# Patient Record
Sex: Male | Born: 1965 | Race: White | Hispanic: No | Marital: Single | Smoking: Former smoker
Health system: Southern US, Community
[De-identification: ages and names within clinical notes are randomized; demographics above are authoritative.]

## PROBLEM LIST (undated history)

## (undated) DIAGNOSIS — J449 Chronic obstructive pulmonary disease, unspecified: Secondary | ICD-10-CM

## (undated) DIAGNOSIS — R519 Headache, unspecified: Secondary | ICD-10-CM

## (undated) DIAGNOSIS — I10 Essential (primary) hypertension: Secondary | ICD-10-CM

## (undated) DIAGNOSIS — E785 Hyperlipidemia, unspecified: Secondary | ICD-10-CM

## (undated) DIAGNOSIS — K219 Gastro-esophageal reflux disease without esophagitis: Secondary | ICD-10-CM

## (undated) DIAGNOSIS — R0600 Dyspnea, unspecified: Secondary | ICD-10-CM

## (undated) DIAGNOSIS — G4730 Sleep apnea, unspecified: Secondary | ICD-10-CM

## (undated) HISTORY — PX: HERNIA REPAIR: SHX51

## (undated) HISTORY — PX: TONSILLECTOMY: SUR1361

---

## 2017-11-15 ENCOUNTER — Emergency Department (HOSPITAL_COMMUNITY)
Admission: EM | Admit: 2017-11-15 | Discharge: 2017-11-15 | Disposition: A | Discharge status: Home / Self Care | Payer: Medicaid Other | Attending: Emergency Medicine | Admitting: Emergency Medicine

## 2017-11-15 ENCOUNTER — Other Ambulatory Visit: Payer: Self-pay

## 2017-11-15 ENCOUNTER — Encounter (HOSPITAL_COMMUNITY): Payer: Self-pay

## 2017-11-15 ENCOUNTER — Emergency Department (HOSPITAL_COMMUNITY): Payer: Medicaid Other

## 2017-11-15 DIAGNOSIS — I10 Essential (primary) hypertension: Secondary | ICD-10-CM | POA: Diagnosis not present

## 2017-11-15 DIAGNOSIS — J449 Chronic obstructive pulmonary disease, unspecified: Secondary | ICD-10-CM | POA: Diagnosis not present

## 2017-11-15 DIAGNOSIS — R0602 Shortness of breath: Secondary | ICD-10-CM | POA: Diagnosis not present

## 2017-11-15 DIAGNOSIS — R51 Headache: Secondary | ICD-10-CM

## 2017-11-15 DIAGNOSIS — G8929 Other chronic pain: Secondary | ICD-10-CM

## 2017-11-15 DIAGNOSIS — Z79899 Other long term (current) drug therapy: Secondary | ICD-10-CM | POA: Insufficient documentation

## 2017-11-15 DIAGNOSIS — F17210 Nicotine dependence, cigarettes, uncomplicated: Secondary | ICD-10-CM | POA: Diagnosis not present

## 2017-11-15 HISTORY — DX: Chronic obstructive pulmonary disease, unspecified: J44.9

## 2017-11-15 HISTORY — DX: Sleep apnea, unspecified: G47.30

## 2017-11-15 HISTORY — DX: Essential (primary) hypertension: I10

## 2017-11-15 HISTORY — DX: Hyperlipidemia, unspecified: E78.5

## 2017-11-15 LAB — COMPREHENSIVE METABOLIC PANEL
ALBUMIN: 4.5 g/dL (ref 3.5–5.0)
ALT: 19 U/L (ref 0–44)
AST: 18 U/L (ref 15–41)
Alkaline Phosphatase: 69 U/L (ref 38–126)
Anion gap: 8 (ref 5–15)
BILIRUBIN TOTAL: 1.3 mg/dL — AB (ref 0.3–1.2)
BUN: 14 mg/dL (ref 6–20)
CO2: 24 mmol/L (ref 22–32)
Calcium: 9.1 mg/dL (ref 8.9–10.3)
Chloride: 108 mmol/L (ref 98–111)
Creatinine, Ser: 0.86 mg/dL (ref 0.61–1.24)
GFR calc Af Amer: >60 mL/min (ref >60)
GLUCOSE: 89 mg/dL (ref 70–99)
POTASSIUM: 3.5 mmol/L (ref 3.5–5.1)
Sodium: 140 mmol/L (ref 135–145)
TOTAL PROTEIN: 7.5 g/dL (ref 6.5–8.1)

## 2017-11-15 LAB — CBC WITH DIFFERENTIAL/PLATELET
ABS IMMATURE GRANULOCYTES: 0.01 10*3/uL (ref 0.00–0.07)
BASOS ABS: 0.1 10*3/uL (ref 0.0–0.1)
Basophils Relative: 1 %
EOS ABS: 0.1 10*3/uL (ref 0.0–0.5)
Eosinophils Relative: 1 %
HEMATOCRIT: 51.2 % (ref 39.0–52.0)
Hemoglobin: 17.4 g/dL — ABNORMAL HIGH (ref 13.0–17.0)
IMMATURE GRANULOCYTES: 0 %
LYMPHS ABS: 2 10*3/uL (ref 0.7–4.0)
LYMPHS PCT: 28 %
MCH: 30.4 pg (ref 26.0–34.0)
MCHC: 34 g/dL (ref 30.0–36.0)
MCV: 89.4 fL (ref 80.0–100.0)
MONOS PCT: 10 %
Monocytes Absolute: 0.7 10*3/uL (ref 0.1–1.0)
NEUTROS ABS: 4.3 10*3/uL (ref 1.7–7.7)
NEUTROS PCT: 60 %
NRBC: 0 % (ref 0.0–0.2)
Platelets: 250 10*3/uL (ref 150–400)
RBC: 5.73 MIL/uL (ref 4.22–5.81)
RDW: 12.4 % (ref 11.5–15.5)
WBC: 7.1 10*3/uL (ref 4.0–10.5)

## 2017-11-15 LAB — TROPONIN I: Troponin I: <0.03 ng/mL (ref <0.03)

## 2017-11-15 LAB — BRAIN NATRIURETIC PEPTIDE: B NATRIURETIC PEPTIDE 5: 29 pg/mL (ref 0.0–100.0)

## 2017-11-15 MED ORDER — ONDANSETRON HCL 4 MG/2ML IJ SOLN
INTRAMUSCULAR | Status: AC
  Administered 2017-11-15: 16:00:00
  Filled 2017-11-15: qty 2

## 2017-11-15 MED ORDER — LISINOPRIL 20 MG PO TABS: 20.0000 mg | ORAL_TABLET | Freq: Every evening | ORAL | 0 refills | Status: DC

## 2017-11-15 MED ORDER — METOPROLOL SUCCINATE ER 25 MG PO TB24: 25.0000 mg | ORAL_TABLET | Freq: Every day | ORAL | 0 refills | Status: DC

## 2017-11-15 MED ORDER — LABETALOL HCL 5 MG/ML IV SOLN
10.0000 mg | Freq: Once | INTRAVENOUS | Status: AC
  Administered 2017-11-15: 10 mg via INTRAVENOUS
  Filled 2017-11-15: qty 4

## 2017-11-15 MED ORDER — KETOROLAC TROMETHAMINE 30 MG/ML IJ SOLN: 60.0000 mg | Freq: Once | INTRAMUSCULAR | Status: DC

## 2017-11-15 MED ORDER — ONDANSETRON HCL 4 MG PO TABS: 4.0000 mg | ORAL_TABLET | Freq: Four times a day (QID) | ORAL | 0 refills | Status: DC | PRN

## 2017-11-15 MED ORDER — KETOROLAC TROMETHAMINE 60 MG/2ML IM SOLN
60.0000 mg | Freq: Once | INTRAMUSCULAR | Status: AC
  Administered 2017-11-15: 60 mg via INTRAMUSCULAR
  Filled 2017-11-15: qty 2

## 2017-11-15 NOTE — ED Provider Notes (Signed)
Asked to see the patient prior to being discharged as the patient felt like he still had a headache, still having some intermittent left leg pain and still worried that his blood pressure was high.  I had a long discussion with the patient and the family members regarding the etiology of his blood pressure being elevated including his noncompliance with medications for over a month, his 6 pack/day smoking history which is only recently improved to 1-1/2 packs/day, as well as his lifestyle.  He reports that he is chronically short of breath from his COPD which has not worsened, but he does not have any chest pain, his headache is intermittent but has been for weeks to months.  He is aware that his blood pressure is been high for quite some time.  The family members are asking why we are not admitting him to the hospital until his blood pressure is back to normal.  I had a long discussion with the family about the utilization of medications to slowly lower his blood pressure over time to reduce the risk of acute hypotension and hypoperfusion of the cerebrum.  They expressed her understanding as well as her understanding of the indications for return including signs or symptoms of stroke and heart attack.  He was given reassurance of his blood work and his work-up here today, he is given prescriptions for both lisinopril metoprolol and a nausea medication.  I have cautioned him against smoking alcohol use and anti-inflammatory use as they are pro hypertensive.  He again expressed his understanding is ambulatory and stable for discharge   Noemi Chapel, MD 11/15/17 1644

## 2017-11-15 NOTE — ED Notes (Signed)
Pt and family questioning discharge.  Reports bp still elevated and still c/o headache and left leg pain.  Notified pt's primary RN.
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
## 2017-11-15 NOTE — ED Triage Notes (Signed)
Pt reports that he went to clinic today for refill of BP meds. Pt has recently relocated and has not had BP meds since sept. Pt sent here due to BP 200/116 and was told he may have had a mild heart attack at some point

## 2018-02-14 ENCOUNTER — Telehealth: Payer: Self-pay

## 2018-02-14 ENCOUNTER — Institutional Professional Consult (permissible substitution): Payer: Medicaid Other | Admitting: Neurology

## 2018-02-14 NOTE — Telephone Encounter (Signed)
Pt did not show for their appt with Dr. Athar today.  

## 2018-02-19 ENCOUNTER — Encounter: Payer: Self-pay | Admitting: Neurology

## 2018-03-25 NOTE — Telephone Encounter (Signed)
Please follow dismissal protocol as per our No Show Policy for new pts.

## 2018-03-25 NOTE — Telephone Encounter (Signed)
Pt cancelled within 24 hours of his appt tomorrow. This counts as a no show. Pt has had 2 no shows this year. Per [facility] policy, pt meets dismissal criteria. Will send to Dr. Rexene Alberts for review.

## 2018-03-26 ENCOUNTER — Encounter: Payer: Self-pay | Admitting: Neurology

## 2018-03-26 ENCOUNTER — Institutional Professional Consult (permissible substitution): Payer: Medicaid Other | Admitting: Neurology

## 2018-12-16 ENCOUNTER — Other Ambulatory Visit (HOSPITAL_BASED_OUTPATIENT_CLINIC_OR_DEPARTMENT_OTHER): Payer: Self-pay

## 2018-12-16 DIAGNOSIS — I10 Essential (primary) hypertension: Secondary | ICD-10-CM

## 2018-12-16 DIAGNOSIS — G4733 Obstructive sleep apnea (adult) (pediatric): Secondary | ICD-10-CM

## 2018-12-16 DIAGNOSIS — G2581 Restless legs syndrome: Secondary | ICD-10-CM

## 2018-12-23 ENCOUNTER — Other Ambulatory Visit (HOSPITAL_COMMUNITY)
Admission: RE | Admit: 2018-12-23 | Discharge: 2018-12-23 | Disposition: A | Discharge status: Home / Self Care | Payer: Medicaid Other | Source: Ambulatory Visit | Attending: Neurology | Admitting: Neurology

## 2018-12-23 ENCOUNTER — Other Ambulatory Visit: Payer: Self-pay

## 2018-12-23 DIAGNOSIS — Z01812 Encounter for preprocedural laboratory examination: Secondary | ICD-10-CM | POA: Diagnosis present

## 2018-12-23 DIAGNOSIS — Z20828 Contact with and (suspected) exposure to other viral communicable diseases: Secondary | ICD-10-CM | POA: Insufficient documentation

## 2018-12-23 LAB — SARS CORONAVIRUS 2 (TAT 6-24 HRS): SARS Coronavirus 2: NEGATIVE

## 2018-12-25 ENCOUNTER — Other Ambulatory Visit: Payer: Self-pay

## 2018-12-25 ENCOUNTER — Ambulatory Visit: Payer: Medicaid Other | Attending: Neurology | Admitting: Neurology

## 2018-12-25 DIAGNOSIS — Z79899 Other long term (current) drug therapy: Secondary | ICD-10-CM | POA: Insufficient documentation

## 2018-12-25 DIAGNOSIS — R0683 Snoring: Secondary | ICD-10-CM | POA: Insufficient documentation

## 2018-12-25 DIAGNOSIS — G2581 Restless legs syndrome: Secondary | ICD-10-CM | POA: Diagnosis not present

## 2018-12-25 DIAGNOSIS — G4733 Obstructive sleep apnea (adult) (pediatric): Secondary | ICD-10-CM | POA: Diagnosis present

## 2018-12-25 DIAGNOSIS — I10 Essential (primary) hypertension: Secondary | ICD-10-CM | POA: Insufficient documentation

## 2018-12-31 ENCOUNTER — Telehealth: Payer: Self-pay | Admitting: General Practice

## 2018-12-31 NOTE — Telephone Encounter (Signed)
Negative COVID results given. Patient results "NOT Detected." Caller expressed understanding. ° °

## 2019-01-14 NOTE — Procedures (Signed)
[name], MD     [email or web address]             NOCTURNAL POLYSOMNOGRAPHY   LOCATION: [facility]   Patient Name: Jerry Aguirre, Jerry Aguirre Date: 12/25/2018 Gender: Male D.O.B: [birth year] Age (years): 51 Referring Provider: Phillips Odor MD, ABSM Height (inches): 64 Interpreting Physician: Phillips Odor MD, ABSM Weight (lbs): 163 RPSGT: Peak, Robert BMI: 28 MRN: [id number] Neck Size: 16.00 CLINICAL INFORMATION Sleep Study Type: NPSG     Indication for sleep study: Hypertension, Witnesses Apnea / Gasping During Sleep     Epworth Sleepiness Score: 1     SLEEP STUDY TECHNIQUE As per the AASM Manual for the Scoring of Sleep and Associated Events v2.3 (April 2016) with a hypopnea requiring 4% desaturations.  The channels recorded and monitored were frontal, central and occipital EEG, electrooculogram (EOG), submentalis EMG (chin), nasal and oral airflow, thoracic and abdominal wall motion, anterior tibialis EMG, snore microphone, electrocardiogram, and pulse oximetry.  MEDICATIONS Medications self-administered by patient taken the night of the study : N/A  Current Outpatient Medications:  .  albuterol (PROVENTIL HFA;VENTOLIN HFA) 108 (90 Base) MCG/ACT inhaler, Inhale 2 puffs into the lungs every 6 (six) hours as needed for wheezing or shortness of breath., Disp: , Rfl:  .  amLODipine (NORVASC) 10 MG tablet, Take 10 mg by mouth daily., Disp: , Rfl:  .  atorvastatin (LIPITOR) 20 MG tablet, Take 20 mg by mouth daily., Disp: , Rfl: 0 .  furosemide (LASIX) 40 MG tablet, Take 40 mg by mouth daily., Disp: , Rfl:  .  lisinopril (PRINIVIL,ZESTRIL) 20 MG tablet, Take 1 tablet (20 mg total) by mouth every evening., Disp: 30 tablet, Rfl: 0 .  meloxicam (MOBIC) 7.5 MG tablet, Take 7.5 mg by mouth 2 (two) times daily., Disp: , Rfl:  .  metoprolol succinate (TOPROL-XL) 25 MG 24 hr tablet, Take 1 tablet (25 mg total) by mouth at bedtime., Disp: 30  tablet, Rfl: 0 .  naproxen (NAPROSYN) 500 MG tablet, Take 500 mg by mouth 2 (two) times daily as needed. for pain, Disp: , Rfl: 0 .  omeprazole (PRILOSEC) 40 MG capsule, Take 40 mg by mouth daily., Disp: , Rfl: 0 .  ondansetron (ZOFRAN) 4 MG tablet, Take 1 tablet (4 mg total) by mouth every 6 (six) hours as needed., Disp: 12 tablet, Rfl: 0 .  pantoprazole (PROTONIX) 40 MG tablet, Take 40 mg by mouth daily., Disp: , Rfl:  .  triamterene-hydrochlorothiazide (DYAZIDE) 37.5-25 MG capsule, Take 1 capsule by mouth daily., Disp: , Rfl:      SLEEP ARCHITECTURE The study was initiated at 9:44:26 PM and ended at 4:52:36 AM.  Sleep onset time was 81.0 minutes and the sleep efficiency was 75.7%%. The total sleep time was 324 minutes.  Stage REM latency was 134.0 minutes.  The patient spent 7.3%% of the night in stage N1 sleep, 85.3%% in stage N2 sleep, 0.0%% in stage N3 and 7.4% in REM.  Alpha intrusion was absent.  Supine sleep was 49.23%.  RESPIRATORY PARAMETERS The overall apnea/hypopnea index (AHI) was 3.1 per hour. There were 0 total apneas, including 0 obstructive, 0 central and 0 mixed apneas. There were 17 hypopneas and 38 RERAs.  The AHI during Stage REM sleep was 2.5 per hour.  AHI while supine was 4.1 per hour.  The mean oxygen saturation was 89.1%. The minimum SpO2 during sleep was 76.0%.  loud snoring was noted during this study.  CARDIAC DATA The 2 lead  EKG demonstrated sinus rhythm. The mean heart rate was 69.6 beats per minute. Other EKG findings include: None. LEG MOVEMENT DATA The total PLMS were 0 with a resulting PLMS index of 0.0. Associated arousal with leg movement index was 0.0.  IMPRESSIONS 1. No significant obstructive sleep apnea occurred during this study. 2. No significant central sleep apnea occurred during this study. 3. Absent slow-wave sleep is documented.  Delano Metz, MD Diplomate, American Board of Sleep Medicine.  ELECTRONICALLY SIGNED ON:   01/14/2019, 6:57 AM [facility] PH: [phone]   FX: [phone] [place]

## 2019-03-06 DIAGNOSIS — R5383 Other fatigue: Secondary | ICD-10-CM | POA: Insufficient documentation

## 2019-05-01 DIAGNOSIS — G4700 Insomnia, unspecified: Secondary | ICD-10-CM | POA: Insufficient documentation

## 2019-08-20 ENCOUNTER — Other Ambulatory Visit (HOSPITAL_COMMUNITY): Payer: Self-pay | Admitting: Neurology

## 2019-08-20 DIAGNOSIS — M545 Low back pain, unspecified: Secondary | ICD-10-CM

## 2019-08-20 DIAGNOSIS — M25551 Pain in right hip: Secondary | ICD-10-CM | POA: Insufficient documentation

## 2019-08-20 DIAGNOSIS — M25561 Pain in right knee: Secondary | ICD-10-CM

## 2019-08-20 DIAGNOSIS — M25552 Pain in left hip: Secondary | ICD-10-CM

## 2019-08-20 DIAGNOSIS — M25562 Pain in left knee: Secondary | ICD-10-CM

## 2019-10-21 DIAGNOSIS — R2689 Other abnormalities of gait and mobility: Secondary | ICD-10-CM | POA: Insufficient documentation

## 2019-11-20 DIAGNOSIS — R600 Localized edema: Secondary | ICD-10-CM | POA: Insufficient documentation

## 2020-03-08 ENCOUNTER — Other Ambulatory Visit (HOSPITAL_COMMUNITY): Payer: Self-pay | Admitting: Neurology

## 2020-03-08 DIAGNOSIS — M25551 Pain in right hip: Secondary | ICD-10-CM

## 2020-03-15 ENCOUNTER — Ambulatory Visit (HOSPITAL_COMMUNITY)
Admission: RE | Admit: 2020-03-15 | Discharge: 2020-03-15 | Disposition: A | Discharge status: Home / Self Care | Payer: Medicaid Other | Source: Ambulatory Visit | Attending: Neurology | Admitting: Neurology

## 2020-03-15 ENCOUNTER — Other Ambulatory Visit: Payer: Self-pay

## 2020-03-15 ENCOUNTER — Other Ambulatory Visit (HOSPITAL_COMMUNITY): Payer: Self-pay | Admitting: Neurology

## 2020-03-15 DIAGNOSIS — M25551 Pain in right hip: Secondary | ICD-10-CM | POA: Insufficient documentation

## 2020-03-15 DIAGNOSIS — M25561 Pain in right knee: Secondary | ICD-10-CM | POA: Insufficient documentation

## 2020-03-15 DIAGNOSIS — M545 Low back pain, unspecified: Secondary | ICD-10-CM | POA: Diagnosis present

## 2020-03-15 DIAGNOSIS — M25552 Pain in left hip: Secondary | ICD-10-CM | POA: Insufficient documentation

## 2020-03-15 DIAGNOSIS — M25562 Pain in left knee: Secondary | ICD-10-CM | POA: Insufficient documentation

## 2020-04-08 ENCOUNTER — Other Ambulatory Visit (HOSPITAL_COMMUNITY): Payer: Self-pay | Admitting: Neurology

## 2020-04-08 DIAGNOSIS — R2689 Other abnormalities of gait and mobility: Secondary | ICD-10-CM

## 2020-04-09 ENCOUNTER — Other Ambulatory Visit (HOSPITAL_COMMUNITY): Payer: Self-pay | Admitting: Neurology

## 2020-04-09 DIAGNOSIS — M5000 Cervical disc disorder with myelopathy, unspecified cervical region: Secondary | ICD-10-CM

## 2020-04-25 DIAGNOSIS — M5136 Other intervertebral disc degeneration, lumbar region: Secondary | ICD-10-CM | POA: Insufficient documentation

## 2020-04-28 ENCOUNTER — Ambulatory Visit (HOSPITAL_COMMUNITY)
Admission: RE | Admit: 2020-04-28 | Discharge: 2020-04-28 | Disposition: A | Discharge status: Home / Self Care | Payer: Medicaid Other | Source: Ambulatory Visit | Attending: Neurology | Admitting: Neurology

## 2020-04-28 DIAGNOSIS — M5000 Cervical disc disorder with myelopathy, unspecified cervical region: Secondary | ICD-10-CM

## 2020-04-28 DIAGNOSIS — R2689 Other abnormalities of gait and mobility: Secondary | ICD-10-CM | POA: Insufficient documentation

## 2020-06-01 DIAGNOSIS — Z79891 Long term (current) use of opiate analgesic: Secondary | ICD-10-CM | POA: Insufficient documentation

## 2020-07-21 ENCOUNTER — Encounter (HOSPITAL_COMMUNITY): Payer: Self-pay

## 2020-07-21 ENCOUNTER — Observation Stay (HOSPITAL_COMMUNITY): Payer: Medicaid Other

## 2020-07-21 ENCOUNTER — Other Ambulatory Visit: Payer: Self-pay

## 2020-07-21 ENCOUNTER — Observation Stay (HOSPITAL_COMMUNITY)
Admission: EM | Admit: 2020-07-21 | Discharge: 2020-07-22 | Disposition: A | Discharge status: Home / Self Care | Payer: Medicaid Other | Attending: Internal Medicine | Admitting: Internal Medicine

## 2020-07-21 ENCOUNTER — Emergency Department (HOSPITAL_COMMUNITY): Payer: Medicaid Other

## 2020-07-21 DIAGNOSIS — R202 Paresthesia of skin: Secondary | ICD-10-CM

## 2020-07-21 DIAGNOSIS — R471 Dysarthria and anarthria: Secondary | ICD-10-CM

## 2020-07-21 DIAGNOSIS — Z20822 Contact with and (suspected) exposure to covid-19: Secondary | ICD-10-CM | POA: Diagnosis not present

## 2020-07-21 DIAGNOSIS — R404 Transient alteration of awareness: Secondary | ICD-10-CM

## 2020-07-21 DIAGNOSIS — R29818 Other symptoms and signs involving the nervous system: Secondary | ICD-10-CM | POA: Diagnosis not present

## 2020-07-21 DIAGNOSIS — F17210 Nicotine dependence, cigarettes, uncomplicated: Secondary | ICD-10-CM | POA: Insufficient documentation

## 2020-07-21 DIAGNOSIS — R7303 Prediabetes: Secondary | ICD-10-CM

## 2020-07-21 DIAGNOSIS — Z79899 Other long term (current) drug therapy: Secondary | ICD-10-CM | POA: Insufficient documentation

## 2020-07-21 DIAGNOSIS — I10 Essential (primary) hypertension: Secondary | ICD-10-CM

## 2020-07-21 DIAGNOSIS — J449 Chronic obstructive pulmonary disease, unspecified: Secondary | ICD-10-CM

## 2020-07-21 DIAGNOSIS — E7439 Other disorders of intestinal carbohydrate absorption: Secondary | ICD-10-CM

## 2020-07-21 DIAGNOSIS — E785 Hyperlipidemia, unspecified: Secondary | ICD-10-CM

## 2020-07-21 DIAGNOSIS — Y900 Blood alcohol level of less than 20 mg/100 ml: Secondary | ICD-10-CM | POA: Insufficient documentation

## 2020-07-21 DIAGNOSIS — K219 Gastro-esophageal reflux disease without esophagitis: Secondary | ICD-10-CM

## 2020-07-21 DIAGNOSIS — I639 Cerebral infarction, unspecified: Secondary | ICD-10-CM | POA: Diagnosis not present

## 2020-07-21 DIAGNOSIS — R4781 Slurred speech: Secondary | ICD-10-CM | POA: Diagnosis present

## 2020-07-21 LAB — I-STAT CHEM 8, ED
BUN: 17 mg/dL (ref 6–20)
Calcium, Ion: 1.24 mmol/L (ref 1.15–1.40)
Chloride: 101 mmol/L (ref 98–111)
Creatinine, Ser: 1.5 mg/dL — ABNORMAL HIGH (ref 0.61–1.24)
Glucose, Bld: 102 mg/dL — ABNORMAL HIGH (ref 70–99)
HCT: 50 % (ref 39.0–52.0)
Hemoglobin: 17 g/dL (ref 13.0–17.0)
Potassium: 3.8 mmol/L (ref 3.5–5.1)
Sodium: 140 mmol/L (ref 135–145)
TCO2: 31 mmol/L (ref 22–32)

## 2020-07-21 LAB — CBG MONITORING, ED: Glucose-Capillary: 115 mg/dL — ABNORMAL HIGH (ref 70–99)

## 2020-07-21 LAB — COMPREHENSIVE METABOLIC PANEL
ALT: 26 U/L (ref 0–44)
AST: 22 U/L (ref 15–41)
Albumin: 4.4 g/dL (ref 3.5–5.0)
Alkaline Phosphatase: 67 U/L (ref 38–126)
Anion gap: 12 (ref 5–15)
BUN: 18 mg/dL (ref 6–20)
CO2: 25 mmol/L (ref 22–32)
Calcium: 9.5 mg/dL (ref 8.9–10.3)
Chloride: 101 mmol/L (ref 98–111)
Creatinine, Ser: 1.28 mg/dL — ABNORMAL HIGH (ref 0.61–1.24)
GFR, Estimated: >60 mL/min (ref >60)
Glucose, Bld: 106 mg/dL — ABNORMAL HIGH (ref 70–99)
Potassium: 3.8 mmol/L (ref 3.5–5.1)
Sodium: 138 mmol/L (ref 135–145)
Total Bilirubin: 1.7 mg/dL — ABNORMAL HIGH (ref 0.3–1.2)
Total Protein: 7.8 g/dL (ref 6.5–8.1)

## 2020-07-21 LAB — CBC
HCT: 49.2 % (ref 39.0–52.0)
Hemoglobin: 16.7 g/dL (ref 13.0–17.0)
MCH: 31.3 pg (ref 26.0–34.0)
MCHC: 33.9 g/dL (ref 30.0–36.0)
MCV: 92.3 fL (ref 80.0–100.0)
Platelets: 228 10*3/uL (ref 150–400)
RBC: 5.33 MIL/uL (ref 4.22–5.81)
RDW: 12.7 % (ref 11.5–15.5)
WBC: 8.3 10*3/uL (ref 4.0–10.5)
nRBC: 0 % (ref 0.0–0.2)

## 2020-07-21 LAB — DIFFERENTIAL
Abs Immature Granulocytes: 0.02 10*3/uL (ref 0.00–0.07)
Basophils Absolute: 0.1 10*3/uL (ref 0.0–0.1)
Basophils Relative: 1 %
Eosinophils Absolute: 0.2 10*3/uL (ref 0.0–0.5)
Eosinophils Relative: 2 %
Immature Granulocytes: 0 %
Lymphocytes Relative: 24 %
Lymphs Abs: 2 10*3/uL (ref 0.7–4.0)
Monocytes Absolute: 0.8 10*3/uL (ref 0.1–1.0)
Monocytes Relative: 10 %
Neutro Abs: 5.2 10*3/uL (ref 1.7–7.7)
Neutrophils Relative %: 63 %

## 2020-07-21 LAB — PROTIME-INR
INR: 0.9 (ref 0.8–1.2)
Prothrombin Time: 11.9 seconds (ref 11.4–15.2)

## 2020-07-21 LAB — RESP PANEL BY RT-PCR (FLU A&B, COVID) ARPGX2
Influenza A by PCR: NEGATIVE
Influenza B by PCR: NEGATIVE
SARS Coronavirus 2 by RT PCR: NEGATIVE

## 2020-07-21 LAB — APTT: aPTT: 28 seconds (ref 24–36)

## 2020-07-21 LAB — ETHANOL: Alcohol, Ethyl (B): <10 mg/dL (ref <10)

## 2020-07-21 MED ORDER — IPRATROPIUM-ALBUTEROL 0.5-2.5 (3) MG/3ML IN SOLN: 3.0000 mL | RESPIRATORY_TRACT | Status: DC | PRN

## 2020-07-21 MED ORDER — ALBUTEROL SULFATE (2.5 MG/3ML) 0.083% IN NEBU: 3.0000 mL | INHALATION_SOLUTION | Freq: Four times a day (QID) | RESPIRATORY_TRACT | Status: DC | PRN

## 2020-07-21 MED ORDER — IOHEXOL 350 MG/ML SOLN
100.0000 mL | Freq: Once | INTRAVENOUS | Status: AC | PRN
  Administered 2020-07-21: 100 mL via INTRAVENOUS

## 2020-07-21 MED ORDER — SODIUM CHLORIDE 0.9 % IV SOLN: INTRAVENOUS | Status: DC

## 2020-07-21 MED ORDER — ACETAMINOPHEN 160 MG/5ML PO SOLN: 650.0000 mg | ORAL | Status: DC | PRN

## 2020-07-21 MED ORDER — ATORVASTATIN CALCIUM 20 MG PO TABS
20.0000 mg | ORAL_TABLET | Freq: Every day | ORAL | Status: DC
  Administered 2020-07-21: 20 mg via ORAL
  Filled 2020-07-21: qty 1

## 2020-07-21 MED ORDER — SENNOSIDES-DOCUSATE SODIUM 8.6-50 MG PO TABS: 1.0000 | ORAL_TABLET | Freq: Every evening | ORAL | Status: DC | PRN

## 2020-07-21 MED ORDER — HEPARIN SODIUM (PORCINE) 5000 UNIT/ML IJ SOLN
5000.0000 [IU] | Freq: Three times a day (TID) | INTRAMUSCULAR | Status: DC
  Administered 2020-07-21 – 2020-07-22 (×3): 5000 [IU] via SUBCUTANEOUS
  Filled 2020-07-21 (×2): qty 1

## 2020-07-21 MED ORDER — STROKE: EARLY STAGES OF RECOVERY BOOK: Freq: Once | Status: DC

## 2020-07-21 MED ORDER — ACETAMINOPHEN 650 MG RE SUPP: 650.0000 mg | RECTAL | Status: DC | PRN

## 2020-07-21 MED ORDER — ACETAMINOPHEN 325 MG PO TABS: 650.0000 mg | ORAL_TABLET | ORAL | Status: DC | PRN

## 2020-07-21 MED ORDER — PANTOPRAZOLE SODIUM 40 MG PO TBEC
40.0000 mg | DELAYED_RELEASE_TABLET | Freq: Every day | ORAL | Status: DC
  Administered 2020-07-21 – 2020-07-22 (×2): 40 mg via ORAL
  Filled 2020-07-21 (×2): qty 1

## 2020-07-21 MED ORDER — ASPIRIN 81 MG PO CHEW
324.0000 mg | CHEWABLE_TABLET | Freq: Once | ORAL | Status: AC
  Administered 2020-07-21: 324 mg via ORAL
  Filled 2020-07-21: qty 4

## 2020-07-21 MED ORDER — ASPIRIN EC 81 MG PO TBEC
81.0000 mg | DELAYED_RELEASE_TABLET | Freq: Every day | ORAL | Status: DC
  Administered 2020-07-22: 81 mg via ORAL
  Filled 2020-07-21: qty 1
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
## 2020-07-21 NOTE — ED Notes (Signed)
Pt given a urine at this time.
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
## 2020-07-21 NOTE — ED Triage Notes (Signed)
Pt to er room number 18 via ems, per ems pt is here for some facial droop and fatigue, states that family found him slumped over and was hard to wake up.  Pt states that his nephew came to see him and thought that he had a facial droop, pt states that nothing is wrong, he just feels tired .

## 2020-07-22 ENCOUNTER — Observation Stay (HOSPITAL_COMMUNITY): Payer: Medicaid Other

## 2020-07-22 ENCOUNTER — Observation Stay (HOSPITAL_BASED_OUTPATIENT_CLINIC_OR_DEPARTMENT_OTHER): Payer: Medicaid Other

## 2020-07-22 DIAGNOSIS — E785 Hyperlipidemia, unspecified: Secondary | ICD-10-CM | POA: Diagnosis not present

## 2020-07-22 DIAGNOSIS — G459 Transient cerebral ischemic attack, unspecified: Secondary | ICD-10-CM | POA: Diagnosis not present

## 2020-07-22 DIAGNOSIS — I10 Essential (primary) hypertension: Secondary | ICD-10-CM | POA: Diagnosis not present

## 2020-07-22 DIAGNOSIS — E7439 Other disorders of intestinal carbohydrate absorption: Secondary | ICD-10-CM

## 2020-07-22 DIAGNOSIS — R7303 Prediabetes: Secondary | ICD-10-CM

## 2020-07-22 DIAGNOSIS — J449 Chronic obstructive pulmonary disease, unspecified: Secondary | ICD-10-CM | POA: Diagnosis not present

## 2020-07-22 LAB — HEMOGLOBIN A1C
Hgb A1c MFr Bld: 6.1 % — ABNORMAL HIGH (ref 4.8–5.6)
Mean Plasma Glucose: 128.37 mg/dL

## 2020-07-22 LAB — ECHOCARDIOGRAM COMPLETE
AR max vel: 2.85 cm2
AV Area VTI: 2.62 cm2
AV Area mean vel: 2.68 cm2
AV Mean grad: 4 mmHg
AV Peak grad: 6.7 mmHg
Ao pk vel: 1.29 m/s
Area-P 1/2: 3.2 cm2
Height: 67 in
MV VTI: 3.43 cm2
S' Lateral: 3.47 cm
Weight: 2663.16 oz

## 2020-07-22 LAB — LIPID PANEL
Cholesterol: 181 mg/dL (ref 0–200)
HDL: 31 mg/dL — ABNORMAL LOW (ref >40)
LDL Cholesterol: 116 mg/dL — ABNORMAL HIGH (ref 0–99)
Total CHOL/HDL Ratio: 5.8 RATIO
Triglycerides: 169 mg/dL — ABNORMAL HIGH (ref <150)
VLDL: 34 mg/dL (ref 0–40)

## 2020-07-22 LAB — HIV ANTIBODY (ROUTINE TESTING W REFLEX): HIV Screen 4th Generation wRfx: NONREACTIVE

## 2020-07-22 MED ORDER — ATORVASTATIN CALCIUM 40 MG PO TABS: 40.0000 mg | ORAL_TABLET | Freq: Every day | ORAL | 0 refills | Status: DC

## 2020-07-22 MED ORDER — HYDROCODONE-ACETAMINOPHEN 10-325 MG PO TABS
1.0000 | ORAL_TABLET | Freq: Two times a day (BID) | ORAL | Status: DC
  Administered 2020-07-22: 1 via ORAL
  Filled 2020-07-22: qty 1

## 2020-07-22 MED ORDER — ASPIRIN 81 MG PO TBEC: 81.0000 mg | DELAYED_RELEASE_TABLET | Freq: Every day | ORAL | 11 refills | Status: DC

## 2020-07-22 MED ORDER — BACLOFEN 10 MG PO TABS: 10.0000 mg | ORAL_TABLET | Freq: Two times a day (BID) | ORAL | Status: DC | PRN

## 2020-07-22 MED ORDER — IOHEXOL 350 MG/ML SOLN
75.0000 mL | Freq: Once | INTRAVENOUS | Status: AC | PRN
  Administered 2020-07-22: 75 mL via INTRAVENOUS

## 2020-07-22 MED ORDER — ATORVASTATIN CALCIUM 40 MG PO TABS: 40.0000 mg | ORAL_TABLET | Freq: Every day | ORAL | Status: DC

## 2020-07-22 MED ORDER — PREGABALIN 75 MG PO CAPS: 150.0000 mg | ORAL_CAPSULE | Freq: Every day | ORAL | Status: DC

## 2020-07-22 NOTE — Discharge Summary (Signed)
Physician Discharge Summary  Jerry Aguirre ZYS:[id number] DOB: 03/30/1965 DOA: 07/21/2020  PCP: Sandria Manly [place] date: 09/18/2353 Discharge date: 07/22/2020  Admitted From: Home Disposition: Home  Recommendations for Outpatient Follow-up:  Follow-up on lipids, A1c   Please refer patient to neurolog for a follow up visit  Home Health:  none needed  Discharge Condition:  stable   CODE STATUS:  full code   Consultations: Tele neurology  Procedures/Studies: See below   Discharge Diagnoses:  Principal Problem:   Focal neurological deficit Active Problems:   COPD (chronic obstructive pulmonary disease) ([place])   GERD (gastroesophageal reflux disease)   Essential hypertension   Dyslipidemia   Glucose intolerance     Brief Summary: This is a 55 year old male with COPD, ongoing tobacco abuse, hypertension, hyperlipidemia, GERD who was brought into the ED via EMS for facial droop and fatigue.  Family found him slumped over and it was hard to wake him up.  The patient stated that his nephew came to see him and thought that the patient had a facial droop.  The patient claimed that nothing was wrong and he only was tired. Code stroke was called and the patient was evaluated neurology via telemetry.  Hospital Course:  Neurological deficits- TIA? - Prior ED telemetry neurology note, patient had left-sided sensory deficit, drift in bilateral upper and left lower extremity and dysarthria of unclear chronicity.  He was not a tPA candidate as he was outside the window - on my exam today, he had a right facial droop - Head CT without contrast CTA head and neck showed mild chronic vessel changes of the cerebral hemispheric white matter and no occlusion or stenosis - MRI performed today is negative -2D echo not reveal a thrombus-see report below - LDL is 116, HDL 31, triglycerides 169-Lipitor increased from 20 mg to 40 mg daily - Hemoglobin A1c 6.1-carb modified diet  education has been given-no medication started for this - no neurologist present at [facility] today. Plan discussed with neurohospitalist at [facility] - Aspirin 325 mg given yesterday an 81 mg daily should be continued - Nicotine should be discontinued - No PT follow up recommended  Hypertension - Continue amlodipine, lisinopril, metoprolol, triamterene/HCTZ while checking creatinine intermittently for renal failure  .bmi Discharge Exam: Vitals:   07/22/20 1309 07/22/20 1700  BP: (!) 144/85 (!) 148/84  Pulse: 66 63  Resp: 18 18  Temp: 98.2 F (36.8 C) 97.8 F (36.6 C)  SpO2: 97% 97%   Vitals:   07/22/20 0600 07/22/20 0851 07/22/20 1309 07/22/20 1700  BP: 108/75 118/75 (!) 144/85 (!) 148/84  Pulse: 60 66 66 63  Resp: 15 18 18 18   Temp: 97.9 F (36.6 C) 97.6 F (36.4 C) 98.2 F (36.8 C) 97.8 F (36.6 C)  TempSrc: Oral Oral Oral Oral  SpO2:  95% 97% 97%  Weight:      Height:        General: Pt is alert, awake, not in acute distress Cardiovascular: RRR, S1/S2 +, no rubs, no gallops Respiratory: CTA bilaterally, no wheezing, no rhonchi Abdominal: Soft, NT, ND, bowel sounds + Extremities: no edema, no cyanosis   Discharge Instructions  Discharge Instructions     Diet - low sodium heart healthy   Complete by: As directed    Diet Carb Modified   Complete by: As directed    Increase activity slowly   Complete by: As directed       Allergies as of 07/22/2020  Reactions   Lisinopril    Lopressor [metoprolol Tartrate]         Medication List     TAKE these medications    albuterol 108 (90 Base) MCG/ACT inhaler Commonly known as: VENTOLIN HFA Inhale 2 puffs into the lungs every 6 (six) hours as needed for wheezing or shortness of breath.   amLODipine 10 MG tablet Commonly known as: NORVASC Take 10 mg by mouth daily.   aspirin 81 MG EC tablet Take 1 tablet (81 mg total) by mouth daily. Swallow whole. Start taking on: July 23, 2020   atorvastatin 40 MG  tablet Commonly known as: LIPITOR Take 1 tablet (40 mg total) by mouth daily at 6 PM. What changed:  medication strength how much to take when to take this   baclofen 10 MG tablet Commonly known as: LIORESAL baclofen 10 mg tablet  Take 1 tablet twice a day by oral route as needed.   cetirizine 10 MG tablet Commonly known as: ZYRTEC Take 10 mg by mouth daily as needed.   docusate sodium 100 MG capsule Commonly known as: COLACE Take 100 mg by mouth 2 (two) times daily.   HYDROcodone-acetaminophen 10-325 MG tablet Commonly known as: NORCO hydrocodone 10 mg-acetaminophen 325 mg tablet  Take 1 tablet twice a day by oral route.   lisinopril 20 MG tablet Commonly known as: ZESTRIL Take 1 tablet (20 mg total) by mouth every evening.   meloxicam 7.5 MG tablet Commonly known as: MOBIC Take 7.5 mg by mouth 2 (two) times daily.   metoprolol succinate 25 MG 24 hr tablet Commonly known as: TOPROL-XL Take 1 tablet (25 mg total) by mouth at bedtime.   omeprazole 40 MG capsule Commonly known as: PRILOSEC Take 40 mg by mouth 2 (two) times daily.   pantoprazole 40 MG tablet Commonly known as: PROTONIX Take 40 mg by mouth daily.   pregabalin 150 MG capsule Commonly known as: LYRICA Lyrica 150 mg capsule  Take 1 capsule every day by oral route at bedtime.   Symbicort 80-4.5 MCG/ACT inhaler Generic drug: budesonide-formoterol Inhale 2 puffs into the lungs daily.   triamterene-hydrochlorothiazide 37.5-25 MG capsule Commonly known as: DYAZIDE Take 1 capsule by mouth daily.        Allergies  Allergen Reactions   Lisinopril    Lopressor [Metoprolol Tartrate]       MR BRAIN WO CONTRAST  Result Date: 07/21/2020 CLINICAL DATA:  Slurred speech, facial droop, and left arm drift. EXAM: MRI HEAD WITHOUT CONTRAST TECHNIQUE: Multiplanar, multiecho pulse sequences of the brain and surrounding structures were obtained without intravenous contrast. COMPARISON:  Head CT and CTA  07/21/2020.  Head MRI 04/28/2020. FINDINGS: Brain: There is no evidence of an acute infarct, intracranial hemorrhage, mass, midline shift, or extra-axial fluid collection. Patchy T2 hyperintensities in the cerebral white matter bilaterally and in the pons are unchanged the prior MRI and are nonspecific but compatible with mild-to-moderate chronic small vessel ischemic disease. The ventricles and sulci are within normal limits for age. Vascular: Major intracranial vascular flow voids are preserved. Skull and upper cervical spine: No suspicious marrow lesion. Sinuses/Orbits: Unremarkable orbits. Minimal bilateral ethmoid air cell mucosal thickening. No significant mastoid fluid. Other: None. IMPRESSION: 1. No acute intracranial abnormality. 2. Mild-to-moderate chronic small vessel ischemic disease. Electronically Signed   By: Logan Bores M.D.   On: 07/21/2020 18:11   ECHOCARDIOGRAM COMPLETE  Result Date: 07/22/2020    ECHOCARDIOGRAM REPORT   Patient Name:   Penny Pia Date of Exam:  07/22/2020 Medical Rec #:  [id number]      Height:       67.0 in Accession #:    [id number]     Weight:       166.4 lb Date of Birth:  Aug 10, 1965      BSA:          1.870 m Patient Age:    54 years       BP:           108/78 mmHg Patient Gender: M              HR:           60 bpm. Exam Location:  [facility] Procedure: 2D Echo, Cardiac Doppler and Color Doppler Indications:    TIA  History:        Patient has no prior history of Echocardiogram examinations.                 COPD; Risk Factors:Hypertension and Current Smoker.  Sonographer:    Wenda Low Referring Phys: [place]  1. Left ventricular ejection fraction, by estimation, is 55 to 60%. The left ventricle has normal function. The left ventricle has no regional wall motion abnormalities. Left ventricular diastolic parameters were normal.  2. Right ventricular systolic function is normal. The right ventricular size is normal.  3. The mitral valve  is normal in structure. Trivial mitral valve regurgitation.  4. The aortic valve is normal in structure. Aortic valve regurgitation is not visualized.  5. The inferior vena cava is normal in size with greater than 50% respiratory variability, suggesting right atrial pressure of 3 mmHg. FINDINGS  Left Ventricle: Left ventricular ejection fraction, by estimation, is 55 to 60%. The left ventricle has normal function. The left ventricle has no regional wall motion abnormalities. The left ventricular internal cavity size was normal in size. There is  no left ventricular hypertrophy. Left ventricular diastolic parameters were normal. Right Ventricle: The right ventricular size is normal. Right vetricular wall thickness was not assessed. Right ventricular systolic function is normal. Left Atrium: Left atrial size was normal in size. Right Atrium: Right atrial size was normal in size. Pericardium: There is no evidence of pericardial effusion. Mitral Valve: The mitral valve is normal in structure. Trivial mitral valve regurgitation. MV peak gradient, 2.8 mmHg. The mean mitral valve gradient is 1.0 mmHg. Tricuspid Valve: The tricuspid valve is normal in structure. Tricuspid valve regurgitation is trivial. Aortic Valve: The aortic valve is normal in structure. Aortic valve regurgitation is not visualized. Aortic valve mean gradient measures 4.0 mmHg. Aortic valve peak gradient measures 6.7 mmHg. Aortic valve area, by VTI measures 2.62 cm. Pulmonic Valve: The pulmonic valve was not well visualized. Pulmonic valve regurgitation is not visualized. Aorta: The aortic root is normal in size and structure. Venous: The inferior vena cava is normal in size with greater than 50% respiratory variability, suggesting right atrial pressure of 3 mmHg. IAS/Shunts: No atrial level shunt detected by color flow Doppler.  LEFT VENTRICLE PLAX 2D LVIDd:         5.16 cm  Diastology LVIDs:         3.47 cm  LV e' medial:    8.67 cm/s LV PW:          1.14 cm  LV E/e' medial:  8.3 LV IVS:        1.05 cm  LV e' lateral:   11.00 cm/s LVOT diam:  2.00 cm  LV E/e' lateral: 6.5 LV SV:         81 LV SV Index:   44 LVOT Area:     3.14 cm  RIGHT VENTRICLE RV Basal diam:  4.14 cm RV Mid diam:    3.98 cm RV S prime:     12.40 cm/s TAPSE (M-mode): 2.7 cm LEFT ATRIUM             Index       RIGHT ATRIUM           Index LA diam:        3.20 cm 1.71 cm/m  RA Area:     16.10 cm LA Vol (A2C):   41.9 ml 22.40 ml/m RA Volume:   46.90 ml  25.07 ml/m LA Vol (A4C):   34.3 ml 18.34 ml/m LA Biplane Vol: 38.0 ml 20.32 ml/m  AORTIC VALVE AV Area (Vmax):    2.85 cm AV Area (Vmean):   2.68 cm AV Area (VTI):     2.62 cm AV Vmax:           129.00 cm/s AV Vmean:          93.000 cm/s AV VTI:            0.311 m AV Peak Grad:      6.7 mmHg AV Mean Grad:      4.0 mmHg LVOT Vmax:         117.00 cm/s LVOT Vmean:        79.300 cm/s LVOT VTI:          0.259 m LVOT/AV VTI ratio: 0.83  AORTA Ao Root diam: 3.30 cm MITRAL VALVE MV Area (PHT): 3.20 cm    SHUNTS MV Area VTI:   3.43 cm    Systemic VTI:  0.26 m MV Peak grad:  2.8 mmHg    Systemic Diam: 2.00 cm MV Mean grad:  1.0 mmHg MV Vmax:       0.84 m/s MV Vmean:      41.9 cm/s MV Decel Time: 237 msec MV E velocity: 72.00 cm/s MV A velocity: 58.70 cm/s MV E/A ratio:  1.23 Dorris Carnes MD Electronically signed by Dorris Carnes MD Signature Date/Time: 07/22/2020/5:16:29 PM    Final    CT HEAD CODE STROKE WO CONTRAST  Result Date: 07/21/2020 CLINICAL DATA:  Code stroke.  Left-sided weakness and facial droop. EXAM: CT HEAD WITHOUT CONTRAST TECHNIQUE: Contiguous axial images were obtained from the base of the skull through the vertex without intravenous contrast. COMPARISON:  None. FINDINGS: Brain: There are minimal small vessel changes of the cerebral hemispheric white matter. No evidence of acute infarction, mass lesion, hemorrhage, hydrocephalus or extra-axial collection. Vascular: No abnormal vascular finding. Skull: Normal Sinuses/Orbits:  Clear/normal Other: None ASPECTS ([place] Stroke Program Early CT Score) - Ganglionic level infarction (caudate, lentiform nuclei, internal capsule, insula, M1-M3 cortex): 7 - Supraganglionic infarction (M4-M6 cortex): 3 Total score (0-10 with 10 being normal): 10 IMPRESSION: 1. No acute finding. Mild chronic small-vessel change of the cerebral hemispheric white matter. 2. ASPECTS is 10. 3. These results were called by telephone at the time of interpretation on 07/21/2020 at 3:26 pm to provider [facility] , who verbally acknowledged these results. Electronically Signed   By: Nelson Chimes M.D.   On: 07/21/2020 15:28   CT ANGIO HEAD NECK W WO CM W PERF (CODE STROKE)  Result Date: 07/21/2020 CLINICAL DATA:  Left-sided weakness, code stroke follow-up EXAM: CT ANGIOGRAPHY HEAD  AND NECK CT PERFUSION BRAIN TECHNIQUE: Multidetector CT imaging of the head and neck was performed using the standard protocol during bolus administration of intravenous contrast. Multiplanar CT image reconstructions and MIPs were obtained to evaluate the vascular anatomy. Carotid stenosis measurements (when applicable) are obtained utilizing NASCET criteria, using the distal internal carotid diameter as the denominator. Multiphase CT imaging of the brain was performed following IV bolus contrast injection. Subsequent parametric perfusion maps were calculated using RAPID software. CONTRAST:  169mL OMNIPAQUE IOHEXOL 350 MG/ML SOLN COMPARISON:  None. FINDINGS: CTA NECK Aortic arch: Minimal mixed plaque along the aortic arch. Great vessel origins are patent. Right carotid system: Patent. Trace calcified plaque along the common carotid. Minimal mixed plaque along the proximal internal carotid without stenosis. Left carotid system: Patent.  No stenosis. Vertebral arteries: Patent.  Right vertebral artery is dominant. Skeleton: No significant abnormality. Other neck: No mass or adenopathy. Upper chest: Emphysema. Review of the MIP images confirms the  above findings CTA HEAD Anterior circulation: Intracranial internal carotid arteries are patent with minimal calcified plaque. Anterior cerebral arteries are patent. Right A1 ACA is dominant. Middle cerebral arteries are patent. Posterior circulation: Intracranial vertebral arteries are patent. Basilar artery is patent. Major cerebellar artery origins are patent. Posterior cerebral arteries are patent. Venous sinuses: Patent as allowed by contrast bolus timing. Review of the MIP images confirms the above findings CT Brain Perfusion Findings: CBF (<30%) Volume: 34mL Perfusion (Tmax>6.0s) volume: 5mL Mismatch Volume: 39mL Infarction Location: None. IMPRESSION: No large vessel occlusion, hemodynamically significant stenosis, or evidence of dissection. Perfusion imaging demonstrates no evidence of core infarction or penumbra. Electronically Signed   By: Macy Mis M.D.   On: 07/21/2020 15:48     The results of significant diagnostics from this hospitalization (including imaging, microbiology, ancillary and laboratory) are listed below for reference.     Microbiology: Recent Results (from the past 240 hour(s))  Resp Panel by RT-PCR (Flu A&B, Covid) Nasopharyngeal Swab     Status: None   Collection Time: 07/21/20  4:45 PM   Specimen: Nasopharyngeal Swab; Nasopharyngeal(NP) swabs in vial transport medium  Result Value Ref Range Status   SARS Coronavirus 2 by RT PCR NEGATIVE NEGATIVE Final    Comment: (NOTE) SARS-CoV-2 target nucleic acids are NOT DETECTED.  The SARS-CoV-2 RNA is generally detectable in upper respiratory specimens during the acute phase of infection. The lowest concentration of SARS-CoV-2 viral copies this assay can detect is 138 copies/mL. A negative result does not preclude SARS-Cov-2 infection and should not be used as the sole basis for treatment or other patient management decisions. A negative result may occur with  improper specimen collection/handling, submission of specimen  other than nasopharyngeal swab, presence of viral mutation(s) within the areas targeted by this assay, and inadequate number of viral copies(<138 copies/mL). A negative result must be combined with clinical observations, patient history, and epidemiological information. The expected result is Negative.  Fact Sheet for Patients:  EntrepreneurPulse.com.au  Fact Sheet for Healthcare Providers:  IncredibleEmployment.be  This test is no t yet approved or cleared by the [place] FDA and  has been authorized for detection and/or diagnosis of SARS-CoV-2 by FDA under an Emergency Use Authorization (EUA). This EUA will remain  in effect (meaning this test can be used) for the duration of the COVID-19 declaration under Section 564(b)(1) of the Act, 21 U.S.C.section 360bbb-3(b)(1), unless the authorization is terminated  or revoked sooner.       Influenza A by PCR NEGATIVE NEGATIVE Final  Influenza B by PCR NEGATIVE NEGATIVE Final    Comment: (NOTE) The Xpert Xpress SARS-CoV-2/FLU/RSV plus assay is intended as an aid in the diagnosis of influenza from Nasopharyngeal swab specimens and should not be used as a sole basis for treatment. Nasal washings and aspirates are unacceptable for Xpert Xpress SARS-CoV-2/FLU/RSV testing.  Fact Sheet for Patients: EntrepreneurPulse.com.au  Fact Sheet for Healthcare Providers: IncredibleEmployment.be  This test is not yet approved or cleared by the [place] FDA and has been authorized for detection and/or diagnosis of SARS-CoV-2 by FDA under an Emergency Use Authorization (EUA). This EUA will remain in effect (meaning this test can be used) for the duration of the COVID-19 declaration under Section 564(b)(1) of the Act, 21 U.S.C. section 360bbb-3(b)(1), unless the authorization is terminated or revoked.  Performed at [facility], [address], [place], [place]  [address]      Labs: BNP (last 3 results) No results for input(s): BNP in the last 8760 hours. Basic Metabolic Panel: Recent Labs  Lab 07/21/20 1514 07/21/20 1523  NA 138 140  K 3.8 3.8  CL 101 101  CO2 25  --   GLUCOSE 106* 102*  BUN 18 17  CREATININE 1.28* 1.50*  CALCIUM 9.5  --    Liver Function Tests: Recent Labs  Lab 07/21/20 1514  AST 22  ALT 26  ALKPHOS 67  BILITOT 1.7*  PROT 7.8  ALBUMIN 4.4   No results for input(s): LIPASE, AMYLASE in the last 168 hours. No results for input(s): AMMONIA in the last 168 hours. CBC: Recent Labs  Lab 07/21/20 1514 07/21/20 1523  WBC 8.3  --   NEUTROABS 5.2  --   HGB 16.7 17.0  HCT 49.2 50.0  MCV 92.3  --   PLT 228  --    Cardiac Enzymes: No results for input(s): CKTOTAL, CKMB, CKMBINDEX, TROPONINI in the last 168 hours. BNP: Invalid input(s): POCBNP CBG: Recent Labs  Lab 07/21/20 1510  GLUCAP 115*   D-Dimer No results for input(s): DDIMER in the last 72 hours. Hgb A1c Recent Labs    07/22/20 0604  HGBA1C 6.1*   Lipid Profile Recent Labs    07/22/20 0604  CHOL 181  HDL 31*  LDLCALC 116*  TRIG 169*  CHOLHDL 5.8   Thyroid function studies No results for input(s): TSH, T4TOTAL, T3FREE, THYROIDAB in the last 72 hours.  Invalid input(s): FREET3 Anemia work up No results for input(s): VITAMINB12, FOLATE, FERRITIN, TIBC, IRON, RETICCTPCT in the last 72 hours. Urinalysis No results found for: COLORURINE, APPEARANCEUR, [place], [place], GLUCOSEU, [place], [place], [place], PROTEINUR, UROBILINOGEN, NITRITE, LEUKOCYTESUR Sepsis Labs Invalid input(s): PROCALCITONIN,  WBC,  LACTICIDVEN Microbiology Recent Results (from the past 240 hour(s))  Resp Panel by RT-PCR (Flu A&B, Covid) Nasopharyngeal Swab     Status: None   Collection Time: 07/21/20  4:45 PM   Specimen: Nasopharyngeal Swab; Nasopharyngeal(NP) swabs in vial transport medium  Result Value Ref Range Status   SARS Coronavirus 2 by RT PCR  NEGATIVE NEGATIVE Final    Comment: (NOTE) SARS-CoV-2 target nucleic acids are NOT DETECTED.  The SARS-CoV-2 RNA is generally detectable in upper respiratory specimens during the acute phase of infection. The lowest concentration of SARS-CoV-2 viral copies this assay can detect is 138 copies/mL. A negative result does not preclude SARS-Cov-2 infection and should not be used as the sole basis for treatment or other patient management decisions. A negative result may occur with  improper specimen collection/handling, submission of specimen other than nasopharyngeal  swab, presence of viral mutation(s) within the areas targeted by this assay, and inadequate number of viral copies(<138 copies/mL). A negative result must be combined with clinical observations, patient history, and epidemiological information. The expected result is Negative.  Fact Sheet for Patients:  EntrepreneurPulse.com.au  Fact Sheet for Healthcare Providers:  IncredibleEmployment.be  This test is no t yet approved or cleared by the [place] FDA and  has been authorized for detection and/or diagnosis of SARS-CoV-2 by FDA under an Emergency Use Authorization (EUA). This EUA will remain  in effect (meaning this test can be used) for the duration of the COVID-19 declaration under Section 564(b)(1) of the Act, 21 U.S.C.section 360bbb-3(b)(1), unless the authorization is terminated  or revoked sooner.       Influenza A by PCR NEGATIVE NEGATIVE Final   Influenza B by PCR NEGATIVE NEGATIVE Final    Comment: (NOTE) The Xpert Xpress SARS-CoV-2/FLU/RSV plus assay is intended as an aid in the diagnosis of influenza from Nasopharyngeal swab specimens and should not be used as a sole basis for treatment. Nasal washings and aspirates are unacceptable for Xpert Xpress SARS-CoV-2/FLU/RSV testing.  Fact Sheet for Patients: EntrepreneurPulse.com.au  Fact Sheet for  Healthcare Providers: IncredibleEmployment.be  This test is not yet approved or cleared by the [place] FDA and has been authorized for detection and/or diagnosis of SARS-CoV-2 by FDA under an Emergency Use Authorization (EUA). This EUA will remain in effect (meaning this test can be used) for the duration of the COVID-19 declaration under Section 564(b)(1) of the Act, 21 U.S.C. section 360bbb-3(b)(1), unless the authorization is terminated or revoked.  Performed at [facility], [address] [place], [place] [phone]      Time coordinating discharge in minutes: 65  SIGNED:   Debbe Odea, MD  [facility] 07/22/2020, 5:58 PM

## 2020-07-22 NOTE — Progress Notes (Signed)
  Echocardiogram 2D Echocardiogram has been performed.  Jerry Aguirre 07/22/2020, 11:02 AM
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
## 2020-08-30 ENCOUNTER — Encounter: Payer: Self-pay | Admitting: Neurology

## 2020-11-22 NOTE — Progress Notes (Signed)
NEUROLOGY CONSULTATION NOTE  Jerry Aguirre MRN: [id number] DOB: May 24, 1965  Referring provider: Barton Fanny, NP Primary care provider: [facility]  Reason for consult:  bilateral leg weakness, pain, falls  Assessment/Plan:   Chronic back and leg pain Bilateral leg weakness Frequent falls Neuropathy Restless leg syndrome Possible TIA over the summer, managed by his primary neurologist  His presentation is consistent with underlying peripheral neuropathy.  NCV-EMG from March was overall unremarkable, raising possibility of a small fiber neuropathy.  The weakness may be related to pain.  There is no obvious findings on brain and spinal cord to account for this.    Repeat NCV-EMG lower extremities Check B12, TSH, ferritin Further recommendations pending results.   Subjective:  Jerry Aguirre is a 55 year old male with COPD, HTN and HLD who presents for lower extremity weakness and pain.  History supplemented by hospital records and referring provider's notes.  He has history of chronic back and leg pain.  He endorses pain consistent with sciatica.  He also has numbness in the feet up the legs.  He feels unsteady on his feet.  Sometimes his knees will suddenly give out on him without warning, causing him to fall.  He also has restless leg.  X-rays of bilateral hips and knees from 03/15/2020 were negative.  X-ray of lumbar spine showed mild degenerative facet disease.  NCV-EMG of lower extremities on 04/08/2020 did not reveal any obvious neuropathy  MRI of brain without contrast on 04/28/2020 showed chronic small vessel ischemic changes in the bilateral cerebral white matter and pons.  MRI of cervical spine showed multilevel cervical spondylosis with mild spinal stenosis at C3-4, moderate left greater than right foraminal stenosis at C4, C5, and C6, and moderate right foraminal stenosis at C7 with no evidence of cord deformity.  He is being treated at [facility]  for his pain.     Of note, he was admitted to [facility] on 07/21/2020 for suspected stroke.  He was found slumped over and unresponsive by family.  He was noted to have a right facial droop.  Patient claimed that he was just tired.  MRI of brain personally reviewed showed no acute stroke.  CTA head and neck showed no LVO or hemodynamically significant stenosis.  Etiology unclear but TIA suspected.  PAST MEDICAL HISTORY: Past Medical History:  Diagnosis Date   COPD (chronic obstructive pulmonary disease) (HCC)    COPD (chronic obstructive pulmonary disease) (HCC)    Hyperlipidemia    Hypertension    Sleep apnea     PAST SURGICAL HISTORY: Past Surgical History:  Procedure Laterality Date   HERNIA REPAIR      MEDICATIONS: Current Outpatient Medications on File Prior to Visit  Medication Sig Dispense Refill   albuterol (PROVENTIL HFA;VENTOLIN HFA) 108 (90 Base) MCG/ACT inhaler Inhale 2 puffs into the lungs every 6 (six) hours as needed for wheezing or shortness of breath.     amLODipine (NORVASC) 10 MG tablet Take 10 mg by mouth daily.     aspirin EC 81 MG EC tablet Take 1 tablet (81 mg total) by mouth daily. Swallow whole. 30 tablet 11   atorvastatin (LIPITOR) 40 MG tablet Take 1 tablet (40 mg total) by mouth daily at 6 PM. 30 tablet 0   baclofen (LIORESAL) 10 MG tablet baclofen 10 mg tablet  Take 1 tablet twice a day by oral route as needed.     cetirizine (ZYRTEC) 10 MG tablet Take 10 mg by  mouth daily as needed.     docusate sodium (COLACE) 100 MG capsule Take 100 mg by mouth 2 (two) times daily.     HYDROcodone-acetaminophen (NORCO) 10-325 MG tablet hydrocodone 10 mg-acetaminophen 325 mg tablet  Take 1 tablet twice a day by oral route.     lisinopril (PRINIVIL,ZESTRIL) 20 MG tablet Take 1 tablet (20 mg total) by mouth every evening. 30 tablet 0   meloxicam (MOBIC) 7.5 MG tablet Take 7.5 mg by mouth 2 (two) times daily.     metoprolol succinate (TOPROL-XL) 25 MG 24 hr  tablet Take 1 tablet (25 mg total) by mouth at bedtime. 30 tablet 0   omeprazole (PRILOSEC) 40 MG capsule Take 40 mg by mouth 2 (two) times daily.  0   pantoprazole (PROTONIX) 40 MG tablet Take 40 mg by mouth daily.     pregabalin (LYRICA) 150 MG capsule Lyrica 150 mg capsule  Take 1 capsule every day by oral route at bedtime.     SYMBICORT 80-4.5 MCG/ACT inhaler Inhale 2 puffs into the lungs daily.     triamterene-hydrochlorothiazide (DYAZIDE) 37.5-25 MG capsule Take 1 capsule by mouth daily.     No current facility-administered medications on file prior to visit.    ALLERGIES: Allergies  Allergen Reactions   Lisinopril    Lopressor [Metoprolol Tartrate]     FAMILY HISTORY: No family history on file.  Objective:  Blood pressure 134/83, pulse 79, height 5\' 3"  (1.6 m), weight 179 lb (81.2 kg), SpO2 94 %. General: No acute distress.  Patient appears well-groomed.   Head:  Normocephalic/atraumatic Eyes:  fundi examined but not visualized Neck: supple, no paraspinal tenderness, full range of motion Back: No paraspinal tenderness Heart: regular rate and rhythm Lungs: Clear to auscultation bilaterally. Vascular: No carotid bruits. Neurological Exam: Mental status: alert and oriented to person, place, and time, recent and remote memory intact, fund of knowledge intact, attention and concentration intact, speech fluent and not dysarthric, language intact. Cranial nerves: CN I: not tested CN II: pupils equal, round and reactive to light, visual fields intact CN III, IV, VI:  full range of motion, no nystagmus, no ptosis CN V: facial sensation intact. CN VII: upper and lower face symmetric CN VIII: hearing intact CN IX, X: gag intact, uvula midline CN XI: sternocleidomastoid and trapezius muscles intact CN XII: tongue midline Bulk & Tone: normal, no fasciculations. Motor:  muscle strength 5/5 throughout Sensation:  Pinprick sensation reduced up to above the knees.  Vibratory  sensation reduced in toes.   Deep Tendon Reflexes:  2+ throughout,  toes downgoing.   Finger to nose testing:  Without dysmetria.   Heel to shin:  Without dysmetria.   Gait:  Mildly broad-based antalgic gait.  Able to turn.  Unable to tandem walk.  Romberg negative.    Thank you for allowing me to take part in the care of this patient.  Metta Clines, DO  CC:  Barton Fanny, NP  [facility]

## 2020-11-23 ENCOUNTER — Encounter: Payer: Self-pay | Admitting: Neurology

## 2020-11-23 ENCOUNTER — Ambulatory Visit (INDEPENDENT_AMBULATORY_CARE_PROVIDER_SITE_OTHER): Payer: Medicaid Other | Admitting: Neurology

## 2020-11-23 ENCOUNTER — Other Ambulatory Visit: Payer: Self-pay

## 2020-11-23 ENCOUNTER — Ambulatory Visit: Payer: Medicaid Other | Admitting: Neurology

## 2020-11-23 VITALS — BP 134/83 | HR 79 | Ht 63.0 in | Wt 179.0 lb

## 2020-11-23 DIAGNOSIS — G2581 Restless legs syndrome: Secondary | ICD-10-CM

## 2020-11-23 DIAGNOSIS — G629 Polyneuropathy, unspecified: Secondary | ICD-10-CM

## 2020-11-23 DIAGNOSIS — R29898 Other symptoms and signs involving the musculoskeletal system: Secondary | ICD-10-CM | POA: Diagnosis not present

## 2020-11-23 DIAGNOSIS — G8929 Other chronic pain: Secondary | ICD-10-CM

## 2020-11-23 NOTE — Patient Instructions (Signed)
Repeat nerve study of both legs Check B12, TSH, ferritin

## 2020-12-23 ENCOUNTER — Other Ambulatory Visit (HOSPITAL_COMMUNITY)
Admission: RE | Admit: 2020-12-23 | Discharge: 2020-12-23 | Disposition: A | Discharge status: Home / Self Care | Payer: Medicaid Other | Source: Ambulatory Visit | Attending: Neurology | Admitting: Neurology

## 2020-12-23 ENCOUNTER — Other Ambulatory Visit: Payer: Self-pay

## 2020-12-23 ENCOUNTER — Ambulatory Visit (INDEPENDENT_AMBULATORY_CARE_PROVIDER_SITE_OTHER): Payer: Medicaid Other | Admitting: Neurology

## 2020-12-23 DIAGNOSIS — M79605 Pain in left leg: Secondary | ICD-10-CM

## 2020-12-23 DIAGNOSIS — R29898 Other symptoms and signs involving the musculoskeletal system: Secondary | ICD-10-CM | POA: Diagnosis present

## 2020-12-23 DIAGNOSIS — G629 Polyneuropathy, unspecified: Secondary | ICD-10-CM | POA: Diagnosis not present

## 2020-12-23 DIAGNOSIS — G2581 Restless legs syndrome: Secondary | ICD-10-CM | POA: Diagnosis present

## 2020-12-23 DIAGNOSIS — M79604 Pain in right leg: Secondary | ICD-10-CM

## 2020-12-23 LAB — VITAMIN B12: Vitamin B-12: 150 pg/mL — ABNORMAL LOW (ref 180–914)

## 2020-12-23 LAB — FERRITIN: Ferritin: 64 ng/mL (ref 24–336)

## 2020-12-23 LAB — TSH: TSH: 1.582 u[IU]/mL (ref 0.350–4.500)

## 2020-12-23 NOTE — Procedures (Signed)
[facility]  [address], [address]  [place], [place] [address] Tel: [phone] Fax:  [phone] Test Date:  12/23/2020  Patient: Jerry Aguirre DOB: 12-Apr-1965 Physician: Narda Amber, DO  Sex: Male Height: 5\' 3"  Ref Phys: Metta Clines, D.O.  ID#: [id number]   Technician:    Patient Complaints: This is a 55 year old man referred for evaluation of bilateral leg pain.  NCV & EMG Findings: Electrodiagnostic testing of the right lower extremity and additional studies of the left shows: Bilateral sural and superficial peroneal sensory responses are within normal limits. Bilateral peroneal and tibial motor responses are within normal limits. Bilateral tibial H reflex studies are within normal limits. There is no evidence of active or chronic motor axonal changes affecting any of the tested muscles.  Motor unit configuration and recruitment pattern is within normal limits.  Impression: This is a normal study of the lower extremities.  In particular, there is no evidence of a large fiber sensorimotor polyneuropathy or lumbosacral radiculopathy.   ___________________________ Narda Amber, DO    Nerve Conduction Studies Anti Sensory Summary Table   Stim Site NR Peak (ms) Norm Peak (ms) P-T Amp (V) Norm P-T Amp  Left Sup Peroneal Anti Sensory (Ant Lat Mall)  32C  12 cm    2.0 <4.6 8.2 >4  Right Sup Peroneal Anti Sensory (Ant Lat Mall)  32C  12 cm    2.8 <4.6 7.2 >4  Left Sural Anti Sensory (Lat Mall)  32C  Calf    2.7 <4.6 9.0 >4  Right Sural Anti Sensory (Lat Mall)  32C  Calf    2.1 <4.6 8.8 >4   Motor Summary Table   Stim Site NR Onset (ms) Norm Onset (ms) O-P Amp (mV) Norm O-P Amp Site1 Site2 Delta-0 (ms) Dist (cm) Vel (m/s) Norm Vel (m/s)  Left Peroneal Motor (Ext Dig Brev)  32C  Ankle    4.5 <6.0 2.6 >2.5 B Fib Ankle 7.6 35.0 46 >40  B Fib    12.1  2.5  Poplt B Fib 1.6 7.0 44 >40  Poplt    13.7  2.5         Right Peroneal Motor (Ext Dig Brev)  32C   Ankle    3.8 <6.0 3.5 >2.5 B Fib Ankle 8.0 35.0 44 >40  B Fib    11.8  2.9  Poplt B Fib 1.8 8.0 44 >40  Poplt    13.6  2.7         Left Tibial Motor (Abd Hall Brev)  32C  Ankle    3.0 <6.0 12.0 >4 Knee Ankle 10.0 42.0 42 >40  Knee    13.0  8.9         Right Tibial Motor (Abd Hall Brev)  32C  Ankle    2.8 <6.0 12.2 >4 Knee Ankle 9.4 39.0 41 >40  Knee    12.2  7.9          H Reflex Studies   NR H-Lat (ms) Lat Norm (ms) L-R H-Lat (ms)  Left Tibial (Gastroc)  32C     34.63 <35 0.95  Right Tibial (Gastroc)  32C     34.69 <35 0.95   EMG   Side Muscle Ins Act Fibs Psw Fasc Number Recrt Dur Dur. Amp Amp. Poly Poly. Comment  Right AntTibialis Nml Nml Nml Nml Nml Nml Nml Nml Nml Nml Nml Nml N/A  Right Gastroc Nml Nml Nml Nml Nml Nml Nml Nml Nml Nml Nml  Nml N/A  Right Flex Dig Long Nml Nml Nml Nml Nml Nml Nml Nml Nml Nml Nml Nml N/A  Right RectFemoris Nml Nml Nml Nml Nml Nml Nml Nml Nml Nml Nml Nml N/A  Right GluteusMed Nml Nml Nml Nml Nml Nml Nml Nml Nml Nml Nml Nml N/A  Left AntTibialis Nml Nml Nml Nml Nml Nml Nml Nml Nml Nml Nml Nml N/A  Left Gastroc Nml Nml Nml Nml Nml Nml Nml Nml Nml Nml Nml Nml N/A  Left Flex Dig Long Nml Nml Nml Nml Nml Nml Nml Nml Nml Nml Nml Nml N/A  Left RectFemoris Nml Nml Nml Nml Nml Nml Nml Nml Nml Nml Nml Nml N/A  Left GluteusMed Nml Nml Nml Nml Nml Nml Nml Nml Nml Nml Nml Nml N/A      Waveforms:

## 2020-12-24 ENCOUNTER — Telehealth: Payer: Self-pay

## 2020-12-24 DIAGNOSIS — R29898 Other symptoms and signs involving the musculoskeletal system: Secondary | ICD-10-CM

## 2020-12-24 DIAGNOSIS — M79605 Pain in left leg: Secondary | ICD-10-CM

## 2020-12-24 DIAGNOSIS — M79604 Pain in right leg: Secondary | ICD-10-CM

## 2020-12-24 NOTE — Telephone Encounter (Signed)
-----   Message from Pieter Partridge, DO sent at 12/24/2020  6:44 AM EST ----- Nerve study is normal.  I would like to order MRI of lumbar spine without contrast to evaluate for lumbar stenosis.

## 2020-12-24 NOTE — Telephone Encounter (Signed)
Tried calling pt, No answer. The phone just rung.  New Order for Lumbar Spine W/O Contrast.

## 2020-12-27 NOTE — Progress Notes (Signed)
Tried calling patient no answer. Unable to LVM the phone kept ringing.

## 2020-12-28 NOTE — Telephone Encounter (Signed)
Spoke with his sister Margarito Courser. She wrote down the following message from Dr. Tomi Likens:  B12 level is low.  Recommend starting OTC B12 1014mcg daily.

## 2021-01-15 ENCOUNTER — Other Ambulatory Visit: Payer: Medicaid Other

## 2021-01-26 ENCOUNTER — Ambulatory Visit
Admission: RE | Admit: 2021-01-26 | Discharge: 2021-01-26 | Disposition: A | Discharge status: Home / Self Care | Payer: Medicaid Other | Source: Ambulatory Visit | Attending: Neurology | Admitting: Neurology

## 2021-01-26 ENCOUNTER — Other Ambulatory Visit: Payer: Self-pay

## 2021-01-26 DIAGNOSIS — M79604 Pain in right leg: Secondary | ICD-10-CM

## 2021-01-26 DIAGNOSIS — R29898 Other symptoms and signs involving the musculoskeletal system: Secondary | ICD-10-CM

## 2021-01-27 ENCOUNTER — Telehealth: Payer: Self-pay

## 2021-01-27 DIAGNOSIS — M79604 Pain in right leg: Secondary | ICD-10-CM

## 2021-01-27 DIAGNOSIS — R29898 Other symptoms and signs involving the musculoskeletal system: Secondary | ICD-10-CM

## 2021-01-27 NOTE — Telephone Encounter (Signed)
-----   Message from Pieter Partridge, DO sent at 01/27/2021  7:28 AM EST ----- MRI does reveal areas in the lower back that could be irritating nerves to cause leg pain.  I would recommend referral to neurosurgery for their opinion.

## 2021-01-27 NOTE — Telephone Encounter (Signed)
Tried calling patient no answer. LMOVM  Referral added.

## 2021-01-28 ENCOUNTER — Telehealth: Payer: Self-pay | Admitting: Neurology

## 2021-01-28 NOTE — Telephone Encounter (Signed)
Patient sister advised of his MRI results. And referral added.

## 2021-01-28 NOTE — Telephone Encounter (Signed)
Patients sister called to get results for Jerry Aguirre. Margarito Courser said call her cause Leven cant hear [phone]

## 2021-01-28 NOTE — Telephone Encounter (Signed)
Patient was calling to get the results of the MRI.

## 2021-01-28 NOTE — Telephone Encounter (Signed)
Patient advised of his MRI results.   Neurosurgery referral added

## 2021-03-25 ENCOUNTER — Telehealth: Payer: Self-pay | Admitting: Neurology

## 2021-03-25 NOTE — Telephone Encounter (Signed)
Pt sister was given the number to [place] [facility]  ?

## 2021-03-25 NOTE — Telephone Encounter (Signed)
Patient's sister Margarito Courser called requesting a call back about a referral to a neurosurgeon, they still have not heard anything. ?

## 2021-03-25 NOTE — Telephone Encounter (Signed)
Patient's sister called back and said [facility] does not have the referral that was sent, please resend. ?

## 2021-03-25 NOTE — Telephone Encounter (Signed)
Referral was refaxed to [facility] pt sister called an informed it was faxed  ?

## 2021-04-05 ENCOUNTER — Other Ambulatory Visit: Payer: Self-pay | Admitting: Neurosurgery

## 2021-04-13 NOTE — Pre-Procedure Instructions (Signed)
Surgical Instructions ? ? ? Your procedure is scheduled on Tuesday, April 4th. ? Report to [facility] Main Entrance "A" at 11:15 A.M., then check in with the Admitting office. ? Call this number if you have problems the morning of surgery: ? [phone] ? ? If you have any questions prior to your surgery date call [phone]: Open Monday-Friday 8am-4pm ? ? ? Remember: ? Do not eat or drink after midnight the night before your surgery ? ? ? Take these medicines the morning of surgery with A SIP OF WATER  ?baclofen (LIORESAL) ?cetirizine (ZYRTEC)  ?HYDROcodone-acetaminophen ([place]) ?[facility])  ?pantoprazole (PROTONIX) ?pramipexole (MIRAPEX) ?SYMBICORT 80-4.5 MCG/ACT inhaler ? ? ?If needed: ?albuterol (PROVENTIL HFA;VENTOLIN HFA) inhaler- bring on day of surgery if needed ? ? ?Follow your surgeon's instructions on when to stop Aspirin.  If no instructions were given by your surgeon then you will need to call the office to get those instructions.    ? ?As of today, STOP taking any Aspirin (unless otherwise instructed by your surgeon) Aleve, Naproxen, Ibuprofen, Motrin, Advil, Goody's, BC's, all herbal medications, fish oil, and all vitamins. This includes your meloxicam (MOBIC). ?         ?           ?Do NOT Smoke (Tobacco/Vaping) for 24 hours prior to your procedure. ? ?If you use a CPAP at night, you may bring your mask/headgear for your overnight stay. ?  ?Contacts, glasses, piercing's, hearing aid's, dentures or partials may not be worn into surgery, please bring cases for these belongings.  ?  ?For patients admitted to the hospital, discharge time will be determined by your treatment team. ?  ?Patients discharged the day of surgery will not be allowed to drive home, and someone needs to stay with them for 24 hours. ? ?SURGICAL WAITING ROOM VISITATION ?Patients having surgery or a procedure may have two support people in the waiting room. These visitors may be switched out with other visitors if  needed. ?Children under the age of 24 must have an adult accompany them who is not the patient. ?If the patient needs to stay at the hospital during part of their recovery, the visitor guidelines for inpatient rooms apply. ? ?Please refer to the [facility] website for the visitor guidelines for Inpatients (after your surgery is over and you are in a regular room).  ? ? ?Special instructions:   ?[facility]- Preparing For Surgery ? ?Before surgery, you can play an important role. Because skin is not sterile, your skin needs to be as free of germs as possible. You can reduce the number of germs on your skin by washing with CHG (chlorahexidine gluconate) Soap before surgery.  CHG is an antiseptic cleaner which kills germs and bonds with the skin to continue killing germs even after washing.   ? ?Oral Hygiene is also important to reduce your risk of infection.  Remember - BRUSH YOUR TEETH THE MORNING OF SURGERY WITH YOUR REGULAR TOOTHPASTE ? ?Please do not use if you have an allergy to CHG or antibacterial soaps. If your skin becomes reddened/irritated stop using the CHG.  ?Do not shave (including legs and underarms) for at least 48 hours prior to first CHG shower. It is OK to shave your face. ? ?Please follow these instructions carefully. ?  ?Shower the NIGHT BEFORE SURGERY and the MORNING OF SURGERY ? ?If you chose to wash your hair, wash your hair first as usual with your normal shampoo. ? ?After you shampoo,  rinse your hair and body thoroughly to remove the shampoo. ? ?Use CHG Soap as you would any other liquid soap. You can apply CHG directly to the skin and wash gently with a scrungie or a clean washcloth.  ? ?Apply the CHG Soap to your body ONLY FROM THE NECK DOWN.  Do not use on open wounds or open sores. Avoid contact with your eyes, ears, mouth and genitals (private parts). Wash Face and genitals (private parts)  with your normal soap.  ? ?Wash thoroughly, paying special attention to the area where your  surgery will be performed. ? ?Thoroughly rinse your body with warm water from the neck down. ? ?DO NOT shower/wash with your normal soap after using and rinsing off the CHG Soap. ? ?Pat yourself dry with a CLEAN TOWEL. ? ?Wear CLEAN PAJAMAS to bed the night before surgery ? ?Place CLEAN SHEETS on your bed the night before your surgery ? ?DO NOT SLEEP WITH PETS. ? ? ?Day of Surgery: ?Take a shower with CHG soap. ?Do not wear jewelry  ?Do not wear lotions, powders, colognes, or deodorant. ? Men may shave face and neck. ?Do not bring valuables to the hospital.  ?[facility] is not responsible for any belongings or valuables. ? ?Wear Clean/Comfortable clothing the morning of surgery ?Remember to brush your teeth WITH YOUR REGULAR TOOTHPASTE. ?  ?Please read over the following fact sheets that you were given. ? ? ? ?If you received a COVID test during your pre-op visit  it is requested that you wear a mask when out in public, stay away from anyone that may not be feeling well and notify your surgeon if you develop symptoms. If you have been in contact with anyone that has tested positive in the last 10 days please notify you surgeon.  ?

## 2021-04-14 ENCOUNTER — Other Ambulatory Visit: Payer: Self-pay

## 2021-04-14 ENCOUNTER — Encounter (HOSPITAL_COMMUNITY)
Admission: RE | Admit: 2021-04-14 | Discharge: 2021-04-14 | Disposition: A | Discharge status: Home / Self Care | Payer: Medicaid Other | Source: Ambulatory Visit | Attending: Neurosurgery | Admitting: Neurosurgery

## 2021-04-14 ENCOUNTER — Encounter (HOSPITAL_COMMUNITY): Payer: Self-pay

## 2021-04-14 VITALS — BP 113/83 | HR 62 | Temp 97.7°F | Resp 17 | Ht 64.0 in | Wt 189.6 lb

## 2021-04-14 DIAGNOSIS — Z01812 Encounter for preprocedural laboratory examination: Secondary | ICD-10-CM | POA: Diagnosis present

## 2021-04-14 DIAGNOSIS — Z01818 Encounter for other preprocedural examination: Secondary | ICD-10-CM

## 2021-04-14 DIAGNOSIS — I10 Essential (primary) hypertension: Secondary | ICD-10-CM | POA: Insufficient documentation

## 2021-04-14 HISTORY — DX: Gastro-esophageal reflux disease without esophagitis: K21.9

## 2021-04-14 HISTORY — DX: Headache, unspecified: R51.9

## 2021-04-14 LAB — CBC
HCT: 50.4 % (ref 39.0–52.0)
Hemoglobin: 16.8 g/dL (ref 13.0–17.0)
MCH: 30.4 pg (ref 26.0–34.0)
MCHC: 33.3 g/dL (ref 30.0–36.0)
MCV: 91.3 fL (ref 80.0–100.0)
Platelets: 247 10*3/uL (ref 150–400)
RBC: 5.52 MIL/uL (ref 4.22–5.81)
RDW: 13.1 % (ref 11.5–15.5)
WBC: 7.2 10*3/uL (ref 4.0–10.5)
nRBC: 0 % (ref 0.0–0.2)

## 2021-04-14 LAB — BASIC METABOLIC PANEL
Anion gap: 8 (ref 5–15)
BUN: 22 mg/dL — ABNORMAL HIGH (ref 6–20)
CO2: 30 mmol/L (ref 22–32)
Calcium: 9.8 mg/dL (ref 8.9–10.3)
Chloride: 103 mmol/L (ref 98–111)
Creatinine, Ser: 1.22 mg/dL (ref 0.61–1.24)
GFR, Estimated: >60 mL/min (ref >60)
Glucose, Bld: 117 mg/dL — ABNORMAL HIGH (ref 70–99)
Potassium: 4.3 mmol/L (ref 3.5–5.1)
Sodium: 141 mmol/L (ref 135–145)

## 2021-04-14 LAB — SURGICAL PCR SCREEN
MRSA, PCR: NEGATIVE
Staphylococcus aureus: NEGATIVE

## 2021-04-14 NOTE — Progress Notes (Signed)
PCP - [facility] Dept ?Cardiologist - denies ? ?PPM/ICD - denies ? ? ?Chest x-ray - 11/15/17 ?EKG - 07/22/20 ?Stress Test - 15 + years ago per pt in [place], normal per pt ?ECHO - 07/22/20 ?Cardiac Cath - denies ? ?Sleep Study - 2022, pt states OSA was mild and they said he didn't require a CPAP ?CPAP - no ? ?DM- denies ? ?Blood Thinner Instructions: n/a ?Aspirin Instructions: pt states he will not start taking ASA until after surgery. He has not been taking it. ? ?ERAS Protcol - no, NPO ? ? ?COVID TEST- n/a ? ? ?Anesthesia review: no ? ?Patient denies shortness of breath, fever, cough and chest pain at PAT appointment ? ? ?All instructions explained to the patient, with a verbal understanding of the material. Patient agrees to go over the instructions while at home for a better understanding. Patient also instructed to notify surgeon if any contact is made with COVID+ person or if he develops any symptoms. The opportunity to ask questions was provided. ?  ?

## 2021-04-19 ENCOUNTER — Other Ambulatory Visit: Payer: Self-pay | Admitting: Neurosurgery

## 2021-04-21 ENCOUNTER — Observation Stay (HOSPITAL_COMMUNITY)
Admission: RE | Admit: 2021-04-21 | Discharge: 2021-04-22 | Disposition: A | Discharge status: Home / Self Care | Payer: Medicaid Other | Attending: Neurosurgery | Admitting: Neurosurgery

## 2021-04-21 ENCOUNTER — Other Ambulatory Visit: Payer: Self-pay

## 2021-04-21 ENCOUNTER — Encounter (HOSPITAL_COMMUNITY): Payer: Self-pay

## 2021-04-21 ENCOUNTER — Encounter (HOSPITAL_COMMUNITY)
Admission: RE | Disposition: A | Discharge status: Home / Self Care | Payer: Self-pay | Source: Home / Self Care | Attending: Neurosurgery

## 2021-04-21 ENCOUNTER — Ambulatory Visit (HOSPITAL_BASED_OUTPATIENT_CLINIC_OR_DEPARTMENT_OTHER): Payer: Medicaid Other | Admitting: Anesthesiology

## 2021-04-21 ENCOUNTER — Ambulatory Visit (HOSPITAL_COMMUNITY): Payer: Medicaid Other | Admitting: Anesthesiology

## 2021-04-21 ENCOUNTER — Ambulatory Visit (HOSPITAL_COMMUNITY): Payer: Medicaid Other

## 2021-04-21 DIAGNOSIS — Z79899 Other long term (current) drug therapy: Secondary | ICD-10-CM | POA: Insufficient documentation

## 2021-04-21 DIAGNOSIS — G4730 Sleep apnea, unspecified: Secondary | ICD-10-CM | POA: Diagnosis not present

## 2021-04-21 DIAGNOSIS — I10 Essential (primary) hypertension: Secondary | ICD-10-CM

## 2021-04-21 DIAGNOSIS — J449 Chronic obstructive pulmonary disease, unspecified: Secondary | ICD-10-CM

## 2021-04-21 DIAGNOSIS — M48061 Spinal stenosis, lumbar region without neurogenic claudication: Secondary | ICD-10-CM | POA: Diagnosis present

## 2021-04-21 DIAGNOSIS — M48062 Spinal stenosis, lumbar region with neurogenic claudication: Secondary | ICD-10-CM | POA: Diagnosis not present

## 2021-04-21 DIAGNOSIS — F17210 Nicotine dependence, cigarettes, uncomplicated: Secondary | ICD-10-CM | POA: Diagnosis not present

## 2021-04-21 DIAGNOSIS — Z9889 Other specified postprocedural states: Secondary | ICD-10-CM

## 2021-04-21 HISTORY — PX: LUMBAR LAMINECTOMY/DECOMPRESSION MICRODISCECTOMY: SHX5026

## 2021-04-21 SURGERY — LUMBAR LAMINECTOMY/DECOMPRESSION MICRODISCECTOMY 1 LEVEL
Anesthesia: General

## 2021-04-21 MED ORDER — PRAMIPEXOLE DIHYDROCHLORIDE 0.25 MG PO TABS
1.5000 mg | ORAL_TABLET | Freq: Every day | ORAL | Status: DC
  Administered 2021-04-21: 1.5 mg via ORAL

## 2021-04-21 MED ORDER — MIDAZOLAM HCL 2 MG/2ML IJ SOLN
INTRAMUSCULAR | Status: DC | PRN
  Administered 2021-04-21: 2 mg via INTRAVENOUS

## 2021-04-21 MED ORDER — MENTHOL 3 MG MT LOZG: 1.0000 | LOZENGE | OROMUCOSAL | Status: DC | PRN

## 2021-04-21 MED ORDER — OXYCODONE HCL 5 MG/5ML PO SOLN: 5.0000 mg | Freq: Once | ORAL | Status: DC | PRN

## 2021-04-21 MED ORDER — METHYLPREDNISOLONE ACETATE 80 MG/ML IJ SUSP
INTRAMUSCULAR | Status: AC
  Filled 2021-04-21: qty 1

## 2021-04-21 MED ORDER — SODIUM CHLORIDE 0.9% FLUSH: 3.0000 mL | INTRAVENOUS | Status: DC | PRN

## 2021-04-21 MED ORDER — THROMBIN 5000 UNITS EX SOLR
CUTANEOUS | Status: AC
  Filled 2021-04-21: qty 5000

## 2021-04-21 MED ORDER — ONDANSETRON HCL 4 MG/2ML IJ SOLN
4.0000 mg | Freq: Four times a day (QID) | INTRAMUSCULAR | Status: DC | PRN
  Administered 2021-04-21: 4 mg via INTRAVENOUS
  Filled 2021-04-21: qty 2

## 2021-04-21 MED ORDER — CEFAZOLIN SODIUM-DEXTROSE 1-4 GM/50ML-% IV SOLN
1.0000 g | Freq: Three times a day (TID) | INTRAVENOUS | Status: DC
  Administered 2021-04-21 – 2021-04-22 (×2): 1 g via INTRAVENOUS
  Filled 2021-04-21 (×2): qty 50

## 2021-04-21 MED ORDER — MIDAZOLAM HCL 2 MG/2ML IJ SOLN: 0.5000 mg | Freq: Once | INTRAMUSCULAR | Status: DC | PRN

## 2021-04-21 MED ORDER — METHYLPREDNISOLONE ACETATE 80 MG/ML IJ SUSP
INTRAMUSCULAR | Status: DC | PRN
  Administered 2021-04-21: 80 mg

## 2021-04-21 MED ORDER — METHOCARBAMOL 1000 MG/10ML IJ SOLN
500.0000 mg | Freq: Four times a day (QID) | INTRAVENOUS | Status: DC | PRN
  Filled 2021-04-21: qty 5

## 2021-04-21 MED ORDER — DEXMEDETOMIDINE (PRECEDEX) IN NS 20 MCG/5ML (4 MCG/ML) IV SYRINGE
PREFILLED_SYRINGE | INTRAVENOUS | Status: DC | PRN
  Administered 2021-04-21: 8 ug via INTRAVENOUS

## 2021-04-21 MED ORDER — TRIAMTERENE-HCTZ 37.5-25 MG PO TABS
1.0000 | ORAL_TABLET | Freq: Every day | ORAL | Status: DC
  Administered 2021-04-22: 1 via ORAL
  Filled 2021-04-21: qty 1

## 2021-04-21 MED ORDER — HYDROMORPHONE HCL 1 MG/ML IJ SOLN: 1.0000 mg | INTRAMUSCULAR | Status: DC | PRN

## 2021-04-21 MED ORDER — OXYCODONE HCL 5 MG PO TABS: 5.0000 mg | ORAL_TABLET | Freq: Once | ORAL | Status: DC | PRN

## 2021-04-21 MED ORDER — LACTATED RINGERS IV SOLN: INTRAVENOUS | Status: DC

## 2021-04-21 MED ORDER — FLEET ENEMA 7-19 GM/118ML RE ENEM: 1.0000 | ENEMA | Freq: Once | RECTAL | Status: DC | PRN

## 2021-04-21 MED ORDER — HYDROMORPHONE HCL 1 MG/ML IJ SOLN
INTRAMUSCULAR | Status: AC
  Filled 2021-04-21: qty 0.5

## 2021-04-21 MED ORDER — HYDROMORPHONE HCL 1 MG/ML IJ SOLN
INTRAMUSCULAR | Status: AC
  Filled 2021-04-21: qty 1

## 2021-04-21 MED ORDER — ALBUTEROL SULFATE (2.5 MG/3ML) 0.083% IN NEBU: 2.5000 mg | INHALATION_SOLUTION | Freq: Four times a day (QID) | RESPIRATORY_TRACT | Status: DC | PRN

## 2021-04-21 MED ORDER — THROMBIN 5000 UNITS EX SOLR
OROMUCOSAL | Status: DC | PRN
  Administered 2021-04-21: 5 mL via TOPICAL

## 2021-04-21 MED ORDER — AMLODIPINE BESYLATE 5 MG PO TABS
10.0000 mg | ORAL_TABLET | Freq: Every day | ORAL | Status: DC
  Administered 2021-04-21: 10 mg via ORAL
  Filled 2021-04-21: qty 2

## 2021-04-21 MED ORDER — ALBUTEROL SULFATE HFA 108 (90 BASE) MCG/ACT IN AERS
INHALATION_SPRAY | RESPIRATORY_TRACT | Status: DC | PRN
  Administered 2021-04-21: 4 via RESPIRATORY_TRACT

## 2021-04-21 MED ORDER — OXYCODONE HCL 5 MG PO TABS: 5.0000 mg | ORAL_TABLET | ORAL | Status: DC | PRN

## 2021-04-21 MED ORDER — FENTANYL CITRATE (PF) 250 MCG/5ML IJ SOLN
INTRAMUSCULAR | Status: DC | PRN
  Administered 2021-04-21 (×2): 50 ug via INTRAVENOUS
  Administered 2021-04-21: 100 ug via INTRAVENOUS
  Administered 2021-04-21: 50 ug via INTRAVENOUS

## 2021-04-21 MED ORDER — LORATADINE 10 MG PO TABS
10.0000 mg | ORAL_TABLET | Freq: Every day | ORAL | Status: DC
  Administered 2021-04-22: 10 mg via ORAL
  Filled 2021-04-21: qty 1

## 2021-04-21 MED ORDER — HYDROMORPHONE HCL 1 MG/ML IJ SOLN
INTRAMUSCULAR | Status: DC | PRN
  Administered 2021-04-21 (×3): .25 mg via INTRAVENOUS

## 2021-04-21 MED ORDER — LIDOCAINE-EPINEPHRINE 1 %-1:100000 IJ SOLN
INTRAMUSCULAR | Status: DC | PRN
  Administered 2021-04-21: 7 mL

## 2021-04-21 MED ORDER — ALBUTEROL SULFATE HFA 108 (90 BASE) MCG/ACT IN AERS: 2.0000 | INHALATION_SPRAY | Freq: Four times a day (QID) | RESPIRATORY_TRACT | Status: DC | PRN

## 2021-04-21 MED ORDER — LACTULOSE 10 GM/15ML PO SOLN
10.0000 g | Freq: Every day | ORAL | Status: DC
  Administered 2021-04-21: 10 g via ORAL
  Filled 2021-04-21: qty 15

## 2021-04-21 MED ORDER — ORAL CARE MOUTH RINSE: 15.0000 mL | Freq: Once | OROMUCOSAL | Status: AC

## 2021-04-21 MED ORDER — DEXAMETHASONE SODIUM PHOSPHATE 10 MG/ML IJ SOLN
INTRAMUSCULAR | Status: DC | PRN
  Administered 2021-04-21: 10 mg via INTRAVENOUS

## 2021-04-21 MED ORDER — ENOXAPARIN SODIUM 40 MG/0.4ML IJ SOSY
40.0000 mg | PREFILLED_SYRINGE | INTRAMUSCULAR | Status: DC
  Administered 2021-04-22: 40 mg via SUBCUTANEOUS
  Filled 2021-04-21: qty 0.4

## 2021-04-21 MED ORDER — FENTANYL CITRATE (PF) 250 MCG/5ML IJ SOLN
INTRAMUSCULAR | Status: AC
  Filled 2021-04-21: qty 5

## 2021-04-21 MED ORDER — CHLORHEXIDINE GLUCONATE 0.12 % MT SOLN
15.0000 mL | Freq: Once | OROMUCOSAL | Status: AC
  Administered 2021-04-21: 15 mL via OROMUCOSAL
  Filled 2021-04-21: qty 15

## 2021-04-21 MED ORDER — LIDOCAINE 2% (20 MG/ML) 5 ML SYRINGE
INTRAMUSCULAR | Status: DC | PRN
  Administered 2021-04-21: 80 mg via INTRAVENOUS

## 2021-04-21 MED ORDER — LIDOCAINE-EPINEPHRINE 1 %-1:100000 IJ SOLN
INTRAMUSCULAR | Status: AC
  Filled 2021-04-21: qty 1

## 2021-04-21 MED ORDER — FUROSEMIDE 20 MG PO TABS
20.0000 mg | ORAL_TABLET | Freq: Every day | ORAL | Status: DC
  Administered 2021-04-22: 20 mg via ORAL
  Filled 2021-04-21: qty 1

## 2021-04-21 MED ORDER — PHENYLEPHRINE HCL-NACL 20-0.9 MG/250ML-% IV SOLN
INTRAVENOUS | Status: DC | PRN
  Administered 2021-04-21: 40 ug/min via INTRAVENOUS

## 2021-04-21 MED ORDER — BUPIVACAINE HCL (PF) 0.5 % IJ SOLN
INTRAMUSCULAR | Status: AC
  Filled 2021-04-21: qty 30

## 2021-04-21 MED ORDER — SODIUM CHLORIDE 0.9% FLUSH
3.0000 mL | Freq: Two times a day (BID) | INTRAVENOUS | Status: DC
  Administered 2021-04-21: 3 mL via INTRAVENOUS

## 2021-04-21 MED ORDER — OXYCODONE HCL 5 MG PO TABS
10.0000 mg | ORAL_TABLET | ORAL | Status: DC | PRN
  Administered 2021-04-21 – 2021-04-22 (×4): 10 mg via ORAL
  Filled 2021-04-21 (×4): qty 2

## 2021-04-21 MED ORDER — ONDANSETRON HCL 4 MG/2ML IJ SOLN
INTRAMUSCULAR | Status: AC
  Filled 2021-04-21: qty 2

## 2021-04-21 MED ORDER — ASPIRIN EC 81 MG PO TBEC: 81.0000 mg | DELAYED_RELEASE_TABLET | Freq: Every day | ORAL | Status: DC

## 2021-04-21 MED ORDER — ATORVASTATIN CALCIUM 40 MG PO TABS
40.0000 mg | ORAL_TABLET | Freq: Every day | ORAL | Status: DC
  Administered 2021-04-21: 40 mg via ORAL
  Filled 2021-04-21: qty 1

## 2021-04-21 MED ORDER — KETOROLAC TROMETHAMINE 30 MG/ML IJ SOLN
30.0000 mg | Freq: Three times a day (TID) | INTRAMUSCULAR | Status: DC | PRN
  Administered 2021-04-21 – 2021-04-22 (×2): 30 mg via INTRAVENOUS
  Filled 2021-04-21 (×2): qty 1

## 2021-04-21 MED ORDER — 0.9 % SODIUM CHLORIDE (POUR BTL) OPTIME
TOPICAL | Status: DC | PRN
  Administered 2021-04-21: 1000 mL

## 2021-04-21 MED ORDER — HYDROCODONE-ACETAMINOPHEN 10-325 MG PO TABS
ORAL_TABLET | ORAL | Status: AC
  Administered 2021-04-21: 1
  Filled 2021-04-21: qty 1

## 2021-04-21 MED ORDER — MELOXICAM 7.5 MG PO TABS
7.5000 mg | ORAL_TABLET | Freq: Two times a day (BID) | ORAL | Status: DC
  Administered 2021-04-21 – 2021-04-22 (×2): 7.5 mg via ORAL
  Filled 2021-04-21 (×2): qty 1

## 2021-04-21 MED ORDER — POLYETHYLENE GLYCOL 3350 17 G PO PACK: 17.0000 g | PACK | Freq: Every day | ORAL | Status: DC | PRN

## 2021-04-21 MED ORDER — MEPERIDINE HCL 25 MG/ML IJ SOLN: 6.2500 mg | INTRAMUSCULAR | Status: DC | PRN

## 2021-04-21 MED ORDER — CEFAZOLIN SODIUM-DEXTROSE 2-4 GM/100ML-% IV SOLN
2.0000 g | INTRAVENOUS | Status: AC
  Administered 2021-04-21: 2 g via INTRAVENOUS
  Filled 2021-04-21: qty 100

## 2021-04-21 MED ORDER — ONDANSETRON HCL 4 MG/2ML IJ SOLN
INTRAMUSCULAR | Status: DC | PRN
  Administered 2021-04-21: 4 mg via INTRAVENOUS

## 2021-04-21 MED ORDER — PROPOFOL 10 MG/ML IV BOLUS
INTRAVENOUS | Status: DC | PRN
  Administered 2021-04-21: 30 mg via INTRAVENOUS
  Administered 2021-04-21: 170 mg via INTRAVENOUS
  Administered 2021-04-21 (×2): 30 mg via INTRAVENOUS

## 2021-04-21 MED ORDER — CHLORHEXIDINE GLUCONATE CLOTH 2 % EX PADS: 6.0000 | MEDICATED_PAD | Freq: Once | CUTANEOUS | Status: DC

## 2021-04-21 MED ORDER — ACETAMINOPHEN 650 MG RE SUPP: 650.0000 mg | RECTAL | Status: DC | PRN

## 2021-04-21 MED ORDER — HYDROCODONE-ACETAMINOPHEN 10-325 MG PO TABS: 1.0000 | ORAL_TABLET | ORAL | Status: DC | PRN

## 2021-04-21 MED ORDER — DOCUSATE SODIUM 100 MG PO CAPS: 100.0000 mg | ORAL_CAPSULE | Freq: Two times a day (BID) | ORAL | 2 refills | Status: AC

## 2021-04-21 MED ORDER — HYDROMORPHONE HCL 1 MG/ML IJ SOLN
0.2500 mg | INTRAMUSCULAR | Status: DC | PRN
  Administered 2021-04-21 (×4): 0.5 mg via INTRAVENOUS

## 2021-04-21 MED ORDER — ONDANSETRON HCL 4 MG PO TABS
4.0000 mg | ORAL_TABLET | Freq: Two times a day (BID) | ORAL | Status: DC
  Administered 2021-04-22: 4 mg via ORAL
  Filled 2021-04-21: qty 1

## 2021-04-21 MED ORDER — HYDROMORPHONE HCL 1 MG/ML IJ SOLN
0.2500 mg | INTRAMUSCULAR | Status: DC | PRN
  Administered 2021-04-21 (×2): 0.5 mg via INTRAVENOUS

## 2021-04-21 MED ORDER — ONDANSETRON HCL 4 MG PO TABS: 4.0000 mg | ORAL_TABLET | Freq: Four times a day (QID) | ORAL | Status: DC | PRN

## 2021-04-21 MED ORDER — SUGAMMADEX SODIUM 200 MG/2ML IV SOLN
INTRAVENOUS | Status: DC | PRN
  Administered 2021-04-21: 200 mg via INTRAVENOUS

## 2021-04-21 MED ORDER — HYDROMORPHONE HCL 1 MG/ML IJ SOLN: 0.2500 mg | INTRAMUSCULAR | Status: DC | PRN

## 2021-04-21 MED ORDER — PANTOPRAZOLE SODIUM 40 MG PO TBEC
40.0000 mg | DELAYED_RELEASE_TABLET | Freq: Two times a day (BID) | ORAL | Status: DC
  Administered 2021-04-21 – 2021-04-22 (×2): 40 mg via ORAL
  Filled 2021-04-21 (×2): qty 1

## 2021-04-21 MED ORDER — KETAMINE HCL 50 MG/5ML IJ SOSY
PREFILLED_SYRINGE | INTRAMUSCULAR | Status: AC
  Filled 2021-04-21: qty 5

## 2021-04-21 MED ORDER — KETAMINE HCL 10 MG/ML IJ SOLN
INTRAMUSCULAR | Status: DC | PRN
  Administered 2021-04-21 (×2): 10 mg via INTRAVENOUS
  Administered 2021-04-21: 30 mg via INTRAVENOUS

## 2021-04-21 MED ORDER — PROPOFOL 10 MG/ML IV BOLUS
INTRAVENOUS | Status: AC
  Filled 2021-04-21: qty 20

## 2021-04-21 MED ORDER — ALBUTEROL SULFATE HFA 108 (90 BASE) MCG/ACT IN AERS
INHALATION_SPRAY | RESPIRATORY_TRACT | Status: AC
  Filled 2021-04-21: qty 6.7

## 2021-04-21 MED ORDER — METHOCARBAMOL 500 MG PO TABS
500.0000 mg | ORAL_TABLET | Freq: Four times a day (QID) | ORAL | Status: DC | PRN
  Administered 2021-04-21 – 2021-04-22 (×2): 500 mg via ORAL
  Filled 2021-04-21 (×2): qty 1

## 2021-04-21 MED ORDER — PREGABALIN 75 MG PO CAPS
150.0000 mg | ORAL_CAPSULE | Freq: Every day | ORAL | Status: DC
  Administered 2021-04-21: 150 mg via ORAL
  Filled 2021-04-21: qty 2

## 2021-04-21 MED ORDER — MOMETASONE FURO-FORMOTEROL FUM 100-5 MCG/ACT IN AERO
2.0000 | INHALATION_SPRAY | Freq: Two times a day (BID) | RESPIRATORY_TRACT | Status: DC
Start: 2021-04-21 — End: 2021-04-22
  Filled 2021-04-21: qty 8.8

## 2021-04-21 MED ORDER — METOPROLOL SUCCINATE ER 25 MG PO TB24
25.0000 mg | ORAL_TABLET | Freq: Every day | ORAL | Status: DC
Start: 2021-04-21 — End: 2021-04-22
  Administered 2021-04-21: 25 mg via ORAL
  Filled 2021-04-21: qty 1

## 2021-04-21 MED ORDER — POTASSIUM CHLORIDE IN NACL 20-0.9 MEQ/L-% IV SOLN: INTRAVENOUS | Status: DC

## 2021-04-21 MED ORDER — MIDAZOLAM HCL 2 MG/2ML IJ SOLN
INTRAMUSCULAR | Status: AC
  Filled 2021-04-21: qty 2

## 2021-04-21 MED ORDER — PRAMIPEXOLE DIHYDROCHLORIDE 0.25 MG PO TABS
0.7500 mg | ORAL_TABLET | Freq: Every day | ORAL | Status: DC
  Filled 2021-04-21 (×2): qty 3

## 2021-04-21 MED ORDER — DEXAMETHASONE SODIUM PHOSPHATE 10 MG/ML IJ SOLN
INTRAMUSCULAR | Status: AC
  Filled 2021-04-21: qty 1

## 2021-04-21 MED ORDER — SODIUM CHLORIDE 0.9 % IV SOLN: 250.0000 mL | INTRAVENOUS | Status: DC

## 2021-04-21 MED ORDER — PHENOL 1.4 % MT LIQD: 1.0000 | OROMUCOSAL | Status: DC | PRN

## 2021-04-21 MED ORDER — DOCUSATE SODIUM 100 MG PO CAPS
100.0000 mg | ORAL_CAPSULE | Freq: Two times a day (BID) | ORAL | Status: DC
  Administered 2021-04-21 – 2021-04-22 (×2): 100 mg via ORAL
  Filled 2021-04-21 (×2): qty 1

## 2021-04-21 MED ORDER — ACETAMINOPHEN 325 MG PO TABS
650.0000 mg | ORAL_TABLET | ORAL | Status: DC | PRN
  Administered 2021-04-21 – 2021-04-22 (×2): 650 mg via ORAL
  Filled 2021-04-21 (×2): qty 2

## 2021-04-21 MED ORDER — ROCURONIUM BROMIDE 10 MG/ML (PF) SYRINGE
PREFILLED_SYRINGE | INTRAVENOUS | Status: AC
  Filled 2021-04-21: qty 10

## 2021-04-21 MED ORDER — HYDROCODONE-ACETAMINOPHEN 10-325 MG PO TABS: 1.0000 | ORAL_TABLET | ORAL | 0 refills | Status: DC | PRN

## 2021-04-21 MED ORDER — ROCURONIUM BROMIDE 10 MG/ML (PF) SYRINGE
PREFILLED_SYRINGE | INTRAVENOUS | Status: DC | PRN
  Administered 2021-04-21: 70 mg via INTRAVENOUS

## 2021-04-21 MED ORDER — ACETAMINOPHEN 500 MG PO TABS
1000.0000 mg | ORAL_TABLET | Freq: Once | ORAL | Status: AC
  Administered 2021-04-21: 1000 mg via ORAL
  Filled 2021-04-21: qty 2

## 2021-04-21 MED ORDER — BACLOFEN 10 MG PO TABS
10.0000 mg | ORAL_TABLET | Freq: Two times a day (BID) | ORAL | Status: DC
  Administered 2021-04-21 – 2021-04-22 (×2): 10 mg via ORAL
  Filled 2021-04-21 (×2): qty 1

## 2021-04-21 SURGICAL SUPPLY — 56 items
BAG COUNTER SPONGE SURGICOUNT (BAG) ×3 IMPLANT
BAND RUBBER #18 3X1/16 STRL (MISCELLANEOUS) ×4 IMPLANT
BENZOIN TINCTURE PRP APPL 2/3 (GAUZE/BANDAGES/DRESSINGS) ×2 IMPLANT
BLADE CLIPPER SURG (BLADE) IMPLANT
BUR CARBIDE MATCH 3.0 (BURR) ×1 IMPLANT
BUR PRECISION FLUTE 5.0 (BURR) ×1 IMPLANT
CANISTER SUCT 3000ML PPV (MISCELLANEOUS) ×2 IMPLANT
DECANTER SPIKE VIAL GLASS SM (MISCELLANEOUS) ×1 IMPLANT
DERMABOND ADHESIVE PROPEN (GAUZE/BANDAGES/DRESSINGS) ×1
DERMABOND ADVANCED .7 DNX6 (GAUZE/BANDAGES/DRESSINGS) IMPLANT
DRAPE LAPAROTOMY 100X72X124 (DRAPES) ×2 IMPLANT
DRAPE MICROSCOPE LEICA (MISCELLANEOUS) ×2 IMPLANT
DRAPE SURG 17X23 STRL (DRAPES) ×2 IMPLANT
DRSG MEPILEX BORDER 4X4 (GAUZE/BANDAGES/DRESSINGS) IMPLANT
DRSG OPSITE POSTOP 3X4 (GAUZE/BANDAGES/DRESSINGS) ×2 IMPLANT
DRSG OPSITE POSTOP 4X6 (GAUZE/BANDAGES/DRESSINGS) ×1 IMPLANT
DURAPREP 26ML APPLICATOR (WOUND CARE) ×2 IMPLANT
ELECT BLADE INSULATED 4IN (ELECTROSURGICAL) ×2
ELECT REM PT RETURN 9FT ADLT (ELECTROSURGICAL) ×2
ELECTRODE BLADE INSULATED 4IN (ELECTROSURGICAL) ×1 IMPLANT
ELECTRODE REM PT RTRN 9FT ADLT (ELECTROSURGICAL) ×1 IMPLANT
GAUZE 4X4 16PLY ~~LOC~~+RFID DBL (SPONGE) IMPLANT
GLOVE EXAM NITRILE XL STR (GLOVE) IMPLANT
GLOVE SURG ENC MOIS LTX SZ8 (GLOVE) ×2 IMPLANT
GLOVE SURG LTX SZ7.5 (GLOVE) ×2 IMPLANT
GLOVE SURG UNDER POLY LF SZ7.5 (GLOVE) ×3 IMPLANT
GLOVE SURG UNDER POLY LF SZ8.5 (GLOVE) ×2 IMPLANT
GOWN STRL REUS W/ TWL LRG LVL3 (GOWN DISPOSABLE) ×2 IMPLANT
GOWN STRL REUS W/ TWL XL LVL3 (GOWN DISPOSABLE) ×1 IMPLANT
GOWN STRL REUS W/TWL 2XL LVL3 (GOWN DISPOSABLE) IMPLANT
GOWN STRL REUS W/TWL LRG LVL3 (GOWN DISPOSABLE) ×2
GOWN STRL REUS W/TWL XL LVL3 (GOWN DISPOSABLE) ×2
HEMOSTAT POWDER KIT SURGIFOAM (HEMOSTASIS) ×2 IMPLANT
KIT BASIN OR (CUSTOM PROCEDURE TRAY) ×2 IMPLANT
KIT TURNOVER KIT B (KITS) ×2 IMPLANT
NDL HYPO 18GX1.5 BLUNT FILL (NEEDLE) IMPLANT
NDL SPNL 18GX3.5 QUINCKE PK (NEEDLE) IMPLANT
NEEDLE HYPO 18GX1.5 BLUNT FILL (NEEDLE) ×2 IMPLANT
NEEDLE HYPO 22GX1.5 SAFETY (NEEDLE) ×2 IMPLANT
NEEDLE SPNL 18GX3.5 QUINCKE PK (NEEDLE) ×2 IMPLANT
NS IRRIG 1000ML POUR BTL (IV SOLUTION) ×2 IMPLANT
PACK LAMINECTOMY NEURO (CUSTOM PROCEDURE TRAY) ×2 IMPLANT
PAD ARMBOARD 7.5X6 YLW CONV (MISCELLANEOUS) ×5 IMPLANT
SPONGE SURGIFOAM ABS GEL SZ50 (HEMOSTASIS) IMPLANT
SPONGE T-LAP 4X18 ~~LOC~~+RFID (SPONGE) IMPLANT
STRIP CLOSURE SKIN 1/2X4 (GAUZE/BANDAGES/DRESSINGS) ×2 IMPLANT
SUT MNCRL AB 4-0 PS2 18 (SUTURE) ×2 IMPLANT
SUT VIC AB 0 CT1 18XCR BRD8 (SUTURE) ×1 IMPLANT
SUT VIC AB 0 CT1 8-18 (SUTURE) ×1
SUT VIC AB 2-0 CP2 18 (SUTURE) ×2 IMPLANT
SUT VIC AB 3-0 SH 8-18 (SUTURE) ×2 IMPLANT
SYR 3ML LL SCALE MARK (SYRINGE) ×1 IMPLANT
TIP KERRISON THIN FOOTPLATE 3M (MISCELLANEOUS) ×1 IMPLANT
TOWEL GREEN STERILE (TOWEL DISPOSABLE) ×2 IMPLANT
TOWEL GREEN STERILE FF (TOWEL DISPOSABLE) ×2 IMPLANT
WATER STERILE IRR 1000ML POUR (IV SOLUTION) ×2 IMPLANT

## 2021-04-21 NOTE — Anesthesia Procedure Notes (Signed)
Procedure Name: Intubation ?Date/Time: 04/21/2021 11:33 AM ?Performed by: Rande Brunt, CRNA ?Pre-anesthesia Checklist: Patient identified, Emergency Drugs available, Suction available and Patient being monitored ?Patient Re-evaluated:Patient Re-evaluated prior to induction ?Oxygen Delivery Method: Circle System Utilized ?Preoxygenation: Pre-oxygenation with 100% oxygen ?Induction Type: IV induction ?Ventilation: Mask ventilation without difficulty and Oral airway inserted - appropriate to patient size ?Laryngoscope Size: Mac and 4 ?Grade View: Grade I ?Tube type: Oral ?Number of attempts: 1 ?Airway Equipment and Method: Stylet and Oral airway ?Placement Confirmation: ETT inserted through vocal cords under direct vision, positive ETCO2 and breath sounds checked- equal and bilateral ?Secured at: 22 cm ?Tube secured with: Tape ?Dental Injury: Teeth and Oropharynx as per pre-operative assessment  ?Comments: ETT tube placed by EMT student ? ? ? ? ?

## 2021-04-21 NOTE — Anesthesia Preprocedure Evaluation (Addendum)
Anesthesia Evaluation  ?Patient identified by MRN, date of birth, ID band ?Patient awake ? ? ? ?Reviewed: ?Allergy & Precautions, NPO status , Patient's Chart, lab work & pertinent test results, reviewed documented beta blocker date and time  ? ?History of Anesthesia Complications ?Negative for: history of anesthetic complications ? ?Airway ?Mallampati: I ? ?TM Distance: >3 FB ?Neck ROM: Full ? ? ? Dental ? ?(+) Edentulous Upper, Edentulous Lower ?  ?Pulmonary ?sleep apnea (does not use CPAP, mild) , COPD,  COPD inhaler and oxygen dependent, Current SmokerPatient did not abstain from smoking.,  ?  ?breath sounds clear to auscultation ? ? ? ? ? ? Cardiovascular ?hypertension, Pt. on medications and Pt. on home beta blockers ?(-) angina ?Rhythm:Regular Rate:Normal ? ?07/2020 ECHO: EF 55-60%, normal LVF, normal RVF, no significant valvular abnormalities ?  ?Neuro/Psych ? Headaches,   ? GI/Hepatic ?Neg liver ROS, GERD  Medicated and Controlled,  ?Endo/Other  ?obese ? Renal/GU ?negative Renal ROS  ? ?  ?Musculoskeletal ? ? Abdominal ?(+) + obese,   ?Peds ? Hematology ?negative hematology ROS ?(+)   ?Anesthesia Other Findings ? ? Reproductive/Obstetrics ? ?  ? ? ? ? ? ? ? ? ? ? ? ? ? ?  ?  ? ? ? ? ? ? ? ?Anesthesia Physical ?Anesthesia Plan ? ?ASA: 3 ? ?Anesthesia Plan: General  ? ?Post-op Pain Management: Tylenol PO (pre-op)*  ? ?Induction: Intravenous ? ?PONV Risk Score and Plan: 1 and Ondansetron and Dexamethasone ? ?Airway Management Planned: Oral ETT ? ?Additional Equipment: None ? ?Intra-op Plan:  ? ?Post-operative Plan: Extubation in OR ? ?Informed Consent: I have reviewed the patients History and Physical, chart, labs and discussed the procedure including the risks, benefits and alternatives for the proposed anesthesia with the patient or authorized representative who has indicated his/her understanding and acceptance.  ? ? ? ?Dental advisory given ? ?Plan Discussed with: CRNA and  Surgeon ? ?Anesthesia Plan Comments:   ? ? ? ? ? ?Anesthesia Quick Evaluation ? ?

## 2021-04-21 NOTE — Op Note (Signed)
PREOP DIAGNOSIS: Lumbar spinal stenosis, L4-5 ? ?POSTOP DIAGNOSIS: Same ? ?PROCEDURE: ?L4-5 bilateral laminotomies, medial facetectomies, and foraminotomies ?Use of microscope for intraoperative microdissection ? ?SURGEON: Dr. Duffy Rhody, MD ? ?ASSISTANT: Weston Brass, NP. Please note, there were no qualified trainees available to assist with the procedure.  An assistant was required for aid in retraction of the neural elements. ?  ?ANESTHESIA: General Endotracheal ? ?EBL: 100 ml ? ?SPECIMENS: None ? ?DRAINS: None ? ?COMPLICATIONS: none ? ?CONDITION: Stable to PCAU ? ?HISTORY: ?Jerry Aguirre is a 56 y.o. male who initially presented to the outpatient clinic with symptoms consistent with neurogenic claudication. MRI showed severe stenosis at L4-5. Treatment options were discussed and the patient elected to proceed with surgical decompression by laminectomy and foraminotomies at L4-5.  The general technique of surgery, as well as risk, benefits, terms, and expected convalescence were discussed with patient.  Risks discussed included, but were not limited to, bleeding, pain, infection, scar, neurologic deficit, spinal fluid leak, instability, and death.  Informed consent was obtained and he wished to proceed with surgery. ? ?PROCEDURE IN DETAIL: ?After informed consent was obtained and witnessed, the patient was brought to the operating room. After induction of general anesthesia, the patient was positioned on the operative table in the prone position with all pressure points meticulously padded. The skin of the low back was then prepped and draped in the usual sterile fashion. ? ?Under fluoroscopy, the correct levels were identified and marked out on the skin, and after timeout was conducted, the skin was infiltrated with local anesthetic. Skin incision was then made sharply and Bovie electrocautery was used to dissect the subcutaneous tissue until the lumbodorsal fascia was identified. The fascia was then  incised using Bovie electrocautery and the lamina at the L4-5  levels was identified and dissection was carried out in the subperiosteal plane. Self-retaining retractor was then placed, and intraoperative x-ray was taken to confirm we were at the correct levels. ? ?The microscope was then introduced into the field to allow for intraoperative microdissection.  Using a high-speed drill, bilateral laminotomies and medial facetectomies were performed at L4-5.  The spinous process and interspinous ligament was kept intact.  The ligamentum flavum was then identified and removed and the lateral edge of the thecal sac was identified bilaterally. Using a combination of the high-speed drill and Rogers, good lateral decompression was completed. Kerrison rongeurs were then used to complete foraminotomies at L4-5 bilaterally, as well as follow the bilateral L5 nerve roots with undercutting of the L5-S1 foramen. The decompression was confirmed using a small ball tip dissector.  He had a large broad L4-5 disc bulge but as the disc was partially calcified and require a fairly aggressive discectomy, I did not perform a discectomy as his nerve roots were already well decompressed.  Hemostasis was then secured using a combination of morcellized Gelfoam and thrombin and bipolar electrocautery. The wound was irrigated with copious amounts of antibiotic saline irrigation. Self-retaining retractor was then removed, and the wound is closed in layers using a combination of interrupted 0 Vicryl and 2-0 Vicryl stitches followed by 4-0 monocryl in subcuticular manner and mastisol and steri-strips.  A sterile dressing was placed.  ? ?At the end of the case all sponge, needle, and instrument counts were correct. The patient was then transferred to the stretcher and taken to the postanesthesia care unit in stable hemodynamic condition. ? ?

## 2021-04-21 NOTE — Discharge Instructions (Addendum)
Can remove transparent dressing and shower 48 hours after surgery Walk as much as possible No heavy lifting >10 lbs No excessive bending/twisting at the waist  

## 2021-04-21 NOTE — Addendum Note (Signed)
Addendum  created 04/21/21 1617 by Annye Asa, MD  ? Order list changed, Pharmacy for encounter modified  ?  ?

## 2021-04-21 NOTE — Progress Notes (Signed)
Patient will be admitted for pain control and mobilization.  Continues to complain of numbness in hands and feet which is a chronic symptom.  He is a chronic opioid user, has been on hydrocodone 10/325 mg for 2 years.  Tentative plan for discharge tomorrow. ?

## 2021-04-21 NOTE — Transfer of Care (Signed)
Immediate Anesthesia Transfer of Care Note ? ?Patient: Jerry Aguirre ? ?Procedure(s) Performed: LAMINECTOMY, MEDIAL FACETECTOMY LUMBAR FOUR- FIVE ? ?Patient Location: PACU ? ?Anesthesia Type:General ? ?Level of Consciousness: drowsy, patient cooperative and responds to stimulation ? ?Airway & Oxygen Therapy: Patient Spontanous Breathing and Patient connected to face mask oxygen ? ?Post-op Assessment: Report given to RN, Post -op Vital signs reviewed and stable and Patient moving all extremities X 4 ? ?Post vital signs: Reviewed and stable ? ?Last Vitals:  ?Vitals Value Taken Time  ?BP    ?Temp    ?Pulse 69 04/21/21 1428  ?Resp 15 04/21/21 1428  ?SpO2 97 % 04/21/21 1428  ?Vitals shown include unvalidated device data. ? ?Last Pain:  ?Vitals:  ? 04/21/21 1009  ?TempSrc:   ?PainSc: 10-Worst pain ever  ?   ? ?Patients Stated Pain Goal: 4 (04/21/21 1009) ? ?Complications: No notable events documented. ?

## 2021-04-21 NOTE — Progress Notes (Signed)
Patient is resting with eyes closed at this time ?

## 2021-04-21 NOTE — Progress Notes (Addendum)
Patient placed in phase 2 at 1640, patient continues to complain of pain and states he is unable to walk, complaining of tingling in the hands and feet. Patient moved back to phase 1 at 1735 and [name] was contacted and he is aware. He will admit patient for observation to [facility] and plans for discharge home tomorrow. ?

## 2021-04-21 NOTE — Anesthesia Postprocedure Evaluation (Signed)
Anesthesia Post Note ? ?Patient: Jerry Aguirre ? ?Procedure(s) Performed: LAMINECTOMY, MEDIAL FACETECTOMY LUMBAR FOUR- FIVE ? ?  ? ?Patient location during evaluation: PACU ?Anesthesia Type: General ?Level of consciousness: sedated, patient cooperative and oriented ?Pain management: pain level controlled ?Vital Signs Assessment: post-procedure vital signs reviewed and stable ?Respiratory status: spontaneous breathing, nonlabored ventilation, respiratory function stable and patient connected to nasal cannula oxygen ?Cardiovascular status: blood pressure returned to baseline and stable ?Postop Assessment: no apparent nausea or vomiting ?Anesthetic complications: no ? ? ?No notable events documented. ? ?Last Vitals:  ?Vitals:  ? 04/21/21 1455 04/21/21 1510  ?BP: 114/73 114/65  ?Pulse: 68 70  ?Resp: 15 16  ?Temp:    ?SpO2: 93% 95%  ?  ?Last Pain:  ?Vitals:  ? 04/21/21 1455  ?TempSrc:   ?PainSc: Asleep  ? ? ?  ?  ?  ?  ?  ?  ? ?Jacquelyn Shadrick,E. Brittay Mogle ? ? ? ? ?

## 2021-04-21 NOTE — H&P (Signed)
CC: neurogenic claudication ? ?HPI: ? ?  ? ?Patient is a 56 y.o. male presented to the clinic with neurogenic claudication symptoms.  He was found to have lumbar stenosis.  He is here for elective lumbar decompression. ? ? ? ?Patient Active Problem List  ? Diagnosis Date Noted  ? Dyslipidemia 07/22/2020  ? Glucose intolerance 07/22/2020  ? 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 COPD (chronic obstructive pulmonary disease) ([place]) 07/21/2020  ? GERD (gastroesophageal reflux disease) 07/21/2020  ? Essential hypertension 07/21/2020  ? ?Past Medical History:  ?Diagnosis Date  ? COPD (chronic obstructive pulmonary disease) ([place])   ? GERD (gastroesophageal reflux disease)   ? Headache   ? Hyperlipidemia   ? Hypertension   ? Sleep apnea   ? pt says his "OSA was mild and they said he did not need a CPAP"  ?  ?Past Surgical History:  ?Procedure Laterality Date  ? HERNIA REPAIR Right   ? inguinal  ? TONSILLECTOMY    ? removed as a child  ?  ?Medications Prior to Admission  ?Medication Sig Dispense Refill Last Dose  ? albuterol (PROVENTIL HFA;VENTOLIN HFA) 108 (90 Base) MCG/ACT inhaler Inhale 2 puffs into the lungs every 6 (six) hours as needed for wheezing or shortness of breath.   04/21/2021 at 0800  ? amLODipine (NORVASC) 10 MG tablet Take 10 mg by mouth at bedtime.   04/20/2021  ? atorvastatin (LIPITOR) 40 MG tablet Take 1 tablet (40 mg total) by mouth daily at 6 PM. 30 tablet 0 04/20/2021  ? baclofen (LIORESAL) 10 MG tablet Take 10 mg by mouth 2 (two) times daily.   04/21/2021 at 0800  ? cetirizine (ZYRTEC) 10 MG tablet Take 10 mg by mouth daily.   04/21/2021 at 0800  ? furosemide (LASIX) 20 MG tablet Take 20 mg by mouth daily.   04/20/2021  ? HYDROcodone-acetaminophen (NORCO) 10-325 MG tablet Take 1 tablet by mouth 3 (three) times daily.   04/21/2021 at 0800  ? lactulose (CHRONULAC) 10 GM/15ML solution Take 10 g by mouth at bedtime.   Past Week  ? meloxicam (MOBIC) 7.5 MG tablet Take 7.5 mg by mouth 2 (two) times daily.   04/20/2021 at  0800  ? metoprolol succinate (TOPROL-XL) 25 MG 24 hr tablet Take 1 tablet (25 mg total) by mouth at bedtime. 30 tablet 0 04/20/2021  ? ondansetron (ZOFRAN) 4 MG tablet Take 4 mg by mouth 2 (two) times daily.   04/21/2021 at 0800  ? pantoprazole (PROTONIX) 40 MG tablet Take 40 mg by mouth 2 (two) times daily.   04/21/2021 at 0800  ? pramipexole (MIRAPEX) 0.75 MG tablet Take 0.75-1.5 mg by mouth See admin instructions. Take 0.75 mg in the morning and 1.5 mg at night   04/21/2021 at 0800  ? pregabalin (LYRICA) 150 MG capsule Take 150 mg by mouth at bedtime.   04/20/2021  ? SYMBICORT 80-4.5 MCG/ACT inhaler Inhale 2 puffs into the lungs 2 (two) times daily.   04/21/2021 at 0800  ? triamterene-hydrochlorothiazide (DYAZIDE) 37.5-25 MG capsule Take 1 capsule by mouth daily.   04/20/2021  ? aspirin EC 81 MG EC tablet Take 1 tablet (81 mg total) by mouth daily. Swallow whole. 30 tablet 11   ? ?Allergies  ?Allergen Reactions  ? Lisinopril Itching  ?  headaches  ? Lopressor [Metoprolol Tartrate]   ?  Unknown to pt   ?  ?Social History  ? ?Tobacco Use  ? Smoking status: Every Day  ?  Packs/day: 1.00  ?  Types: Cigarettes  ? Smokeless tobacco: Never  ?Substance Use Topics  ? Alcohol use: Not Currently  ?  ?History reviewed. No pertinent family history.  ? ?Review of Systems ?Pertinent items are noted in HPI. ? ?Objective:  ? ?Patient Vitals for the past 8 hrs: ? BP Temp Temp src Pulse Resp SpO2 Height Weight  ?04/21/21 0935 137/88 98.1 ?F (36.7 ?C) Oral 80 18 97 % '5\' 4"'$  (1.626 m) 85.3 kg  ? ?No intake/output data recorded. ?No intake/output data recorded. ? ? ? ? ? General : Alert, cooperative, no distress, appears stated age  ? Head:  Normocephalic/atraumatic   ? Eyes: PERRL, conjunctiva/corneas clear, EOM's intact. Fundi could not be visualized ?Neck: Supple ?Chest:  Respirations unlabored ?Chest wall: no tenderness or deformity ?Heart: Regular rate and rhythm ?Abdomen: Soft, nontender and nondistended ?Extremities: warm and  well-perfused ?Skin: normal turgor, color and texture ?Neurologic:  Alert, oriented x 3.  Eyes open spontaneously. PERRL, EOMI, VFC, no facial droop. V1-3 intact.  No dysarthria, tongue protrusion symmetric.  CNII-XII intact. Normal strength, sensation and reflexes throughout.  No pronator drift, full strength in legs.  + SLR bilaterally  ?  ? ? ? ?Data Review: ?See clinic note for detail ?L4-5 moderate to severe central and subarticular stenosis ? ?Assessment:  ? ?Active Problems: ?  * No active hospital problems. * ? ?56 yo M with lumbar stenosis with neurogenic claudication ? ?Plan:  ? ?- plan for posterior lumbar decompression today ?- informed consent obtained ? ? ?

## 2021-04-22 ENCOUNTER — Encounter (HOSPITAL_COMMUNITY): Payer: Self-pay | Admitting: Neurosurgery

## 2021-04-22 DIAGNOSIS — M48062 Spinal stenosis, lumbar region with neurogenic claudication: Secondary | ICD-10-CM | POA: Diagnosis not present

## 2021-04-22 MED ORDER — METHOCARBAMOL 500 MG PO TABS: 500.0000 mg | ORAL_TABLET | Freq: Four times a day (QID) | ORAL | 0 refills | Status: DC | PRN

## 2021-04-22 MED ORDER — OXYCODONE-ACETAMINOPHEN 10-325 MG PO TABS: 1.0000 | ORAL_TABLET | Freq: Four times a day (QID) | ORAL | 0 refills | Status: DC | PRN

## 2021-04-22 MED ORDER — CALCIUM CARBONATE ANTACID 500 MG PO CHEW
400.0000 mg | CHEWABLE_TABLET | Freq: Three times a day (TID) | ORAL | Status: DC | PRN
  Administered 2021-04-22: 400 mg via ORAL
  Filled 2021-04-22 (×2): qty 2

## 2021-04-22 NOTE — Discharge Summary (Signed)
Physician Discharge Summary  ? ? ? ?Providing Compassionate, Quality Care - Together ? ? ?Patient ID: ?Jerry Aguirre ?MRN: [id number] ?DOB/AGE: July 01, 1965 56 y.o. ? ?Admit date: 04/21/2021 ?Discharge date: 04/22/2021 ? ?Admission Diagnoses: Lumbar stenosis ? ?Discharge Diagnoses:  ?Principal Problem: ?  S/P laminectomy ?Active Problems: ?  Lumbar stenosis ? ? ?Discharged Condition: good ? ?Hospital Course: Patient underwent L4-5 laminotomies, medial facetectomies, and foraminotomies by [name] on 04/21/2021. He was admitted to 3C07 for pain control following recovery from anesthesia in the PACU. His postoperative course has been uncomplicated. He has worked with both physical and occupational therapies who feel the patient is ready for discharge home. He is ambulating independently and without difficulty. He is tolerating a normal diet. He is not having any bowel or bladder dysfunction. His pain is well-controlled with oral pain medication. He is ready for discharge home. ? ? ?Consults: PT/OT ? ?Significant Diagnostic Studies: radiology: DG Lumbar Spine 2-3 Views ? ?Result Date: 04/21/2021 ?CLINICAL DATA:  Laminectomy at L4-L5. EXAM: LUMBAR SPINE - 2-3 VIEW COMPARISON:  Lumbar spine 03/15/2020 FINDINGS: Two lateral images of the lumbar spine were obtained. Surgical localization along the posterior aspect of L4-L5. Alignment of lumbar spine is within normal limits. Again noted are endplate changes at L4. IMPRESSION: Surgical localization at L4-L5. Electronically Signed   By: Markus Daft M.D.   On: 04/21/2021 14:04   ? ? ?Treatments: surgery: ? ?L4-5 bilateral laminotomies, medial facetectomies, and foraminotomies ?Use of microscope for intraoperative microdissection ?  ? ?Discharge Exam: ?Blood pressure (!) 140/95, pulse 83, temperature 97.9 ?F (36.6 ?C), resp. rate 17, height '5\' 4"'$  (1.626 m), weight 85.3 kg, SpO2 97 %. ? ?Alert and oriented x 4 ?PERRLA ?CN II-XII grossly intact ?MAE, Strength and sensation intact ?Incision  is covered with Honeycomb dressing and Steri Strips; Dressing is clean, dry, and intact ? ? ?Disposition: Discharge disposition: 01-Home or Self Care ? ? ? ? ? ? ?Discharge Instructions   ? ? Incentive spirometry RT   Complete by: As directed ?  ? ?  ? ?Allergies as of 04/22/2021   ? ?   Reactions  ? Lisinopril Itching  ? headaches  ? Lopressor [metoprolol Tartrate]   ? Unknown to pt   ? ?  ? ?  ?Medication List  ?  ? ?STOP taking these medications   ? ?HYDROcodone-acetaminophen 10-325 MG tablet ?Commonly known as: NORCO ?  ? ?  ? ?TAKE these medications   ? ?albuterol 108 (90 Base) MCG/ACT inhaler ?Commonly known as: VENTOLIN HFA ?Inhale 2 puffs into the lungs every 6 (six) hours as needed for wheezing or shortness of breath. ?  ?amLODipine 10 MG tablet ?Commonly known as: NORVASC ?Take 10 mg by mouth at bedtime. ?  ?aspirin 81 MG EC tablet ?Take 1 tablet (81 mg total) by mouth daily. Swallow whole. ?  ?atorvastatin 40 MG tablet ?Commonly known as: LIPITOR ?Take 1 tablet (40 mg total) by mouth daily at 6 PM. ?  ?baclofen 10 MG tablet ?Commonly known as: LIORESAL ?Take 10 mg by mouth 2 (two) times daily. ?  ?cetirizine 10 MG tablet ?Commonly known as: ZYRTEC ?Take 10 mg by mouth daily. ?  ?docusate sodium 100 MG capsule ?Commonly known as: Colace ?Take 1 capsule (100 mg total) by mouth 2 (two) times daily. ?  ?furosemide 20 MG tablet ?Commonly known as: LASIX ?Take 20 mg by mouth daily. ?  ?lactulose 10 GM/15ML solution ?Commonly known as: [place] ?Take 10 g by mouth at  bedtime. ?  ?meloxicam 7.5 MG tablet ?Commonly known as: MOBIC ?Take 7.5 mg by mouth 2 (two) times daily. ?  ?methocarbamol 500 MG tablet ?Commonly known as: ROBAXIN ?Take 1 tablet (500 mg total) by mouth every 6 (six) hours as needed for muscle spasms. ?  ?metoprolol succinate 25 MG 24 hr tablet ?Commonly known as: TOPROL-XL ?Take 1 tablet (25 mg total) by mouth at bedtime. ?  ?ondansetron 4 MG tablet ?Commonly known as: ZOFRAN ?Take 4 mg by mouth 2  (two) times daily. ?  ?oxyCODONE-acetaminophen 10-325 MG tablet ?Commonly known as: Percocet ?Take 1 tablet by mouth every 6 (six) hours as needed for pain. ?  ?pantoprazole 40 MG tablet ?Commonly known as: PROTONIX ?Take 40 mg by mouth 2 (two) times daily. ?  ?pramipexole 0.75 MG tablet ?Commonly known as: MIRAPEX ?Take 0.75-1.5 mg by mouth See admin instructions. Take 0.75 mg in the morning and 1.5 mg at night ?  ?pregabalin 150 MG capsule ?Commonly known as: LYRICA ?Take 150 mg by mouth at bedtime. ?  ?Symbicort 80-4.5 MCG/ACT inhaler ?Generic drug: budesonide-formoterol ?Inhale 2 puffs into the lungs 2 (two) times daily. ?  ?triamterene-hydrochlorothiazide 37.5-25 MG capsule ?Commonly known as: DYAZIDE ?Take 1 capsule by mouth daily. ?  ? ?  ? ? Follow-up Information   ? ? Vallarie Mare, MD. Schedule an appointment as soon as possible for a visit in 2 week(s).   ?Specialty: Neurosurgery ?Contact information: ?[address] ?[address] ?[place] [address] ?[phone] ? ? ?  ?  ? ?  ?  ? ?  ? ? ?Signed: ?Viona Gilmore, DNP, AGNP-C ?Nurse Practitioner ? ?[facility] ?[address], [address], [place], [place] [address] ?P: [phone]    F: [phone] ? ?04/22/2021, 8:53 AM ? ?

## 2021-04-22 NOTE — Progress Notes (Signed)
Patient awaiting transport via wheelchair by volunteer for discharge to home; in no acute distress nor complaints of pain nor discomfort; incision on his back with honeycomb dressing and is clean, dry and intact; room was checked for all his belongings and was carried by family members to their vehicle; discharge instructions concerning his medication, incision care, follow up appointment and when to call the doctor  as needed were discussed with patient by RN and family and all expressed understanding on the instructions given.  ?

## 2021-04-22 NOTE — Evaluation (Signed)
Occupational Therapy Evaluation ?Patient Details ?Name: Jerry Aguirre ?MRN: [id number] ?DOB: 11/11/1965 ?Today's Date: 04/22/2021 ? ? ?History of Present Illness 56 yo male s/p 4/6 L4-5 laminotomies medial facetectomies and foraminotomies PMH chronic opiod user, COPD, HLD, HTN, Sleep apnea, hernia repair smokes  ? ?Clinical Impression ?  ?Patient evaluated by Occupational Therapy with no further acute OT needs identified. All education has been completed and the patient has no further questions. See below for any follow-up Occupational Therapy or equipment needs. OT to sign off. Thank you for referral.  ? ?Pt educated on smoking cessation and need to use spirometer. Educated on spirometer proper use during session. Pt coughing a few times with increased time to rebound.   ?   ? ?Recommendations for follow up therapy are one component of a multi-disciplinary discharge planning process, led by the attending physician.  Recommendations may be updated based on patient status, additional functional criteria and insurance authorization.  ? ?Follow Up Recommendations ? No OT follow up  ?  ?Assistance Recommended at Discharge PRN  ?Patient can return home with the following Assistance with cooking/housework;A little help with bathing/dressing/bathroom ? ?  ?Functional Status Assessment ? Patient has had a recent decline in their functional status and demonstrates the ability to make significant improvements in function in a reasonable and predictable amount of time.  ?Equipment Recommendations ? None recommended by OT  ?  ?Recommendations for Other Services   ? ? ?  ?Precautions / Restrictions Precautions ?Precaution Comments: back handout and reviewed for adls. ?Restrictions ?Weight Bearing Restrictions: No  ? ?  ? ?Mobility Bed Mobility ?Overal bed mobility: Modified Independent ?  ?  ?  ?  ?  ?  ?  ?  ? ?Transfers ?Overall transfer level: Modified independent ?  ?  ?  ?  ?  ?  ?  ?  ?General transfer comment: completed  sit<>stand x4 during sssion ?  ? ?  ?Balance Overall balance assessment: Mild deficits observed, not formally tested ?  ?  ?  ?  ?  ?  ?  ?  ?  ?  ?  ?  ?  ?  ?  ?  ?  ?  ?   ? ?ADL either performed or assessed with clinical judgement  ? ?ADL Overall ADL's : Needs assistance/impaired ?Eating/Feeding: Modified independent ?  ?Grooming: Wash/dry face ?  ?Upper Body Bathing: Modified independent ?  ?Lower Body Bathing: Min guard;Adhering to back precautions;Sit to/from stand ?  ?  ?  ?Lower Body Dressing: Min guard;Adhering to back precautions;Sit to/from stand ?Lower Body Dressing Details (indicate cue type and reason): able to don thigh high ted hose during session with figure 4 cross ?Toilet Transfer: Min guard ?  ?  ?  ?  ?  ?Functional mobility during ADLs: Min guard;Rolling walker (2 wheels) ?General ADL Comments: pt at one point picking up RW back legs and states the vibration is killing my hands and walking with pushing wheels only. pt basically carrying the walker.  ? ? ? ?Vision Baseline Vision/History: 1 Wears glasses ?Ability to See in Adequate Light: 1 Impaired ?Additional Comments: pt states "i can't see well and trouble scanning phone for photo"  ?   ?Perception   ?  ?Praxis   ?  ? ?Pertinent Vitals/Pain Pain Assessment ?Pain Assessment: Faces ?Faces Pain Scale: Hurts little more ?Pain Location: back ?Pain Descriptors / Indicators: Operative site guarding ?Pain Intervention(s): Monitored during session, Premedicated before session, Repositioned  ? ? ? ?  Hand Dominance Right ?  ?Extremity/Trunk Assessment Upper Extremity Assessment ?Upper Extremity Assessment: RUE deficits/detail;LUE deficits/detail ?RUE Deficits / Details: wearing wrist cockup splints for carpal tunnerl ?LUE Deficits / Details: wearing wrist cockup splints for carpal tunnerl ?  ?Lower Extremity Assessment ?Lower Extremity Assessment: Overall WFL for tasks assessed ?  ?Cervical / Trunk Assessment ?Cervical / Trunk Assessment: Back  Surgery ?  ?Communication Communication ?Communication: No difficulties ?  ?Cognition Arousal/Alertness: Awake/alert ?Behavior During Therapy: [facility] for tasks assessed/performed ?Overall Cognitive Status: Within Functional Limits for tasks assessed ?  ?  ?  ?  ?  ?  ?  ?  ?  ?  ?  ?  ?  ?  ?  ?  ?General Comments: requires cues for bed mobility with back precautions but able to return demo with cues ?  ?  ?General Comments  incision dry and dressing intact ? ?  ?Exercises   ?  ?Shoulder Instructions    ? ? ?Home Living Family/patient expects to be discharged to:: Private residence ?Living Arrangements: Other (Comment) ?Available Help at Discharge: Family;Personal care attendant;Available PRN/intermittently ?Type of Home: Mobile home ?Home Access: Ramped entrance ?  ?  ?Home Layout: One level ?  ?  ?Bathroom Shower/Tub: Tub/shower unit ?  ?Bathroom Toilet: Standard ?Bathroom Accessibility: Yes ?How Accessible: Accessible via walker ?Home Equipment: Shower seat;Cane - quad;Standard Walker ?  ?Additional Comments: has 4 dogs in the home "star", bully and 39 59 week old puppies. roommate is an aide that helps him daily, sisterx2 live near by and both are aides as well. Pt has a nephew that lives next door ?  ? ?  ?Prior Functioning/Environment Prior Level of Function : Needs assist;Driving ?  ?  ?  ?Physical Assist : Mobility (physical);ADLs (physical) ?Mobility (physical): Gait ?ADLs (physical): Dressing ?Mobility Comments: uses cane ?ADLs Comments: requires (A) with LB sometimes like ted hose ?  ? ?  ?  ?OT Problem List:   ?  ?   ?OT Treatment/Interventions:    ?  ?OT Goals(Current goals can be found in the care plan section) Acute Rehab OT Goals ?Patient Stated Goal: to get ted hose on because my ankle is swelling. ?Potential to Achieve Goals: Good  ?OT Frequency:   ?  ? ?Co-evaluation   ?  ?  ?  ?  ? ?  ?AM-PAC OT "6 Clicks" Daily Activity     ?Outcome Measure Help from another person eating meals?: None ?Help from  another person taking care of personal grooming?: None ?Help from another person toileting, which includes using toliet, bedpan, or urinal?: None ?Help from another person bathing (including washing, rinsing, drying)?: A Little ?Help from another person to put on and taking off regular upper body clothing?: None ?Help from another person to put on and taking off regular lower body clothing?: A Little ?6 Click Score: 22 ?  ?End of Session Equipment Utilized During Treatment: Rolling walker (2 wheels) ?Nurse Communication: Mobility status;Precautions ? ?Activity Tolerance: Patient tolerated treatment well ?Patient left: in bed;with call bell/phone within reach ? ?   ?              ?Time: 2330-0762 ?OT Time Calculation (min): 25 min ?Charges:  OT General Charges ?$OT Visit: 1 Visit ?OT Evaluation ?$OT Eval Moderate Complexity: 1 Mod ?OT Treatments ?$Self Care/Home Management : 8-22 mins ? ? ?Brynn, OTR/L  ?Acute Rehabilitation Services ?Pager: [phone] ?Office: [phone] ?. ? ? ?Jeri Modena ?04/22/2021, 10:06 AM ?

## 2021-04-22 NOTE — Progress Notes (Signed)
PT Cancellation Note ? ?Patient Details ?Name: Jerry Aguirre ?MRN: [id number] ?DOB: 1965-07-18 ? ? ?Cancelled Treatment:    Reason Eval/Treat Not Completed: PT screened, no needs identified, will sign off Per OT, patient modI for mobility with no PT needs. PT will sign off.  ? ?Anicka Stuckert A. Gilford Rile, PT, DPT ?Acute Rehabilitation Services ?Pager [phone] ?Office [phone] ? ? ? ?Jerry Aguirre ?04/22/2021, 10:05 AM ?

## 2021-04-22 NOTE — Plan of Care (Signed)
?  Problem: Education: Goal: Ability to verbalize activity precautions or restrictions will improve Outcome: Completed/Met Goal: Knowledge of the prescribed therapeutic regimen will improve Outcome: Completed/Met Goal: Understanding of discharge needs will improve Outcome: Completed/Met   Problem: Activity: Goal: Ability to avoid complications of mobility impairment will improve Outcome: Completed/Met Goal: Ability to tolerate increased activity will improve Outcome: Completed/Met Goal: Will remain free from falls Outcome: Completed/Met   Problem: Bowel/Gastric: Goal: Gastrointestinal status for postoperative course will improve Outcome: Completed/Met   Problem: Clinical Measurements: Goal: Ability to maintain clinical measurements within normal limits will improve Outcome: Completed/Met Goal: Postoperative complications will be avoided or minimized Outcome: Completed/Met Goal: Diagnostic test results will improve Outcome: Completed/Met   Problem: Pain Management: Goal: Pain level will decrease Outcome: Completed/Met   Problem: Skin Integrity: Goal: Will show signs of wound healing Outcome: Completed/Met   Problem: Health Behavior/Discharge Planning: Goal: Identification of resources available to assist in meeting health care needs will improve Outcome: Completed/Met   Problem: Bladder/Genitourinary: Goal: Urinary functional status for postoperative course will improve Outcome: Completed/Met   Problem: Safety: Goal: Ability to remain free from injury will improve Outcome: Completed/Met   

## 2021-06-07 ENCOUNTER — Other Ambulatory Visit (HOSPITAL_COMMUNITY): Payer: Self-pay | Admitting: Neurosurgery

## 2021-06-07 DIAGNOSIS — M48062 Spinal stenosis, lumbar region with neurogenic claudication: Secondary | ICD-10-CM

## 2021-06-23 ENCOUNTER — Encounter (HOSPITAL_COMMUNITY): Payer: Self-pay

## 2021-06-23 ENCOUNTER — Ambulatory Visit (HOSPITAL_COMMUNITY): Admission: RE | Admit: 2021-06-23 | Payer: Medicaid Other | Source: Ambulatory Visit

## 2021-06-28 NOTE — Progress Notes (Signed)
NEUROLOGY FOLLOW UP OFFICE NOTE  Kavian Peters [id number]  Assessment/Plan:   Lumbar spinal stenosis s/p L4-5 laminectomy B12 deficiency Possible TIA over the summer, managed by his primary neurologist   ***     Subjective:  Haider Apolinar is a 56 year old male with COPD, HTN and HLD who follows up for bilateral leg weakness    UPDATE: Hgb A1c from 07/22/2020 was 6.1.  Labs from 12/23/2020 included TSH 1.582, ferritin 64 and low B12 of 150.  Advised starting B12 1012mg daily.  NCV-EMG of lower extremities on 12/23/2020 was normal.  MRI of lumbar spine on 01/26/2021 personally reviewed showed moderate L4-L5 spinal canal stenosis with left greater than right neural foraminal narrowing and effacement of the lateral recesses likely compressing the L5 nerve roots.  He was referred to neurosurgery and underwent L4-5 laminectomy on 04/21/2021.  ***  HISTORY: He has history of chronic back and leg pain.  He endorses pain consistent with sciatica.  He also has numbness in the feet up the legs.  He feels unsteady on his feet.  Sometimes his knees will suddenly give out on him without warning, causing him to fall.  He also has restless leg.  X-rays of bilateral hips and knees from 03/15/2020 were negative.  X-ray of lumbar spine showed mild degenerative facet disease.  NCV-EMG of lower extremities on 04/08/2020 did not reveal any obvious neuropathy  MRI of brain without contrast on 04/28/2020 showed chronic small vessel ischemic changes in the bilateral cerebral white matter and pons.  MRI of cervical spine showed multilevel cervical spondylosis with mild spinal stenosis at C3-4, moderate left greater than right foraminal stenosis at C4, C5, and C6, and moderate right foraminal stenosis at C7 with no evidence of cord deformity.  [facility]     Of [facility], [facility].  He was found slumped over and  unresponsive by family.  He was noted to have a right facial droop.  Patient claimed that he was just tired.  MRI of brain personally reviewed showed no acute stroke.  CTA head and neck showed no LVO or hemodynamically significant stenosis.  Etiology unclear but TIA suspected.  PAST MEDICAL HISTORY: Past Medical History:  Diagnosis Date   COPD (chronic obstructive pulmonary disease) (HCC)    GERD (gastroesophageal reflux disease)    Headache    Hyperlipidemia    Hypertension    Sleep apnea    pt says his "OSA was mild and they said he did not need a CPAP"    MEDICATIONS: Current Outpatient Medications on File Prior to Visit  Medication Sig Dispense Refill   albuterol (PROVENTIL HFA;VENTOLIN HFA) 108 (90 Base) MCG/ACT inhaler Inhale 2 puffs into the lungs every 6 (six) hours as needed for wheezing or shortness of breath.     amLODipine (NORVASC) 10 MG tablet Take 10 mg by mouth at bedtime.     aspirin EC 81 MG EC tablet Take 1 tablet (81 mg total) by mouth daily. Swallow whole. 30 tablet 11   atorvastatin (LIPITOR) 40 MG tablet Take 1 tablet (40 mg total) by mouth daily at 6 PM. 30 tablet 0   baclofen (LIORESAL) 10 MG tablet Take 10 mg by mouth 2 (two) times daily.     cetirizine (ZYRTEC) 10 MG tablet Take 10 mg by mouth daily.     docusate sodium (COLACE) 100 MG capsule Take 1  capsule (100 mg total) by mouth 2 (two) times daily. 60 capsule 2   furosemide (LASIX) 20 MG tablet Take 20 mg by mouth daily.     lactulose (CHRONULAC) 10 GM/15ML solution Take 10 g by mouth at bedtime.     meloxicam (MOBIC) 7.5 MG tablet Take 7.5 mg by mouth 2 (two) times daily.     methocarbamol (ROBAXIN) 500 MG tablet Take 1 tablet (500 mg total) by mouth every 6 (six) hours as needed for muscle spasms. 20 tablet 0   metoprolol succinate (TOPROL-XL) 25 MG 24 hr tablet Take 1 tablet (25 mg total) by mouth at bedtime. 30 tablet 0   ondansetron (ZOFRAN) 4 MG tablet Take 4 mg by mouth 2 (two) times daily.      oxyCODONE-acetaminophen (PERCOCET) 10-325 MG tablet Take 1 tablet by mouth every 6 (six) hours as needed for pain. 20 tablet 0   pantoprazole (PROTONIX) 40 MG tablet Take 40 mg by mouth 2 (two) times daily.     pramipexole (MIRAPEX) 0.75 MG tablet Take 0.75-1.5 mg by mouth See admin instructions. Take 0.75 mg in the morning and 1.5 mg at night     pregabalin (LYRICA) 150 MG capsule Take 150 mg by mouth at bedtime.     SYMBICORT 80-4.5 MCG/ACT inhaler Inhale 2 puffs into the lungs 2 (two) times daily.     triamterene-hydrochlorothiazide (DYAZIDE) 37.5-25 MG capsule Take 1 capsule by mouth daily.     No current facility-administered medications on file prior to visit.    ALLERGIES: Allergies  Allergen Reactions   Lisinopril Itching    headaches   Lopressor [Metoprolol Tartrate]     Unknown to pt     FAMILY HISTORY: No family history on file.    Objective:  *** General: No acute distress.  Patient appears ***-groomed.   Head:  Normocephalic/atraumatic Eyes:  Fundi examined but not visualized Neck: supple, no paraspinal tenderness, full range of motion Heart:  Regular rate and rhythm Lungs:  Clear to auscultation bilaterally Back: No paraspinal tenderness Neurological Exam: alert and oriented to person, place, and time.  Speech fluent and not dysarthric, language intact.  CN II-XII intact. Bulk and tone normal, muscle strength 5/5 throughout.  Sensation to light touch intact.  Deep tendon reflexes 2+ throughout, toes downgoing.  Finger to nose testing intact.  Gait normal, Romberg negative.   Metta Clines, DO  CC: ***

## 2021-06-29 ENCOUNTER — Encounter: Payer: Self-pay | Admitting: Neurology

## 2021-06-29 ENCOUNTER — Ambulatory Visit (INDEPENDENT_AMBULATORY_CARE_PROVIDER_SITE_OTHER): Payer: Medicaid Other | Admitting: Neurology

## 2021-06-29 ENCOUNTER — Other Ambulatory Visit (INDEPENDENT_AMBULATORY_CARE_PROVIDER_SITE_OTHER): Payer: Medicaid Other

## 2021-06-29 VITALS — BP 112/69 | HR 76 | Ht 66.0 in | Wt 188.6 lb

## 2021-06-29 DIAGNOSIS — E538 Deficiency of other specified B group vitamins: Secondary | ICD-10-CM

## 2021-06-29 DIAGNOSIS — M47812 Spondylosis without myelopathy or radiculopathy, cervical region: Secondary | ICD-10-CM | POA: Diagnosis not present

## 2021-06-29 DIAGNOSIS — G5603 Carpal tunnel syndrome, bilateral upper limbs: Secondary | ICD-10-CM | POA: Diagnosis not present

## 2021-06-29 DIAGNOSIS — G6289 Other specified polyneuropathies: Secondary | ICD-10-CM

## 2021-06-29 DIAGNOSIS — M48062 Spinal stenosis, lumbar region with neurogenic claudication: Secondary | ICD-10-CM

## 2021-06-29 LAB — VITAMIN B12: Vitamin B-12: 203 pg/mL — ABNORMAL LOW (ref 211–911)

## 2021-06-29 NOTE — Patient Instructions (Signed)
Will recheck B12 level. If still low, would recommend getting shots at [facility] or your PCP's office since you are unable to tolerate the pill  Follow up with [facility]

## 2021-06-30 ENCOUNTER — Telehealth: Payer: Self-pay

## 2021-06-30 NOTE — Telephone Encounter (Signed)
-----   Message from Pieter Partridge, DO sent at 06/29/2021  3:54 PM EDT ----- B12 is still low.  Recommend seeing if he can get injections at [facility] neurology or at his PCP's office.

## 2021-06-30 NOTE — Telephone Encounter (Signed)
Pt called with results B12 is still low.  Recommend seeing if he can get injections at [facility] neurology or at his PCP's office. Pt stated he will call them

## 2021-07-08 ENCOUNTER — Ambulatory Visit (HOSPITAL_COMMUNITY)
Admission: RE | Admit: 2021-07-08 | Discharge: 2021-07-08 | Disposition: A | Discharge status: Home / Self Care | Payer: Medicaid Other | Source: Ambulatory Visit | Attending: Neurosurgery | Admitting: Neurosurgery

## 2021-07-08 DIAGNOSIS — M48062 Spinal stenosis, lumbar region with neurogenic claudication: Secondary | ICD-10-CM | POA: Diagnosis present

## 2021-07-08 MED ORDER — GADOBUTROL 1 MMOL/ML IV SOLN
9.0000 mL | Freq: Once | INTRAVENOUS | Status: AC | PRN
  Administered 2021-07-08: 9 mL via INTRAVENOUS

## 2021-07-14 ENCOUNTER — Other Ambulatory Visit (HOSPITAL_BASED_OUTPATIENT_CLINIC_OR_DEPARTMENT_OTHER): Payer: Self-pay

## 2021-07-14 DIAGNOSIS — G4733 Obstructive sleep apnea (adult) (pediatric): Secondary | ICD-10-CM

## 2021-07-20 ENCOUNTER — Encounter: Payer: Self-pay | Admitting: Emergency Medicine

## 2021-07-20 ENCOUNTER — Ambulatory Visit
Admission: EM | Admit: 2021-07-20 | Discharge: 2021-07-20 | Disposition: A | Discharge status: Home / Self Care | Payer: Medicaid Other | Attending: Family Medicine | Admitting: Family Medicine

## 2021-07-20 DIAGNOSIS — M7989 Other specified soft tissue disorders: Secondary | ICD-10-CM | POA: Diagnosis not present

## 2021-07-20 DIAGNOSIS — M79601 Pain in right arm: Secondary | ICD-10-CM

## 2021-07-20 DIAGNOSIS — S80861A Insect bite (nonvenomous), right lower leg, initial encounter: Secondary | ICD-10-CM

## 2021-07-20 DIAGNOSIS — W57XXXA Bitten or stung by nonvenomous insect and other nonvenomous arthropods, initial encounter: Secondary | ICD-10-CM

## 2021-07-20 DIAGNOSIS — R600 Localized edema: Secondary | ICD-10-CM | POA: Diagnosis not present

## 2021-07-20 MED ORDER — CEPHALEXIN 500 MG PO CAPS: 500.0000 mg | ORAL_CAPSULE | Freq: Two times a day (BID) | ORAL | 0 refills | Status: DC

## 2021-07-20 NOTE — ED Triage Notes (Signed)
Right arm swelling x 2 days.  States right leg is swelling. States he can't put pressure on arm.  States he had spinal surgery in April.

## 2021-07-20 NOTE — Discharge Instructions (Signed)
Call [phone] to schedule your ultrasound appointments. If your symptoms worsen please go to the ER immediately. Follow up with Primary Care as soon as able

## 2021-07-21 ENCOUNTER — Telehealth: Payer: Self-pay | Admitting: Family Medicine

## 2021-07-21 DIAGNOSIS — R600 Localized edema: Secondary | ICD-10-CM

## 2021-07-21 NOTE — Telephone Encounter (Signed)
DVT rule out ultrasounds replaced as orders placed yesterday did not crossover

## 2021-07-22 ENCOUNTER — Telehealth: Payer: Self-pay | Admitting: Nurse Practitioner

## 2021-07-22 NOTE — Telephone Encounter (Signed)
Patient requesting ultrasound order to be transferred to [facility] area

## 2021-07-24 NOTE — ED Provider Notes (Signed)
[facility]    CSN: [id number] Arrival date & time: 07/20/21  1831      History   Chief Complaint No chief complaint on file.   HPI Jerry Aguirre is a 56 y.o. male.    Presenting today with 2 day history of right arm pain, swelling in the distal bicep region. Denies redness, numbness, tingling, injury to area, significant weakness, loss of ROM but states movement is excruciating. Also having right leg swelling diffusely beyond baseline edema. Has some wounds from insect bites to right lower leg that have become deeper, more red and painful additionally. Patient states he had spinal surgery in April and is concerned for blood clots to both areas. Denies CP, SOB, palpitations, dizziness. Trying OTC pain relievers with minimal relief. No known hx of DVT, PE. Hx of HTN, COPD, HLD. Current smoker, no recent long periods of travel or hormone supplementation.     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/2020   COPD (chronic obstructive pulmonary disease) ([place]) 07/21/2020   GERD (gastroesophageal reflux disease) 07/21/2020   Essential hypertension 07/21/2020    Past Surgical History:  Procedure Laterality Date   HERNIA REPAIR Right    inguinal   LUMBAR LAMINECTOMY/DECOMPRESSION MICRODISCECTOMY N/A 04/21/2021   Procedure: LAMINECTOMY, MEDIAL FACETECTOMY LUMBAR FOUR- FIVE;  Surgeon: Vallarie Mare, MD;  Location: [place];  Service: Neurosurgery;  Laterality: N/A;   TONSILLECTOMY     removed as a child       Home Medications    Prior to  Admission medications   Medication Sig Start Date End Date Taking? Authorizing Provider  cephALEXin (KEFLEX) 500 MG capsule Take 1 capsule (500 mg total) by mouth 2 (two) times daily. 07/20/21  Yes Volney American, PA-C  albuterol (PROVENTIL HFA;VENTOLIN HFA) 108 (90 Base) MCG/ACT inhaler Inhale 2 puffs into the lungs every 6 (six) hours as needed for wheezing or shortness of breath.    [provider]  amLODipine (NORVASC) 10 MG tablet Take 10 mg by mouth at bedtime.    [provider]  aspirin EC 81 MG EC tablet Take 1 tablet (81 mg total) by mouth daily. Swallow whole. 07/23/20   Debbe Odea, MD  atorvastatin (LIPITOR) 40 MG tablet Take 1 tablet (40 mg total) by mouth daily at 6 PM. 07/22/20   Debbe Odea, MD  baclofen (LIORESAL) 10 MG tablet Take 10 mg by mouth 2 (two) times daily.    [provider]  cetirizine (ZYRTEC) 10 MG tablet Take 10 mg by mouth daily. 06/18/20   [provider]  docusate sodium (COLACE) 100 MG capsule Take 1 capsule (100 mg total) by mouth 2 (two) times daily. 04/21/21 04/21/22  Vallarie Mare, MD  furosemide (LASIX) 20 MG tablet Take 20 mg by mouth daily.    [provider]  lactulose (CHRONULAC) 10 GM/15ML solution Take 10 g by mouth at bedtime. 03/21/21   [provider]  meloxicam (MOBIC) 7.5 MG tablet Take 7.5 mg by  mouth 2 (two) times daily.    [provider]  metoprolol succinate (TOPROL-XL) 25 MG 24 hr tablet Take 1 tablet (25 mg total) by mouth at bedtime. 11/15/17   Julianne Rice, MD  ondansetron (ZOFRAN) 4 MG tablet Take 4 mg by mouth 2 (two) times daily.    [provider]  oxyCODONE-acetaminophen (PERCOCET) 10-325 MG tablet Take 1 tablet by mouth every 6 (six) hours as needed for pain. 04/22/21 04/22/22  Viona Gilmore D, NP  pantoprazole (PROTONIX) 40 MG tablet Take 40 mg by mouth 2 (two) times daily.    [provider]  pramipexole (MIRAPEX) 0.75 MG tablet Take 0.75-1.5 mg  by mouth See admin instructions. Take 0.75 mg in the morning and 1.5 mg at night 03/14/21   [provider]  pregabalin (LYRICA) 75 MG capsule Take 75 mg by mouth at bedtime. 06/17/21   [provider]  SYMBICORT 80-4.5 MCG/ACT inhaler Inhale 2 puffs into the lungs 2 (two) times daily. 01/25/20   [provider]  triamterene-hydrochlorothiazide (DYAZIDE) 37.5-25 MG capsule Take 1 capsule by mouth daily.    [provider]  zolpidem (AMBIEN) 10 MG tablet Take 10 mg by mouth at bedtime as needed. 04/18/21   [provider]    Family History History reviewed. No pertinent family history.  Social History Social History   Tobacco Use   Smoking status: Every Day    Packs/day: 1.00    Types: Cigarettes   Smokeless tobacco: Never  Vaping Use   Vaping Use: Never used  Substance Use Topics   Alcohol use: Not Currently   Drug use: Yes    Types: Marijuana    Comment: twice a week     Allergies   Lisinopril and Lopressor [metoprolol tartrate]   Review of Systems Review of Systems PER HPI  Physical Exam Triage Vital Signs ED Triage Vitals  Enc Vitals Group     BP 07/20/21 1837 118/74     Pulse Rate 07/20/21 1837 75     Resp 07/20/21 1837 18     Temp 07/20/21 1837 98.2 F (36.8 C)     Temp Source 07/20/21 1837 Oral     SpO2 07/20/21 1837 90 %     Weight --      Height --      Head Circumference --      Peak Flow --      Pain Score 07/20/21 1839 10     Pain Loc --      Pain Edu? --      Excl. in [place]? --    No data found.  Updated Vital Signs BP 118/74 (BP Location: Right Arm)   Pulse 75   Temp 98.2 F (36.8 C) (Oral)   Resp 18   SpO2 90%   Visual Acuity Right Eye Distance:   Left Eye Distance:   Bilateral Distance:    Right Eye Near:   Left Eye Near:    Bilateral Near:     Physical Exam Vitals and nursing note reviewed.  Constitutional:      Appearance: Normal appearance.  HENT:     Head: Atraumatic.  Eyes:      Extraocular Movements: Extraocular movements intact.     Conjunctiva/sclera: Conjunctivae normal.  Cardiovascular:     Rate and Rhythm: Normal rate and regular rhythm.  Pulmonary:     Effort: Pulmonary effort is normal. No respiratory distress.     Breath sounds: Normal breath sounds. No wheezing or  rales.  Musculoskeletal:        General: Swelling and tenderness present. No signs of injury. Normal range of motion.     Cervical back: Normal range of motion and neck supple.     Comments: Firm palpable area distal right bicep, otherwise no notable ttp diffusely across RUE B/l LE edema, slightly worse on the right. Neg squeeze test and homans sign. Normal gait and ROM b/l  Skin:    General: Skin is warm and dry.     Comments: Erythematous ulcerated skin lesions RLE with mild surrounding erythema. No fluctuance, induration, drainage  Neurological:     General: No focal deficit present.     Mental Status: He is oriented to person, place, and time.     Comments: B/l UE and LE neurovascularly intact  Psychiatric:        Mood and Affect: Mood normal.        Thought Content: Thought content normal.        Judgment: Judgment normal.      UC Treatments / Results  Labs (all labs ordered are listed, but only abnormal results are displayed) Labs Reviewed - No data to display  EKG   Radiology No results found.  Procedures Procedures (including critical care time)  Medications Ordered in UC Medications - No data to display  Initial Impression / Assessment and Plan / UC Course  I have reviewed the triage vital signs and the nursing notes.  Pertinent labs & imaging results that were available during my care of the patient were reviewed by me and considered in my medical decision making (see chart for details).     Lower suspicion for DVT in either extremity due to exam findings, however patient very concerned so will place orders for RUE and RLE dvt r/o u/s and provide information  for scheduling. RUE more consistent with bicep strain or similar injury, RLE appearing to become infected in areas of insect bites/ulceration so will cover with keflex and discussed RICE protocol, close f/u with PCP. ED for worsening sxs at any time.   Final Clinical Impressions(s) / UC Diagnoses   Final diagnoses:  Right arm pain  Swelling of upper arm  Leg edema, right  Insect bite of right lower leg, initial encounter     Discharge Instructions      Call [phone] to schedule your ultrasound appointments. If your symptoms worsen please go to the ER immediately. Follow up with Primary Care as soon as able    ED Prescriptions     Medication Sig Dispense Auth. Provider   cephALEXin (KEFLEX) 500 MG capsule Take 1 capsule (500 mg total) by mouth 2 (two) times daily. 14 capsule Volney American, [place]      PDMP not reviewed this encounter.   Volney American, [place] 07/24/21 2100

## 2021-07-27 ENCOUNTER — Ambulatory Visit (INDEPENDENT_AMBULATORY_CARE_PROVIDER_SITE_OTHER): Payer: Medicaid Other | Admitting: Internal Medicine

## 2021-07-27 ENCOUNTER — Encounter: Payer: Self-pay | Admitting: Internal Medicine

## 2021-07-27 ENCOUNTER — Ambulatory Visit (INDEPENDENT_AMBULATORY_CARE_PROVIDER_SITE_OTHER): Payer: Medicaid Other

## 2021-07-27 DIAGNOSIS — J4489 Other specified chronic obstructive pulmonary disease: Secondary | ICD-10-CM | POA: Insufficient documentation

## 2021-07-27 DIAGNOSIS — J449 Chronic obstructive pulmonary disease, unspecified: Secondary | ICD-10-CM

## 2021-07-27 DIAGNOSIS — F17210 Nicotine dependence, cigarettes, uncomplicated: Secondary | ICD-10-CM

## 2021-07-27 DIAGNOSIS — G4736 Sleep related hypoventilation in conditions classified elsewhere: Secondary | ICD-10-CM

## 2021-07-27 LAB — CBC WITH DIFFERENTIAL/PLATELET
Basophils Absolute: 0.1 10*3/uL (ref 0.0–0.1)
Basophils Relative: 1.1 % (ref 0.0–3.0)
Eosinophils Absolute: 0.3 10*3/uL (ref 0.0–0.7)
Eosinophils Relative: 4 % (ref 0.0–5.0)
HCT: 44.9 % (ref 39.0–52.0)
Hemoglobin: 15 g/dL (ref 13.0–17.0)
Lymphocytes Relative: 22.4 % (ref 12.0–46.0)
Lymphs Abs: 1.6 10*3/uL (ref 0.7–4.0)
MCHC: 33.5 g/dL (ref 30.0–36.0)
MCV: 90.5 fl (ref 78.0–100.0)
Monocytes Absolute: 0.8 10*3/uL (ref 0.1–1.0)
Monocytes Relative: 11 % (ref 3.0–12.0)
Neutro Abs: 4.3 10*3/uL (ref 1.4–7.7)
Neutrophils Relative %: 61.5 % (ref 43.0–77.0)
Platelets: 263 10*3/uL (ref 150.0–400.0)
RBC: 4.96 Mil/uL (ref 4.22–5.81)
RDW: 13.9 % (ref 11.5–15.5)
WBC: 7 10*3/uL (ref 4.0–10.5)

## 2021-07-27 NOTE — Progress Notes (Signed)
Jerry Aguirre, male    DOB: 1965-01-27   MRN: [id number]   Brief patient profile:  22   yowm active smoker  referred to pulmonary clinic 07/27/2021 by NP [name] in [place]  for breathing         History of Present Illness  07/27/2021  Pulmonary/ 1st office eval/Kanijah Groseclose  Chief Complaint  Patient presents with   Consult    Sob with exertion x 2 yrs., cough-green occass.,o2 sats 64-70's occass.  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    Also denies any obvious fluctuation of symptoms with weather or environmental changes or other aggravating or alleviating factors except as outlined above   No unusual exposure hx or h/o childhood pna/ asthma or knowledge of premature birth.  Current Allergies, Complete Past Medical History, Past Surgical History, Family History, and Social History were reviewed in [employer] record.  ROS  The following are not active complaints unless bolded Hoarseness, sore throat, dysphagia, dental problems, itching, sneezing,  nasal congestion or discharge of excess mucus or purulent secretions, ear ache,   fever, chills, sweats, unintended wt loss or wt gain, classically pleuritic or exertional cp,  orthopnea pnd or arm/hand swelling  or leg swelling, presyncope, palpitations, abdominal pain, anorexia, nausea, vomiting, diarrhea  or change in bowel habits or change in bladder habits, change in stools or change in urine, dysuria,  hematuria,  rash, arthralgias, visual complaints, headache, numbness, weakness or ataxia or problems with walking or coordination,  change in mood or  memory.             Past Medical History:  Diagnosis Date   COPD (chronic obstructive pulmonary disease) (HCC)    GERD (gastroesophageal reflux disease)    Headache    Hyperlipidemia    Hypertension    Sleep apnea    pt says his "OSA was mild and they said he did not need a CPAP"    Outpatient Medications Prior to Visit  Medication Sig Dispense Refill   albuterol (PROVENTIL HFA;VENTOLIN HFA) 108 (90 Base) MCG/ACT inhaler Inhale 2 puffs into the lungs every 6 (six) hours as needed for wheezing or shortness of breath.     amLODipine (NORVASC) 10 MG tablet Take 10 mg by mouth at bedtime.     aspirin EC 81 MG EC tablet Take 1 tablet (81 mg total) by mouth daily. Swallow whole. 30 tablet 11   atorvastatin (LIPITOR) 40 MG tablet Take 1 tablet (40 mg total) by mouth daily at 6 PM. 30 tablet 0   baclofen (LIORESAL) 10 MG tablet Take 10 mg by mouth 2 (two) times daily.     cetirizine (ZYRTEC) 10 MG tablet Take 10 mg by mouth daily.     docusate sodium (COLACE) 100 MG capsule Take 1 capsule (100 mg total) by mouth 2 (two) times daily. 60 capsule 2  furosemide (LASIX) 20 MG tablet Take 20 mg by mouth daily.     lactulose (CHRONULAC) 10 GM/15ML solution Take 10 g by mouth at bedtime.     meloxicam (MOBIC) 7.5 MG tablet Take 7.5 mg by mouth 2 (two) times daily.     metoprolol succinate (TOPROL-XL) 25 MG 24 hr tablet Take 1 tablet (25 mg total) by mouth at bedtime. 30 tablet 0   ondansetron (ZOFRAN) 4 MG tablet Take 4 mg by mouth 2 (two) times daily.     oxyCODONE-acetaminophen (PERCOCET) 10-325 MG tablet Take 1 tablet by mouth every 6 (six) hours as needed for pain. 20 tablet 0   pantoprazole (PROTONIX) 40 MG tablet Take 40 mg by mouth 2 (two) times daily.     pramipexole (MIRAPEX) 0.75 MG tablet Take 0.75-1.5 mg by mouth See admin instructions.  Take 0.75 mg in the morning and 1.5 mg at night     pregabalin (LYRICA) 75 MG capsule Take 75 mg by mouth at bedtime.     SYMBICORT 80-4.5 MCG/ACT inhaler Inhale 2 puffs into the lungs 2 (two) times daily.     triamterene-hydrochlorothiazide (DYAZIDE) 37.5-25 MG capsule Take 1 capsule by mouth daily.     zolpidem (AMBIEN) 10 MG tablet Take 10 mg by mouth at bedtime as needed.     cephALEXin (KEFLEX) 500 MG capsule Take 1 capsule (500 mg total) by mouth 2 (two) times daily. (Patient not taking: Reported on 07/27/2021) 14 capsule 0   No facility-administered medications prior to visit.     Objective:     BP 128/78 (BP Location: Left Arm, Cuff Size: Normal)   Pulse 75   Temp 97.8 F (36.6 C) (Temporal)   Ht '5\' 4"'$  (1.626 m)   Wt 191 lb 3.2 oz (86.7 kg)   SpO2 97% Comment: RA  BMI 32.82 kg/m   SpO2: 97 % (RA)  Amb wm nad   HEENT : Oropharynx  nl   Nasal turbinates nl    NECK :  without  apparent JVD/ palpable Nodes/TM    LUNGS: no acc muscle use,  Mild barrel  contour chest wall with bilateral  Distant bs s audible wheeze and  without cough on insp or exp maneuvers  and mild  Hyperresonant  to  percussion bilaterally     CV:  RRR  no s3 or murmur or increase in P2, and no edema   ABD:  soft and nontender with pos end  insp Hoover's  in the supine position.  No bruits or organomegaly appreciated   MS:  Nl gait/ ext warm without deformities Or obvious joint restrictions  calf tenderness, cyanosis or clubbing     SKIN: warm and dry without lesions    NEURO:  alert, approp, nl sensorium with  no motor or cerebellar deficits apparent.    CXR PA and Lateral:   07/27/2021 :    I personally reviewed images and impression is as follows:     Mild copd changes/ nothing acute  Labs ordered 07/27/2021  :  allergy profile   alpha one AT phenotype       Assessment   COPD  GOLD 3 Active smoking Spirometry 07/27/2021  FEV1 1.4 (46%)  Ratio 0.63 and mildly concave   - 07/27/2021    Walked on RA  x  3  lap(s) =  approx 750  ft  @ avg/cane pace, stopped due to end of study with L Leg pain with lowest 02 sats  96%  Labs ordered 07/27/2021  :  Allergy screen    alpha one AT phenotype - 07/27/2021 trial of symbicort 80 2bid since cough > doe     He has more of an AB pattern clinically and barely GOLD 3 so rx with low dose symb 2 bid for now and f/u in 6 weeks > low threshold to convert over to [employer] if insurance will cover.   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 Whether adjustments of cpap can improve desats and reduce daytime hypersomnolence.  In meantime, advised not to drive a car  Cigarette smoker Counseled re importance of smoking cessation but did not meet time criteria for separate billing     Each maintenance medication was reviewed in detail including emphasizing most importantly the difference between maintenance and prns and under what circumstances the prns are to be triggered using an action plan format where appropriate.  Total time for H and P, chart review, counseling, reviewing hfa device(s) , directly observing portions of ambulatory 02 saturation study/ and generating customized AVS unique to this office visit / same day charting  > 45 min new pt eval                  Christinia Gully, MD 07/27/2021

## 2021-07-27 NOTE — Assessment & Plan Note (Addendum)
Counseled re importance of smoking cessation but did not meet time criteria for separate billing     Each maintenance medication was reviewed in detail including emphasizing most importantly the difference between maintenance and prns and under what circumstances the prns are to be triggered using an action plan format where appropriate.  Total time for H and P, chart review, counseling, reviewing hfa device(s) , directly observing portions of ambulatory 02 saturation study/ and generating customized AVS unique to this office visit / same day charting  > 45 min new pt eval

## 2021-07-27 NOTE — Assessment & Plan Note (Signed)
Active smoking Spirometry 07/27/2021  FEV1 1.4 (46%)  Ratio 0.63 and mildly concave   - 07/27/2021   Walked on RA  x  3  lap(s) =  approx 750  ft  @ avg/cane pace, stopped due to end of study with L Leg pain with lowest 02 sats  96%  Labs ordered 07/27/2021  :  Allergy screen    alpha one AT phenotype - 07/27/2021 trial of symbicort 80 2bid since cough > doe     He has more of an AB pattern clinically and barely GOLD 3 so rx with low dose symb 2 bid for now and f/u in 6 weeks > low threshold to convert over to [employer] if insurance will cover.

## 2021-07-27 NOTE — Patient Instructions (Addendum)
My office will be contacting you by phone for referral to sleep medicine in [place] and you should not drive until this problem is corrected   Plan A = Automatic = Always=    symbicort 80 Take 2 puffs first thing in am and then another 2 puffs about 12 hours later.   Work on inhaler technique:  relax and gently blow all the way out then take a nice smooth full deep breath back in, triggering the inhaler at same time you start breathing in.  Hold for up to 5 seconds if you can. Blow out thru nose. Rinse and gargle with water when done.  If mouth or throat bother you at all,  try brushing teeth/gums/tongue with arm and hammer toothpaste/ make a slurry and gargle and spit out.      Plan B = Backup (to supplement plan A, not to replace it) Only use your albuterol inhaler as a rescue medication to be used if you can't catch your breath by resting or doing a relaxed purse lip breathing pattern.  - The less you use it, the better it will work when you need it. - Ok to use the inhaler up to 2 puffs  every 4 hours if you must but call for appointment if use goes up over your usual need - Don't leave home without it !!  (think of it like the spare tire for your car)    Make sure you check your oxygen saturation  AT  your highest level of activity (not after you stop)   to be sure it stays over 90% and adjust  02 flow upward to maintain this level if needed but remember to turn it back to previous settings when you stop (to conserve your supply).   Please remember to go to the lab and x-ray department  for your tests - we will call you with the results when they are available.        Please schedule a follow up office visit in 6 weeks, call sooner if needed with all medications /inhalers/ solutions in hand so we can verify exactly what you are taking. This includes all medications from all doctors and over the counters

## 2021-07-27 NOTE — Assessment & Plan Note (Signed)
As of 07/27/2021 2.5 lpm hs and no desats walking - referred to sleep medicine 07/27/2021 >>>   Apparently only has mild OSA but reported to have severe desats on RA so needs reassessment with ? Whether adjustments of cpap can improve desats and reduce daytime hypersomnolence.  In meantime, advised not to drive a car

## 2021-07-28 LAB — IGE: IgE (Immunoglobulin E), Serum: 462 kU/L — ABNORMAL HIGH (ref <=114)

## 2021-07-29 LAB — ALPHA-1-ANTITRYPSIN PHENOTYP: A-1 Antitrypsin: 158 mg/dL (ref 101–187)

## 2021-07-29 NOTE — Progress Notes (Signed)
Spoke with pt's sister, Margarito Courser, ok per DPR and notified of results and she verbalized understanding and will inform the pt. Nothing further needed.

## 2021-08-02 ENCOUNTER — Ambulatory Visit: Payer: Medicaid Other

## 2021-08-02 ENCOUNTER — Ambulatory Visit (INDEPENDENT_AMBULATORY_CARE_PROVIDER_SITE_OTHER): Payer: Medicaid Other

## 2021-08-02 DIAGNOSIS — M79621 Pain in right upper arm: Secondary | ICD-10-CM

## 2021-08-02 DIAGNOSIS — R600 Localized edema: Secondary | ICD-10-CM | POA: Diagnosis not present

## 2021-09-08 ENCOUNTER — Emergency Department (HOSPITAL_COMMUNITY)
Admission: EM | Admit: 2021-09-08 | Discharge: 2021-09-08 | Disposition: A | Discharge status: Home / Self Care | Payer: Medicaid Other | Attending: Emergency Medicine | Admitting: Emergency Medicine

## 2021-09-08 ENCOUNTER — Other Ambulatory Visit: Payer: Self-pay

## 2021-09-08 ENCOUNTER — Emergency Department (HOSPITAL_COMMUNITY): Payer: Medicaid Other

## 2021-09-08 ENCOUNTER — Encounter (HOSPITAL_COMMUNITY): Payer: Self-pay | Admitting: Emergency Medicine

## 2021-09-08 DIAGNOSIS — Z79899 Other long term (current) drug therapy: Secondary | ICD-10-CM | POA: Insufficient documentation

## 2021-09-08 DIAGNOSIS — R519 Headache, unspecified: Secondary | ICD-10-CM | POA: Insufficient documentation

## 2021-09-08 DIAGNOSIS — G8929 Other chronic pain: Secondary | ICD-10-CM | POA: Insufficient documentation

## 2021-09-08 DIAGNOSIS — Z7982 Long term (current) use of aspirin: Secondary | ICD-10-CM | POA: Diagnosis not present

## 2021-09-08 DIAGNOSIS — M545 Low back pain, unspecified: Secondary | ICD-10-CM | POA: Diagnosis present

## 2021-09-08 DIAGNOSIS — R531 Weakness: Secondary | ICD-10-CM | POA: Insufficient documentation

## 2021-09-08 LAB — CBC
HCT: 48.5 % (ref 39.0–52.0)
Hemoglobin: 15.9 g/dL (ref 13.0–17.0)
MCH: 30.4 pg (ref 26.0–34.0)
MCHC: 32.8 g/dL (ref 30.0–36.0)
MCV: 92.7 fL (ref 80.0–100.0)
Platelets: 222 10*3/uL (ref 150–400)
RBC: 5.23 MIL/uL (ref 4.22–5.81)
RDW: 13.6 % (ref 11.5–15.5)
WBC: 8.3 10*3/uL (ref 4.0–10.5)
nRBC: 0 % (ref 0.0–0.2)

## 2021-09-08 LAB — BASIC METABOLIC PANEL
Anion gap: 13 (ref 5–15)
BUN: 23 mg/dL — ABNORMAL HIGH (ref 6–20)
CO2: 29 mmol/L (ref 22–32)
Calcium: 9.7 mg/dL (ref 8.9–10.3)
Chloride: 96 mmol/L — ABNORMAL LOW (ref 98–111)
Creatinine, Ser: 1.23 mg/dL (ref 0.61–1.24)
GFR, Estimated: >60 mL/min (ref >60)
Glucose, Bld: 127 mg/dL — ABNORMAL HIGH (ref 70–99)
Potassium: 3.9 mmol/L (ref 3.5–5.1)
Sodium: 138 mmol/L (ref 135–145)

## 2021-09-08 LAB — URINALYSIS, ROUTINE W REFLEX MICROSCOPIC
Bilirubin Urine: NEGATIVE
Glucose, UA: NEGATIVE mg/dL
Hgb urine dipstick: NEGATIVE
Ketones, ur: NEGATIVE mg/dL
Leukocytes,Ua: NEGATIVE
Nitrite: NEGATIVE
Protein, ur: NEGATIVE mg/dL
Specific Gravity, Urine: 1.012 (ref 1.005–1.030)
pH: 6 (ref 5.0–8.0)

## 2021-09-08 LAB — TROPONIN I (HIGH SENSITIVITY): Troponin I (High Sensitivity): 4 ng/L (ref <18)

## 2021-09-08 LAB — CBG MONITORING, ED: Glucose-Capillary: 150 mg/dL — ABNORMAL HIGH (ref 70–99)

## 2021-09-08 MED ORDER — KETOROLAC TROMETHAMINE 15 MG/ML IJ SOLN
15.0000 mg | Freq: Once | INTRAMUSCULAR | Status: AC
  Administered 2021-09-08: 15 mg via INTRAVENOUS
  Filled 2021-09-08: qty 1

## 2021-09-08 MED ORDER — DIPHENHYDRAMINE HCL 50 MG/ML IJ SOLN
25.0000 mg | Freq: Once | INTRAMUSCULAR | Status: AC
Start: 2021-09-08 — End: 2021-09-08
  Administered 2021-09-08: 25 mg via INTRAVENOUS
  Filled 2021-09-08: qty 1

## 2021-09-08 MED ORDER — OXYCODONE HCL 5 MG PO TABS
5.0000 mg | ORAL_TABLET | Freq: Once | ORAL | Status: AC
  Administered 2021-09-08: 5 mg via ORAL
  Filled 2021-09-08: qty 1

## 2021-09-08 MED ORDER — METOCLOPRAMIDE HCL 5 MG/ML IJ SOLN
10.0000 mg | Freq: Once | INTRAMUSCULAR | Status: AC
Start: 2021-09-08 — End: 2021-09-08
  Administered 2021-09-08: 10 mg via INTRAVENOUS
  Filled 2021-09-08: qty 2

## 2021-09-08 MED ORDER — OXYCODONE-ACETAMINOPHEN 5-325 MG PO TABS
1.0000 | ORAL_TABLET | Freq: Once | ORAL | Status: AC
  Administered 2021-09-08: 1 via ORAL
  Filled 2021-09-08: qty 1

## 2021-09-08 MED ORDER — LACTATED RINGERS IV BOLUS
1000.0000 mL | Freq: Once | INTRAVENOUS | Status: AC
  Administered 2021-09-08: 1000 mL via INTRAVENOUS

## 2021-09-08 NOTE — ED Provider Notes (Signed)
[facility] EMERGENCY DEPARTMENT Provider Note   CSN: [id number] Arrival date & time: 09/08/21  1226     History  Chief Complaint  Patient presents with   Back Pain    Jerry Aguirre is a 56 y.o. male.  HPI 56 year old male presents with a chief complaint of headache.  He woke up at 11:15 AM with the headache.  When he went to bed last night he felt fine.  He felt like his forehead was hot this morning but he did not have a fever.  He has had a throbbing frontal headache.  He used to get a lot of headaches but this got better after his back surgery.  He has chronic neck pain and chronic back pain, both of which are worse today.  Chronic numbness and tingling to his legs that is similar.  He has bilateral leg weakness that is near baseline as well.  No incontinence.  No abdominal pain, shortness of breath, cough or sore throat.  He feels like his vision was blurry.  He felt like he was off balance, especially when he first woke up though that has improved.  He also notes nonspecific chest "hurting" this started when he first woke up and is still present.  He feels like his chest is sore to the touch and when he lifts his left arm it makes the pain get better.  He has not yet taken his oxycodone as he was planning to come to the emergency department and was not sure if he could take meds.  Home Medications Prior to Admission medications   Medication Sig Start Date End Date Taking? Authorizing Provider  albuterol (PROVENTIL HFA;VENTOLIN HFA) 108 (90 Base) MCG/ACT inhaler Inhale 2 puffs into the lungs every 6 (six) hours as needed for wheezing or shortness of breath.    [provider]  amLODipine (NORVASC) 10 MG tablet Take 10 mg by mouth at bedtime.    [provider]  aspirin EC 81 MG EC tablet Take 1 tablet (81 mg total) by mouth daily. Swallow whole. 07/23/20   Debbe Odea, MD  atorvastatin (LIPITOR) 40 MG tablet Take 1 tablet (40 mg total) by mouth daily at 6 PM. 07/22/20    Debbe Odea, MD  baclofen (LIORESAL) 10 MG tablet Take 10 mg by mouth 2 (two) times daily.    [provider]  cetirizine (ZYRTEC) 10 MG tablet Take 10 mg by mouth daily. 06/18/20   [provider]  docusate sodium (COLACE) 100 MG capsule Take 1 capsule (100 mg total) by mouth 2 (two) times daily. 04/21/21 04/21/22  Vallarie Mare, MD  furosemide (LASIX) 20 MG tablet Take 20 mg by mouth daily.    [provider]  lactulose (CHRONULAC) 10 GM/15ML solution Take 10 g by mouth at bedtime. 03/21/21   [provider]  meloxicam (MOBIC) 7.5 MG tablet Take 7.5 mg by mouth 2 (two) times daily.    [provider]  metoprolol succinate (TOPROL-XL) 25 MG 24 hr tablet Take 1 tablet (25 mg total) by mouth at bedtime. 11/15/17   Julianne Rice, MD  ondansetron (ZOFRAN) 4 MG tablet Take 4 mg by mouth 2 (two) times daily.    [provider]  oxyCODONE-acetaminophen (PERCOCET) 10-325 MG tablet Take 1 tablet by mouth every 6 (six) hours as needed for pain. 04/22/21 04/22/22  Viona Gilmore D, NP  pantoprazole (PROTONIX) 40 MG tablet Take 40 mg by mouth 2 (two) times daily.    [provider]  pramipexole (MIRAPEX) 0.75 MG tablet Take 0.75-1.5 mg by mouth See admin instructions. Take 0.75 mg in the morning and 1.5 mg at night 03/14/21   [provider]  pregabalin (LYRICA) 150 MG capsule Take 150 mg by mouth at bedtime. 09/07/21   [provider]  pregabalin (LYRICA) 75 MG capsule Take 75 mg by mouth at bedtime. 06/17/21   [provider]  SYMBICORT 80-4.5 MCG/ACT inhaler Inhale 2 puffs into the lungs 2 (two) times daily. 01/25/20   [provider]  triamterene-hydrochlorothiazide (DYAZIDE) 37.5-25 MG capsule Take 1 capsule by mouth daily.    [provider]  zolpidem (AMBIEN) 10 MG tablet Take 10 mg by mouth at bedtime as needed. 04/18/21   [provider]      Allergies    Lisinopril and Lopressor [metoprolol  tartrate]    Review of Systems   Review of Systems  HENT:  Negative for sore throat.   Eyes:  Positive for visual disturbance.  Respiratory:  Negative for cough and shortness of breath.   Cardiovascular:  Positive for chest pain.  Gastrointestinal:  Positive for vomiting (3 episodes today). Negative for abdominal pain and diarrhea.  Musculoskeletal:  Positive for back pain and neck pain.  Neurological:  Positive for dizziness, weakness, numbness and headaches.    Physical Exam Updated Vital Signs BP (!) 132/92   Pulse 79   Temp 98 F (36.7 C)   Resp 18   Ht '5\' 3"'$  (1.6 m)   Wt 130.6 kg   SpO2 95%   BMI 51.02 kg/m  Physical Exam Vitals and nursing note reviewed.  Constitutional:      Appearance: He is well-developed.  HENT:     Head: Normocephalic and atraumatic.     Right Ear: External ear normal.     Left Ear: External ear normal.     Nose: Nose normal.  Eyes:     General:        Right eye: No discharge.        Left eye: No discharge.     Extraocular Movements: Extraocular movements intact.     Pupils: Pupils are equal, round, and reactive to light.  Cardiovascular:     Rate and Rhythm: Normal rate and regular rhythm.     Heart sounds: Normal heart sounds.  Pulmonary:     Effort: Pulmonary effort is normal.     Breath sounds: Normal breath sounds.  Chest:     Chest wall: Tenderness present.  Abdominal:     Palpations: Abdomen is soft.     Tenderness: There is no abdominal tenderness.  Musculoskeletal:     Cervical back: Normal range of motion and neck supple. Tenderness present. No rigidity. Spinous process tenderness and muscular tenderness present.     Thoracic back: No tenderness.     Lumbar back: Tenderness present.       Back:  Skin:    General: Skin is warm and dry.  Neurological:     Mental Status: He is alert.     Comments: CN 3-12 grossly intact. 5/5 strength in all 4 extremities. Grossly normal sensation. Normal finger to nose. Able to ambulate  slowly with his cane, feels at his baseline.  Psychiatric:        Mood and Affect: Mood is not anxious.     ED Results / Procedures / Treatments   Labs (all labs ordered are listed, but only abnormal results are displayed) Labs Reviewed  BASIC METABOLIC PANEL -  Abnormal; Notable for the following components:      Result Value   Chloride 96 (*)    Glucose, Bld 127 (*)    BUN 23 (*)    All other components within normal limits  CBG MONITORING, ED - Abnormal; Notable for the following components:   Glucose-Capillary 150 (*)    All other components within normal limits  RESP PANEL BY RT-PCR (FLU A&B, COVID) ARPGX2  CBC  URINALYSIS, ROUTINE W REFLEX MICROSCOPIC  TROPONIN I (HIGH SENSITIVITY)    EKG EKG Interpretation  Date/Time:  Thursday September 08 2021 13:04:12 EDT Ventricular Rate:  94 PR Interval:  182 QRS Duration: 90 QT Interval:  356 QTC Calculation: 445 R Axis:   87 Text Interpretation: Normal sinus rhythm Septal infarct , age undetermined  rate is faster, otherwise similar to July 2022 Confirmed by Sherwood Gambler [phone]) on 09/08/2021 3:49:10 PM  Radiology DG Chest 2 View  Result Date: 09/08/2021 CLINICAL DATA:  Chest pain. Headache with low back pain and vomiting. EXAM: CHEST - 2 VIEW COMPARISON:  Radiographs 07/27/2021 and 11/15/2017. FINDINGS: The heart size and mediastinal contours are stable. The lungs appear unchanged with an azygous fissure and possible mild chronic central airway thickening. There is no edema, confluent airspace opacity, pleural effusion or pneumothorax. The bones appear unchanged. IMPRESSION: Stable radiographic appearance of the chest. No active cardiopulmonary process. Electronically Signed   By: Richardean Sale M.D.   On: 09/08/2021 16:33   CT Head Wo Contrast  Result Date: 09/08/2021 CLINICAL DATA:  Headaches, vomiting EXAM: CT HEAD WITHOUT CONTRAST TECHNIQUE: Contiguous axial images were obtained from the base of the skull through the  vertex without intravenous contrast. RADIATION DOSE REDUCTION: This exam was performed according to the departmental dose-optimization program which includes automated exposure control, adjustment of the mA and/or kV according to patient size and/or use of iterative reconstruction technique. COMPARISON:  07/22/2020 FINDINGS: Brain: No acute intracranial findings are seen. Ventricles are not dilated. There are no signs of bleeding within the cranium. There is no focal edema or mass effect. Vascular: Unremarkable. Skull: Unremarkable. Sinuses/Orbits: There is a minimal mucosal thickening in the ethmoid sinus. There is possible small old blowout fracture in the medial wall of right orbit. This finding has not changed. Other: None. IMPRESSION: No acute intracranial findings are seen in noncontrast CT brain. Electronically Signed   By: Elmer Picker M.D.   On: 09/08/2021 14:28    Procedures Procedures    Medications Ordered in ED Medications  oxyCODONE (Oxy IR/ROXICODONE) immediate release tablet 5 mg (5 mg Oral Given 09/08/21 1630)  oxyCODONE-acetaminophen (PERCOCET/ROXICET) 5-325 MG per tablet 1 tablet (1 tablet Oral Given 09/08/21 1630)  metoCLOPramide (REGLAN) injection 10 mg (10 mg Intravenous Given 09/08/21 1646)  diphenhydrAMINE (BENADRYL) injection 25 mg (25 mg Intravenous Given 09/08/21 1646)  ketorolac (TORADOL) 15 MG/ML injection 15 mg (15 mg Intravenous Given 09/08/21 1647)  lactated ringers bolus 1,000 mL (0 mLs Intravenous Stopped 09/08/21 1855)    ED Course/ Medical Decision Making/ A&P                           Medical Decision Making Amount and/or Complexity of Data Reviewed Labs: ordered. Radiology: ordered.  Risk Prescription drug management.   Patient presents with acute headache. Started ~ 3 hours prior to arrival. Head CT unremarkable. I have personally viewed/interpreted these images. Thus my suspicion of SAH is pretty low with negative CT within 6 hours of  onset. No  meningismus. Low suspicion for stroke, venous sinus thrombosis, etc. He used to get headaches like this often, but this is the first in a while. Headache has resolved with IV treatment in ED.   He also is endorsing nonspecific chest pain. Troponin ordered, and is negative around 5 hours after symptoms. This combined with benign EKG, pretty atypical history. I think ACS, PE, Dissection, etc is all unlikely.  He also has chronic back pain. No new numbness/weakness. No incontinence. I doubt acute spinal cord emergency.  Overall, no apparent emergent condition. Stable for d/c. Follow up with PCP. Given return precautions.         Final Clinical Impression(s) / ED Diagnoses Final diagnoses:  Bad headache    Rx / DC Orders ED Discharge Orders     None         Sherwood Gambler, MD 09/09/21 [id number]

## 2021-09-08 NOTE — Discharge Instructions (Signed)
If you develop continued, recurrent, or worsening headache, fever, neck stiffness, vomiting, blurry or double vision, weakness or numbness in your arms or legs, trouble speaking, or any other new/concerning symptoms then return to the ER for evaluation.  

## 2021-09-08 NOTE — ED Triage Notes (Signed)
Pt presents headache, lower back pain, vomiting and numbness in his legs that started around 11 am.

## 2021-09-12 ENCOUNTER — Ambulatory Visit: Payer: Medicaid Other | Admitting: Internal Medicine

## 2021-09-12 NOTE — Progress Notes (Deleted)
Jerry Aguirre, male    DOB: 08/16/1965   MRN: [id number]   Brief patient profile:  29   yowm active smoker/MM  referred to pulmonary clinic 07/27/2021 by NP [name] in [place]  for breathing         History of Present Illness  07/27/2021  Pulmonary/ 1st office eval/Jerry Aguirre  Chief Complaint  Patient presents with   Consult    Sob with exertion x 2 yrs., cough-green occass.,o2 sats 64-70's occass.  Dyspnea:  walks with crutch due to knees / walking to chicken house and up the ramp the house  Push mower x 5 min / [employer] from one end of store to other  Cough: worse in am / sometimes green on abx day 7 keflex  Sleep: no cpap / 2.5 lpm hs x 2009 and hypersomnolent, seeing neuro for cpap but not able to work out sleep issues/cpap effectiveness so far SABA use: four times a day  plus symb Rec My office will be contacting you by phone for referral to sleep medicine in [place] and you should not drive until this problem is corrected > not seen as of 09/12/2021 and not on schedule to see  Plan A = Automatic = Always=    symbicort 80 Take 2 puffs first thing in am and then another 2 puffs about 12 hours later.  Work on inhaler technique: Plan B = Backup (to supplement plan A, not to replace it) Only use your albuterol inhaler as a rescue medication  Make sure you check your oxygen saturation  AT  your highest level of activity (not after you stop)   to be sure it stays over 90% Please remember to go to the lab   Allergy screen 0.3/   IgE 462    alpha one AT phenotype  MM  Level 158      Please schedule a follow up office visit in 6 weeks, call sooner if needed with all medications /inhalers/ solutions in hand      09/12/2021  f/u ov/Jerry Aguirre office/David Rodriquez re: *** maint on ***  No chief complaint on file.   Dyspnea:  *** Cough: *** Sleeping: *** SABA use: *** 02: *** Covid status: *** Lung cancer screening: ***   No obvious day to day or daytime variability or assoc excess/ purulent  sputum or mucus plugs or hemoptysis or cp or chest tightness, subjective wheeze or overt sinus or hb symptoms.   *** without nocturnal  or early am exacerbation  of respiratory  c/o's or need for noct saba. Also denies any obvious fluctuation of symptoms with weather or environmental changes or other aggravating or alleviating factors except as outlined above   No unusual exposure hx or h/o childhood pna/ asthma or knowledge of premature birth.  Current Allergies, Complete Past Medical History, Past Surgical History, Family History, and Social History were reviewed in [employer] record.  ROS  The following are not active complaints unless bolded Hoarseness, sore throat, dysphagia, dental problems, itching, sneezing,  nasal congestion or discharge of excess mucus or purulent secretions, ear ache,   fever, chills, sweats, unintended wt loss or wt gain, classically pleuritic or exertional cp,  orthopnea pnd or arm/hand swelling  or leg swelling, presyncope, palpitations, abdominal pain, anorexia, nausea, vomiting, diarrhea  or change in bowel habits or change in bladder habits, change in stools or change in urine, dysuria, hematuria,  rash, arthralgias, visual complaints, headache, numbness, weakness or ataxia or problems with walking or coordination,  change in mood or  memory.        No outpatient medications have been marked as taking for the 09/12/21 encounter (Appointment) with Tanda Rockers, MD.                         Past Medical History:  Diagnosis Date   COPD (chronic obstructive pulmonary disease) ([place])    GERD (gastroesophageal reflux disease)    Headache    Hyperlipidemia    Hypertension    Sleep apnea    pt says his "OSA was mild and they said he did not need a CPAP"      Objective:     Vital signs reviewed  09/12/2021  - Note at rest 02 sats  ***% on ***   General appearance:    ***   Amb wm nad     Mild bar ***  Labs ordered 07/27/2021   :  allergy profile   alpha one AT phenotype       Assessment          07/27/2021

## 2021-09-13 ENCOUNTER — Ambulatory Visit (HOSPITAL_COMMUNITY)
Admission: RE | Admit: 2021-09-13 | Discharge: 2021-09-13 | Disposition: A | Discharge status: Home / Self Care | Payer: Medicaid Other | Source: Ambulatory Visit | Attending: Neurology | Admitting: Neurology

## 2021-09-13 ENCOUNTER — Encounter: Payer: Self-pay | Admitting: Internal Medicine

## 2021-09-13 ENCOUNTER — Other Ambulatory Visit (HOSPITAL_COMMUNITY): Payer: Self-pay | Admitting: Neurology

## 2021-09-13 ENCOUNTER — Ambulatory Visit (INDEPENDENT_AMBULATORY_CARE_PROVIDER_SITE_OTHER): Payer: Medicaid Other | Admitting: Internal Medicine

## 2021-09-13 DIAGNOSIS — G4736 Sleep related hypoventilation in conditions classified elsewhere: Secondary | ICD-10-CM

## 2021-09-13 DIAGNOSIS — R0609 Other forms of dyspnea: Secondary | ICD-10-CM

## 2021-09-13 DIAGNOSIS — R053 Chronic cough: Secondary | ICD-10-CM | POA: Diagnosis not present

## 2021-09-13 DIAGNOSIS — J449 Chronic obstructive pulmonary disease, unspecified: Secondary | ICD-10-CM | POA: Diagnosis not present

## 2021-09-13 DIAGNOSIS — F17210 Nicotine dependence, cigarettes, uncomplicated: Secondary | ICD-10-CM

## 2021-09-13 DIAGNOSIS — J4489 Other specified chronic obstructive pulmonary disease: Secondary | ICD-10-CM

## 2021-09-13 NOTE — Assessment & Plan Note (Addendum)
Active smoking/MM Spirometry 07/27/2021  FEV1 1.4 (46%)  Ratio 0.63 and mildly concave   - 07/27/2021   Walked on RA  x  3  lap(s) =  approx 750  ft  @ avg/cane pace, stopped due to end of study with L Leg pain with lowest 02 sats  96%  -  07/27/2021  :  Allergy screen 0.3/   IgE 462    alpha one AT phenotype  MM  Level 158 - 07/27/2021 trial of symbicort 80 2bid since cough > doe    - 09/13/2021  After extensive coaching inhaler device,  effectiveness =   75% (short Ti) > changed to breztri 2bid as sob >> cough   DDX of  difficult airways management almost all start with A and  include Adherence, Ace Inhibitors, Acid Reflux, Active Sinus Disease, Alpha 1 Antitripsin deficiency, Anxiety masquerading as Airways dz,  ABPA,  Allergy(esp in young), Aspiration (esp in elderly), Adverse effects of meds,  Active smoking or vaping, A bunch of PE's (a small clot burden can't cause this syndrome unless there is already severe underlying pulm or vascular dz with poor reserve) plus two Bs  = Bronchiectasis and Beta blocker use..and one C= CHF  Adherence is always the initial "prime suspect" and is a multilayered concern that requires a "trust but verify" approach in every patient - starting with knowing how to use medications, especially inhalers, correctly, keeping up with refills and understanding the fundamental difference between maintenance and prns vs those medications only taken for a very short course and then stopped and not refilled.  - see hfa teaching above - return with all meds in hand using a trust but verify approach to confirm accurate Medication  Reconciliation The principal here is that until we are certain that the  patients are doing what we've asked, it makes no sense to ask them to do more.   Active smoking top of the rest of the list > see sep a/p  ? Acid (or non-acid) GERD > always difficult to exclude as up to 75% of pts in some series report no assoc GI/ Heartburn symptoms> rec max (24h)   acid suppression and diet restrictions/ reviewed and instructions given in writing.   ? Allergy/ pos screen noted but no reported response  to low dose ics or systemic streroids rules against > changed to breztri 2bid   ? Anxiety/depression  > usually at the bottom of this list of usual suspects but  may interfere with adherence and also interpretation of response or lack thereof to symptom management which can be quite subjective.   ? chf >  bnp 19.5 rules strongly against   F/u in 2 weeks, to ER in meantime if worsening with present rx

## 2021-09-13 NOTE — Assessment & Plan Note (Addendum)
As of 07/27/2021 2.5 lpm hs and no desats walking - referred to sleep medicine 07/27/2021 >>> not scheduled to see as of 09/12/2021 > referred again 09/13/2021   Advised not to drive until sleep medicine eval complete   In meantime rec Make sure you check your oxygen saturation  AT  your highest level of activity (not after you stop)   to be sure it stays over 90% and adjust  02 flow upward to maintain this level if needed but remember to turn it back to previous settings when you stop (to conserve your supply).          Each maintenance medication was reviewed in detail including emphasizing most importantly the difference between maintenance and prns and under what circumstances the prns are to be triggered using an action plan format where appropriate.  Total time for H and P, chart review, counseling, reviewing hfa/02 device(s) and generating customized AVS unique to this office visit / same day charting > 30 min for multiple  refractory respiratory  symptoms of uncertain etiology

## 2021-09-13 NOTE — Progress Notes (Signed)
Jerry Aguirre, male    DOB: 03-16-1965   MRN: [id number]   Brief patient profile:  5  yowm active smoker/MM  referred to pulmonary clinic 07/27/2021 by NP [name] in [place]  for breathing         History of Present Illness  07/27/2021  Pulmonary/ 1st office eval/[name]  Chief Complaint  Patient presents with   Consult    Sob with exertion x 2 yrs., cough-green occass.,o2 sats 64-70's occass.  Dyspnea:  walks with crutch due to knees / walking to chicken house and up the ramp the house  Push mower x 5 min / [employer] from one end of store to other  Cough: worse in am / sometimes green on abx day 7 keflex  Sleep: no cpap / 2.5 lpm hs x 2009 and hypersomnolent, seeing neuro for cpap but not able to work out sleep issues/cpap effectiveness so far SABA use: four times a day  plus symb Rec My office will be contacting you by phone for referral to sleep medicine in [place] and you should not drive until this problem is corrected > not seen as of 09/13/2021 and not on schedule to see  Plan A = Automatic = Always=    symbicort 80 Take 2 puffs first thing in am and then another 2 puffs about 12 hours later.  Work on inhaler technique: Plan B = Backup (to supplement plan A, not to replace it) Only use your albuterol inhaler as a rescue medication  Make sure you check your oxygen saturation  AT  your highest level of activity (not after you stop)   to be sure it stays over 90% Please remember to go to the lab   Allergy screen 0.3/   IgE 462    alpha one AT phenotype  MM  Level 158         09/13/2021  f/u ov/[place] office/[name] re: GOLD 3  maint on symbicort 80 c/o swelling all over Chief Complaint  Patient presents with   Follow-up    Has not had sleep apt scheduled- no one called.  Breathing has worsened since last ov.   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   Also denies any obvious fluctuation of symptoms with weather or environmental changes or other aggravating or alleviating factors except as outlined above   No unusual exposure hx or h/o childhood pna/ asthma or knowledge of premature birth.  Current Allergies, Complete Past Medical History, Past Surgical History, Family History, and Social History were reviewed in [employer] record.  ROS  The following are not active complaints unless bolded Hoarseness, sore throat, dysphagia, dental problems, itching, sneezing,  nasal congestion or discharge of excess mucus or purulent secretions, ear ache,   fever, chills, sweats, unintended  wt loss or wt gain, classically pleuritic or exertional cp,  orthopnea pnd or arm/hand swelling  or leg swelling, presyncope, palpitations, abdominal pain, anorexia, nausea, vomiting, diarrhea  or change in bowel habits or change in bladder habits, change in stools or change in urine, dysuria, hematuria,  rash, arthralgias, visual complaints, headache, numbness, weakness or ataxia or problems with walking or coordination,  change in mood or  memory.        Current Meds  Medication Sig   albuterol (PROVENTIL HFA;VENTOLIN HFA) 108 (90 Base) MCG/ACT inhaler Inhale 2 puffs into the lungs every 6 (six) hours as needed for wheezing or shortness of breath.   amLODipine (NORVASC) 10 MG tablet Take 10 mg by mouth at bedtime.   aspirin EC 81 MG EC tablet Take 1 tablet (81 mg total) by mouth  daily. Swallow whole.   atorvastatin (LIPITOR) 40 MG tablet Take 1 tablet (40 mg total) by mouth daily at 6 PM.   baclofen (LIORESAL) 10 MG tablet Take 10 mg by mouth 2 (two) times daily.   cetirizine (ZYRTEC) 10 MG tablet Take 10 mg by mouth daily.   docusate sodium (COLACE) 100 MG capsule Take 1 capsule (100 mg total) by mouth 2 (two) times daily.   furosemide (LASIX) 20 MG tablet Take 20 mg by mouth daily.   lactulose (CHRONULAC) 10 GM/15ML solution Take 10 g by mouth at bedtime.   meloxicam (MOBIC) 7.5 MG tablet Take 7.5 mg by mouth 2 (two) times daily.   metoprolol succinate (TOPROL-XL) 25 MG 24 hr tablet Take 1 tablet (25 mg total) by mouth at bedtime.   ondansetron (ZOFRAN) 4 MG tablet Take 4 mg by mouth 2 (two) times daily.   oxyCODONE-acetaminophen (PERCOCET) 10-325 MG tablet Take 1 tablet by mouth every 6 (six) hours as needed for pain.   pantoprazole (PROTONIX) 40 MG tablet Take 40 mg by mouth 2 (two) times daily.   pramipexole (MIRAPEX) 0.75 MG tablet Take 0.75-1.5 mg by mouth See admin instructions. Take 0.75 mg in the morning and 1.5 mg at night   pregabalin (LYRICA) 150 MG capsule Take 150 mg by mouth at bedtime.   pregabalin (LYRICA) 75 MG capsule Take 75 mg by mouth at bedtime.   SYMBICORT 80-4.5 MCG/ACT inhaler Inhale 2 puffs into the lungs 2 (two) times daily.   triamterene-hydrochlorothiazide (DYAZIDE) 37.5-25 MG capsule Take 1 capsule by mouth daily.   zolpidem (AMBIEN) 10 MG tablet Take 10 mg by mouth at bedtime as needed.                         Past Medical History:  Diagnosis Date   COPD (chronic obstructive pulmonary disease) (HCC)    GERD (gastroesophageal reflux disease)    Headache    Hyperlipidemia    Hypertension    Sleep apnea    pt says his "OSA was mild and they said he did not need a CPAP"      Objective:     Vital signs reviewed  09/13/2021  - Note at rest 02 sats  95% on RA   General appearance:    chronically ill appearing amb wm gruff   voice    HEENT :  Oropharynx  clear / full dentures   Nasal turbinates nl    NECK :  without JVD/Nodes/TM/ nl carotid upstrokes bilaterally   LUNGS: no acc muscle use,  Mod barrel  contour chest wall with bilateral  pan exp  distant  wheeze and  without cough on insp or exp maneuvers and mod  Hyperresonant  to  percussion bilaterally     CV:  RRR  no s3 or murmur or increase in P2, and 1-2+ pitting LE> UE  ABD:  soft and nontender with pos mid insp Hoover's  in the supine position. No bruits or organomegaly appreciated, bowel sounds nl  MS:   Ext warm without deformities or   obvious joint restrictions , calf tenderness, cyanosis or clubbing  SKIN: warm and dry without lesions    NEURO:  alert, approp, nl sensorium with  no motor or cerebellar deficits apparent.            I personally reviewed images and agree with radiology impression as follows:  CXR:   09/08/21  Stable radiographic appearance of the chest. No active cardiopulmonary process. My review:  no chf   Repeat pa and lat Cxr 09/13/2021 not ordered by this office:  no changes    Assessment

## 2021-09-13 NOTE — Patient Instructions (Addendum)
My office will be contacting you by phone for referral to Sleep Medicine  (do drive in meantime)  - if you don't hear back from my office within one week please call us back or notify us thru MyChart and we'll address it right away.   Protonix 40 mg Take 30- 60 min before your first and last meals of the day   GERD (REFLUX)  is an extremely common cause of respiratory symptoms just like yours , many times with no obvious heartburn at all.    It can be treated with medication, but also with lifestyle changes including elevation of the head of your bed (ideally with 6 -8inch blocks under the headboard of your bed),  Smoking cessation, avoidance of late meals, excessive alcohol, and avoid fatty foods, chocolate, peppermint, colas, red wine, and acidic juices such as orange juice.  NO MINT OR MENTHOL PRODUCTS SO NO COUGH DROPS  USE SUGARLESS CANDY INSTEAD (Jolley ranchers or Stover's or Life Savers) or even ice chips will also do - the key is to swallow to prevent all throat clearing. NO OIL BASED VITAMINS - use powdered substitutes.  Avoid fish oil when coughing.   For cough > mucinex dm 1200 mg every 12 hours as needed   Plan A = Automatic = Always=    Change symbicort  to Breztri Take 2 puffs first thing in am and then another 2 puffs about 12 hours later.     Plan B = Backup (to supplement plan A, not to replace it) Only use your albuterol inhaler as a rescue medication to be used if you can't catch your breath by resting or doing a relaxed purse lip breathing pattern.  - The less you use it, the better it will work when you need it. - Ok to use the inhaler up to 2 puffs  every 4 hours if you must but call for appointment if use goes up over your usual need - Don't leave home without it !!  (think of it like the spare tire for your car)   Make sure you check your oxygen saturation  AT  your highest level of activity (not after you stop)   to be sure it stays over 90% and adjust  02 flow upward  to maintain this level if needed but remember to turn it back to previous settings when you stop (to conserve your supply).   The key is to stop smoking completely before smoking completely stops you!  Please schedule a follow up office visit in 2 weeks, sooner if needed  with all medications /inhalers/ solutions in hand so we can verify exactly what you are taking. This includes all medications from all doctors and over the counters  - if getting worse on present plan go to ER   ADDENDUM:  instructions should say do NOT  drive in meantime/expedite referral to sleep medicine

## 2021-09-14 ENCOUNTER — Telehealth: Payer: Self-pay | Admitting: Internal Medicine

## 2021-09-14 LAB — BRAIN NATRIURETIC PEPTIDE: BNP: 19.5 pg/mL (ref 0.0–100.0)

## 2021-09-14 LAB — TSH: TSH: 1.41 u[IU]/mL (ref 0.450–4.500)

## 2021-09-14 NOTE — Assessment & Plan Note (Signed)
Counseled re importance of smoking cessation but did not meet time criteria for separate billing   °

## 2021-09-14 NOTE — Telephone Encounter (Signed)
Typo in AVS  ADDENDUM:  instructions should say do NOT  drive  Please expedite/assure  referral to sleep medicine

## 2021-09-14 NOTE — Telephone Encounter (Signed)
Called and left vm letting patient know of typo and asking patient to call back so we can schedule sleep consult appt for him

## 2021-09-16 ENCOUNTER — Encounter: Payer: Self-pay | Admitting: Internal Medicine

## 2021-09-22 ENCOUNTER — Ambulatory Visit (INDEPENDENT_AMBULATORY_CARE_PROVIDER_SITE_OTHER): Payer: Medicaid Other | Admitting: Internal Medicine

## 2021-09-22 ENCOUNTER — Encounter: Payer: Self-pay | Admitting: Internal Medicine

## 2021-09-22 VITALS — BP 128/68 | HR 81 | Ht 64.0 in | Wt 209.8 lb

## 2021-09-22 DIAGNOSIS — G8929 Other chronic pain: Secondary | ICD-10-CM

## 2021-09-22 DIAGNOSIS — Z2821 Immunization not carried out because of patient refusal: Secondary | ICD-10-CM

## 2021-09-22 DIAGNOSIS — R0609 Other forms of dyspnea: Secondary | ICD-10-CM

## 2021-09-22 DIAGNOSIS — I10 Essential (primary) hypertension: Secondary | ICD-10-CM | POA: Diagnosis not present

## 2021-09-22 DIAGNOSIS — R7303 Prediabetes: Secondary | ICD-10-CM | POA: Diagnosis not present

## 2021-09-22 DIAGNOSIS — J449 Chronic obstructive pulmonary disease, unspecified: Secondary | ICD-10-CM

## 2021-09-22 DIAGNOSIS — Z0000 Encounter for general adult medical examination without abnormal findings: Secondary | ICD-10-CM

## 2021-09-22 DIAGNOSIS — K219 Gastro-esophageal reflux disease without esophagitis: Secondary | ICD-10-CM

## 2021-09-22 DIAGNOSIS — G2581 Restless legs syndrome: Secondary | ICD-10-CM

## 2021-09-22 DIAGNOSIS — M7918 Myalgia, other site: Secondary | ICD-10-CM

## 2021-09-22 DIAGNOSIS — F17210 Nicotine dependence, cigarettes, uncomplicated: Secondary | ICD-10-CM

## 2021-09-22 DIAGNOSIS — E785 Hyperlipidemia, unspecified: Secondary | ICD-10-CM

## 2021-09-22 NOTE — Progress Notes (Unsigned)
New Patient Office Visit  Subjective    Patient ID: Jerry Aguirre, male    DOB: 07/22/1965  Age: 56 y.o. MRN: [id number]  CC:  Chief Complaint  Patient presents with   Establish Care   HPI Jerry Aguirre presents to establish care. Previously followed by a PCP in [place] and has also received care at [facility]. He is a 56 year old male with a past medical history significant for COPD, HTN, GERD, current tobacco use, lumbar stenosis, prediabetes, chronic MSK pain.  [name] acute concern today is bilateral lower extremity edema and progressive dyspnea on exertion.  He is concerned that this is related to congestive heart failure, however recent BNP was not elevated.  He also had an echocardiogram completed in July 2022, which showed normal EF and normal LV diastolic parameters.  Currently taking Lasix 20 mg twice daily for symptomatic relief.  He has establish care with pulmonology as well, Dr. Melvyn Novas, for evaluation.  Acute concerns and chronic medical issues discussed today are individually addressed A/P below.  Outpatient Encounter Medications as of 09/22/2021  Medication Sig   amLODipine (NORVASC) 10 MG tablet Take 10 mg by mouth at bedtime.   aspirin EC 81 MG EC tablet Take 1 tablet (81 mg total) by mouth daily. Swallow whole.   atorvastatin (LIPITOR) 40 MG tablet Take 1 tablet (40 mg total) by mouth daily at 6 PM.   baclofen (LIORESAL) 10 MG tablet Take 10 mg by mouth 2 (two) times daily.   cetirizine (ZYRTEC) 10 MG tablet Take 10 mg by mouth daily.   docusate sodium (COLACE) 100 MG capsule Take 1 capsule (100 mg total) by mouth 2 (two) times daily.   furosemide (LASIX) 20 MG tablet Take 20 mg by mouth daily.   lactulose (CHRONULAC) 10 GM/15ML solution Take 10 g by mouth at bedtime.   meloxicam (MOBIC) 7.5 MG tablet Take 7.5 mg by mouth 2 (two) times daily.   metoprolol succinate (TOPROL-XL) 25 MG 24 hr tablet Take 1 tablet (25 mg total) by mouth at bedtime.   ondansetron  (ZOFRAN) 4 MG tablet Take 4 mg by mouth 2 (two) times daily.   oxyCODONE-acetaminophen (PERCOCET) 10-325 MG tablet Take 1 tablet by mouth every 6 (six) hours as needed for pain.   pantoprazole (PROTONIX) 40 MG tablet Take 40 mg by mouth 2 (two) times daily.   pramipexole (MIRAPEX) 0.75 MG tablet Take 0.75-1.5 mg by mouth See admin instructions. Take 0.75 mg in the morning and 1.5 mg at night   pregabalin (LYRICA) 150 MG capsule Take 150 mg by mouth at bedtime.   triamterene-hydrochlorothiazide (DYAZIDE) 37.5-25 MG capsule Take 1 capsule by mouth daily.   zolpidem (AMBIEN) 10 MG tablet Take 10 mg by mouth at bedtime as needed.   albuterol (PROVENTIL HFA;VENTOLIN HFA) 108 (90 Base) MCG/ACT inhaler Inhale 2 puffs into the lungs every 6 (six) hours as needed for wheezing or shortness of breath.   [DISCONTINUED] pregabalin (LYRICA) 75 MG capsule Take 75 mg by mouth at bedtime.   No facility-administered encounter medications on file as of 09/22/2021.    Past Medical History:  Diagnosis Date   COPD (chronic obstructive pulmonary disease) (HCC)    GERD (gastroesophageal reflux disease)    Headache    Hyperlipidemia    Hypertension    Sleep apnea    pt says his "OSA was mild and they said he did not need a CPAP"    Past Surgical History:  Procedure Laterality Date  HERNIA REPAIR Right    inguinal   LUMBAR LAMINECTOMY/DECOMPRESSION MICRODISCECTOMY N/A 04/21/2021   Procedure: LAMINECTOMY, MEDIAL FACETECTOMY LUMBAR FOUR- FIVE;  Surgeon: Vallarie Mare, MD;  Location: [facility];  Service: Neurosurgery;  Laterality: N/A;   TONSILLECTOMY     removed as a child    History reviewed. No pertinent family history.  Social History   Socioeconomic History   Marital status: Single    Spouse name: Not on file   Number of children: 2   Years of education: Not on file   Highest education level: Not on file  Occupational History   Not on file  Tobacco Use   Smoking status: Every Day    Packs/day:  1.00    Types: Cigarettes   Smokeless tobacco: Never  Vaping Use   Vaping Use: Never used  Substance and Sexual Activity   Alcohol use: Not Currently   Drug use: Yes    Types: Marijuana    Comment: twice a week   Sexual activity: Not on file  Other Topics Concern   Not on file  Social History Narrative   Not on file   Social Determinants of Health   Financial Resource Strain: Not on file  Food Insecurity: Not on file  Transportation Needs: Not on file  Physical Activity: Not on file  Stress: Not on file  Social Connections: Not on file  Intimate Partner Violence: Not on file    Review of Systems  Constitutional:  Negative for chills and fever.  HENT:  Negative for sore throat.   Respiratory:  Positive for shortness of breath. Negative for cough.   Cardiovascular:  Positive for orthopnea and leg swelling. Negative for chest pain and palpitations.  Gastrointestinal:  Negative for abdominal pain, blood in stool, constipation, diarrhea, nausea and vomiting.  Genitourinary:  Negative for dysuria and hematuria.  Musculoskeletal:  Negative for myalgias.  Skin:  Negative for itching and rash.  Neurological:  Negative for dizziness and headaches.  Psychiatric/Behavioral:  Negative for depression and suicidal ideas.     Objective    BP 128/68   Pulse 81   Ht '5\' 4"'  (1.626 m)   Wt 209 lb 12.8 oz (95.2 kg)   SpO2 92%   BMI 36.01 kg/m   Physical Exam Vitals reviewed.  Constitutional:      Appearance: He is obese.     Comments: Appears older than stated age  HENT:     Head: Normocephalic and atraumatic.     Right Ear: External ear normal.     Left Ear: External ear normal.     Nose: Nose normal. No congestion or rhinorrhea.     Mouth/Throat:     Mouth: Mucous membranes are moist.     Pharynx: Oropharynx is clear. No oropharyngeal exudate or posterior oropharyngeal erythema.  Eyes:     General: No scleral icterus.    Conjunctiva/sclera: Conjunctivae normal.      Pupils: Pupils are equal, round, and reactive to light.  Cardiovascular:     Rate and Rhythm: Normal rate and regular rhythm.     Pulses: Normal pulses.     Heart sounds: Murmur heard.  Pulmonary:     Effort: Pulmonary effort is normal.     Comments: Coarse bilaterally Abdominal:     General: Abdomen is flat. Bowel sounds are normal. There is no distension.     Palpations: Abdomen is soft.     Tenderness: There is no abdominal tenderness.  Musculoskeletal:  General: Swelling present.     Right lower leg: Edema present.     Left lower leg: Edema present.  Lymphadenopathy:     Cervical: No cervical adenopathy.  Skin:    General: Skin is warm and dry.     Capillary Refill: Capillary refill takes less than 2 seconds.     Coloration: Skin is not jaundiced.  Neurological:     General: No focal deficit present.     Mental Status: He is alert and oriented to person, place, and time.     Motor: No weakness.  Psychiatric:        Mood and Affect: Mood normal.        Behavior: Behavior normal.     Last CBC Lab Results  Component Value Date   WBC 8.5 09/22/2021   HGB 14.7 09/22/2021   HCT 44.1 09/22/2021   MCV 90 09/22/2021   MCH 30.1 09/22/2021   RDW 13.6 09/22/2021   PLT 274 04/54/0981   Last metabolic panel Lab Results  Component Value Date   GLUCOSE 95 09/22/2021   NA 143 09/22/2021   K 4.1 09/22/2021   CL 102 09/22/2021   CO2 25 09/22/2021   BUN 15 09/22/2021   CREATININE 1.13 09/22/2021   EGFR 76 09/22/2021   CALCIUM 9.9 09/22/2021   PROT 7.1 09/22/2021   ALBUMIN 4.6 09/22/2021   LABGLOB 2.5 09/22/2021   AGRATIO 1.8 09/22/2021   BILITOT 0.7 09/22/2021   ALKPHOS 98 09/22/2021   AST 21 09/22/2021   ALT 27 09/22/2021   ANIONGAP 13 09/08/2021   Last lipids Lab Results  Component Value Date   CHOL 199 09/22/2021   HDL 42 09/22/2021   LDLCALC 130 (H) 09/22/2021   TRIG 153 (H) 09/22/2021   CHOLHDL 4.7 09/22/2021   Last hemoglobin A1c Lab Results   Component Value Date   HGBA1C 6.2 (H) 09/22/2021   Last thyroid functions Lab Results  Component Value Date   TSH 1.410 09/13/2021   Last vitamin D Lab Results  Component Value Date   VD25OH 25.4 (L) 09/22/2021   Last vitamin B12 and Folate Lab Results  Component Value Date   VITAMINB12 272 09/22/2021   FOLATE >20.0 09/22/2021        Assessment & Plan:   Problem List Items Addressed This Visit       Cardiovascular and Mediastinum   Essential hypertension    BP 128/68 today.  Well-controlled on current regimen of amlodipine 10 mg daily, Toprol-XL 25 mg daily, and Dyazide 37.5-25 mg daily. -No changes today.  Continue current regimen -Updating labs        Respiratory   COPD  GOLD 3    Followed by pulmonology, Dr. Melvyn Novas, last seen 8/29.  He has been switched to [facility] and will follow-up with Dr. Melvyn Novas on 9/12. -Counseled on the importance of smoking cessation        Digestive   GERD (gastroesophageal reflux disease)    Symptoms controlled with Protonix 40 mg twice daily        Other   Dyslipidemia    Currently prescribed atorvastatin 40 mg daily. -Repeat lipid panel today      Prediabetes    Repeat A1c today      Cigarette smoker    Current, everyday smoker with an extensive history of tobacco use.  At one point he was smoking 3 packs of cigarettes daily.  Now states he is smoking less than 1 pack/day.  Remains precontemplative with  regards to cessation. -Smoking cessation instruction/counseling given:  counseled patient on the dangers of tobacco use, advised patient to stop smoking, and reviewed strategies to maximize success      DOE (dyspnea on exertion)    Exact etiology remains unclear.  He has been referred  to sleep medicine for a sleep study.  Echo from July 2022 and BNP from last month are not suggestive of heart failure.  He is currently taking Lasix 20 mg twice daily for symptom relief. -Additional work-up pending -I recommended that he try  increasing Lasix to 40 mg in the morning to see if this provides symptom relief      Chronic musculoskeletal pain    He is establish care with pain management and receives monthly prescriptions of Percocet, Lyrica, and Ambien.  PDMP reviewed today.      RLS (restless legs syndrome)    Symptoms controlled with pramipexole -No changes today -Checking B12/folate levels      Preventative health care    Presenting today to establish care. -Baseline labs ordered today, including one-time HCV screening -Referred for colonoscopy today -Declined all outstanding vaccines -Believes that he has had Tdap within the last 10 years.  Declined today as we await outstanding records      Return in about 2 weeks (around 10/06/2021).   Johnette Abraham, MD

## 2021-09-22 NOTE — Patient Instructions (Signed)
It was a pleasure to see you today.  Thank you for giving [name] the opportunity to be involved in your care.  Below is a brief recap of your visit and next steps.  We will plan to see you again in 2-4 weeks.  Summary We are checking basic labs today I have referred you for a colonoscopy We will work on getting records from [place] and the health department  Next steps Follow up in 2 weeks I will notify you of lab results You can increase lasix to 40 mg in the morning to see if this improves your swelling

## 2021-09-23 ENCOUNTER — Encounter (INDEPENDENT_AMBULATORY_CARE_PROVIDER_SITE_OTHER): Payer: Self-pay | Admitting: *Deleted

## 2021-09-24 LAB — CBC WITH DIFFERENTIAL/PLATELET
Basophils Absolute: 0.1 10*3/uL (ref 0.0–0.2)
Basos: 1 %
EOS (ABSOLUTE): 0.1 10*3/uL (ref 0.0–0.4)
Eos: 2 %
Hematocrit: 44.1 % (ref 37.5–51.0)
Hemoglobin: 14.7 g/dL (ref 13.0–17.7)
Immature Grans (Abs): 0 10*3/uL (ref 0.0–0.1)
Immature Granulocytes: 0 %
Lymphocytes Absolute: 1.4 10*3/uL (ref 0.7–3.1)
Lymphs: 16 %
MCH: 30.1 pg (ref 26.6–33.0)
MCHC: 33.3 g/dL (ref 31.5–35.7)
MCV: 90 fL (ref 79–97)
Monocytes Absolute: 0.6 10*3/uL (ref 0.1–0.9)
Monocytes: 7 %
Neutrophils Absolute: 6.2 10*3/uL (ref 1.4–7.0)
Neutrophils: 74 %
Platelets: 274 10*3/uL (ref 150–450)
RBC: 4.89 x10E6/uL (ref 4.14–5.80)
RDW: 13.6 % (ref 11.6–15.4)
WBC: 8.5 10*3/uL (ref 3.4–10.8)

## 2021-09-24 LAB — CMP14+EGFR
ALT: 27 IU/L (ref 0–44)
AST: 21 IU/L (ref 0–40)
Albumin/Globulin Ratio: 1.8 (ref 1.2–2.2)
Albumin: 4.6 g/dL (ref 3.8–4.9)
Alkaline Phosphatase: 98 IU/L (ref 44–121)
BUN/Creatinine Ratio: 13 (ref 9–20)
BUN: 15 mg/dL (ref 6–24)
Bilirubin Total: 0.7 mg/dL (ref 0.0–1.2)
CO2: 25 mmol/L (ref 20–29)
Calcium: 9.9 mg/dL (ref 8.7–10.2)
Chloride: 102 mmol/L (ref 96–106)
Creatinine, Ser: 1.13 mg/dL (ref 0.76–1.27)
Globulin, Total: 2.5 g/dL (ref 1.5–4.5)
Glucose: 95 mg/dL (ref 70–99)
Potassium: 4.1 mmol/L (ref 3.5–5.2)
Sodium: 143 mmol/L (ref 134–144)
Total Protein: 7.1 g/dL (ref 6.0–8.5)
eGFR: 76 mL/min/{1.73_m2} (ref >59)

## 2021-09-24 LAB — LIPID PANEL
Chol/HDL Ratio: 4.7 ratio (ref 0.0–5.0)
Cholesterol, Total: 199 mg/dL (ref 100–199)
HDL: 42 mg/dL (ref >39)
LDL Chol Calc (NIH): 130 mg/dL — ABNORMAL HIGH (ref 0–99)
Triglycerides: 153 mg/dL — ABNORMAL HIGH (ref 0–149)
VLDL Cholesterol Cal: 27 mg/dL (ref 5–40)

## 2021-09-24 LAB — HCV INTERPRETATION

## 2021-09-24 LAB — HCV AB W REFLEX TO QUANT PCR: HCV Ab: NONREACTIVE

## 2021-09-24 LAB — VITAMIN D 25 HYDROXY (VIT D DEFICIENCY, FRACTURES): Vit D, 25-Hydroxy: 25.4 ng/mL — ABNORMAL LOW (ref 30.0–100.0)

## 2021-09-24 LAB — B12 AND FOLATE PANEL
Folate: >20 ng/mL (ref >3.0)
Vitamin B-12: 272 pg/mL (ref 232–1245)

## 2021-09-24 LAB — HEMOGLOBIN A1C
Est. average glucose Bld gHb Est-mCnc: 131 mg/dL
Hgb A1c MFr Bld: 6.2 % — ABNORMAL HIGH (ref 4.8–5.6)

## 2021-09-26 ENCOUNTER — Other Ambulatory Visit (HOSPITAL_COMMUNITY): Payer: Self-pay

## 2021-09-26 ENCOUNTER — Other Ambulatory Visit: Payer: Self-pay | Admitting: Internal Medicine

## 2021-09-26 DIAGNOSIS — G8929 Other chronic pain: Secondary | ICD-10-CM | POA: Insufficient documentation

## 2021-09-26 DIAGNOSIS — G2581 Restless legs syndrome: Secondary | ICD-10-CM | POA: Insufficient documentation

## 2021-09-26 DIAGNOSIS — Z0000 Encounter for general adult medical examination without abnormal findings: Secondary | ICD-10-CM | POA: Insufficient documentation

## 2021-09-26 MED ORDER — BACLOFEN 10 MG PO TABS
10.0000 mg | ORAL_TABLET | Freq: Two times a day (BID) | ORAL | 2 refills | Status: DC
  Filled 2021-09-26: qty 60, 30d supply, fill #0

## 2021-09-26 NOTE — Assessment & Plan Note (Signed)
Presenting today to establish care. -Baseline labs ordered today, including one-time HCV screening -Referred for colonoscopy today -Declined all outstanding vaccines -Believes that he has had Tdap within the last 10 years.  Declined today as we await outstanding records

## 2021-09-26 NOTE — Assessment & Plan Note (Signed)
Currently prescribed atorvastatin 40 mg daily.  Repeat lipid panel today. 

## 2021-09-26 NOTE — Assessment & Plan Note (Signed)
Symptoms controlled with pramipexole -No changes today -Checking B12/folate levels

## 2021-09-26 NOTE — Assessment & Plan Note (Signed)
Followed by pulmonology, Dr. Melvyn Novas, last seen 8/29.  He has been switched to [facility] and will follow-up with Dr. Melvyn Novas on 9/12. -Counseled on the importance of smoking cessation

## 2021-09-26 NOTE — Assessment & Plan Note (Signed)
Current, everyday smoker with an extensive history of tobacco use.  At one point he was smoking 3 packs of cigarettes daily.  Now states he is smoking less than 1 pack/day.  Remains precontemplative with regards to cessation. -Smoking cessation instruction/counseling given:  counseled patient on the dangers of tobacco use, advised patient to stop smoking, and reviewed strategies to maximize success

## 2021-09-26 NOTE — Assessment & Plan Note (Signed)
Symptoms controlled with Protonix 40 mg twice daily

## 2021-09-26 NOTE — Assessment & Plan Note (Signed)
BP 128/68 today.  Well-controlled on current regimen of amlodipine 10 mg daily, Toprol-XL 25 mg daily, and Dyazide 37.5-25 mg daily. -No changes today.  Continue current regimen -Updating labs

## 2021-09-26 NOTE — Assessment & Plan Note (Signed)
Repeat A1c today

## 2021-09-26 NOTE — Assessment & Plan Note (Addendum)
Exact etiology remains unclear.  He has been referred  to sleep medicine for a sleep study.  Echo from July 2022 and BNP from last month are not suggestive of heart failure.  He is currently taking Lasix 20 mg twice daily for symptom relief. -Additional work-up pending -I recommended that he try increasing Lasix to 40 mg in the morning to see if this provides symptom relief

## 2021-09-26 NOTE — Assessment & Plan Note (Signed)
He is establish care with pain management and receives monthly prescriptions of Percocet, Lyrica, and Ambien.  PDMP reviewed today.

## 2021-09-27 ENCOUNTER — Encounter: Payer: Self-pay | Admitting: Internal Medicine

## 2021-09-27 ENCOUNTER — Ambulatory Visit (INDEPENDENT_AMBULATORY_CARE_PROVIDER_SITE_OTHER): Payer: Medicaid Other | Admitting: Internal Medicine

## 2021-09-27 ENCOUNTER — Other Ambulatory Visit (HOSPITAL_COMMUNITY): Payer: Self-pay

## 2021-09-27 ENCOUNTER — Telehealth: Payer: Self-pay

## 2021-09-27 DIAGNOSIS — J449 Chronic obstructive pulmonary disease, unspecified: Secondary | ICD-10-CM

## 2021-09-27 DIAGNOSIS — F17210 Nicotine dependence, cigarettes, uncomplicated: Secondary | ICD-10-CM

## 2021-09-27 DIAGNOSIS — G4736 Sleep related hypoventilation in conditions classified elsewhere: Secondary | ICD-10-CM

## 2021-09-27 MED ORDER — BREZTRI AEROSPHERE 160-9-4.8 MCG/ACT IN AERO: INHALATION_SPRAY | RESPIRATORY_TRACT | 11 refills | Status: DC

## 2021-09-27 NOTE — Telephone Encounter (Signed)
Patient Advocate Encounter   Received notification that prior authorization is required for [facility] [employer] 160-9-4.8MCG/ACT aerosol  Submitted: 09-27-2021 Key [id number]  Prior authorization has been DENIED.   Determination letter will be sent to the patient.

## 2021-09-27 NOTE — Progress Notes (Unsigned)
Jerry Aguirre, male    DOB: [birth year]   MRN: [id number]   Brief patient profile:  73  yowm active smoker/MM  referred to pulmonary clinic 07/27/2021 by NP [name] in [place]  for breathing         History of Present Illness  07/27/2021  Pulmonary/ 1st office eval/Jerry Aguirre  Chief Complaint  Patient presents with   Consult    Sob with exertion x 2 yrs., cough-green occass.,o2 sats 64-70's occass.  Dyspnea:  walks with crutch due to knees / walking to chicken house and up the ramp the house  Push mower x 5 min / [employer] from one end of store to other  Cough: worse in am / sometimes green on abx day 7 keflex  Sleep: no cpap / 2.5 lpm hs x 2009 and hypersomnolent, seeing neuro for cpap but not able to work out sleep issues/cpap effectiveness so far SABA use: four times a day  plus symb Rec My office will be contacting you by phone for referral to sleep medicine in [place] and you should not drive until this problem is corrected > not seen as of 09/13/2021 and not on schedule to see  Plan A = Automatic = Always=    symbicort 80 Take 2 puffs first thing in am and then another 2 puffs about 12 hours later.  Work on inhaler technique: Plan B = Backup (to supplement plan A, not to replace it) Only use your albuterol inhaler as a rescue medication  Make sure you check your oxygen saturation  AT  your highest level of activity (not after you stop)   to be sure it stays over 90% Please remember to go to the lab   Allergy screen 0.3/   IgE 462    alpha one AT phenotype  MM  Level 158         09/13/2021  f/u ov/Jerry Aguirre office/Jerry Aguirre re: GOLD 3  maint on symbicort 80 c/o swelling all over Chief Complaint  Patient presents with   Follow-up    Has not had sleep apt scheduled- no one called.  Breathing has worsened since last ov.   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 but did not check sats as rec  Cough: very light green, worse in am / no better p prednisone or abx  Sleeping: very  poorly in lift chair / only sleeps 2 h then awake rest of night/ was told by neuro he does not have osa but pt wants second opinion  SABA use: 3 x daily helps some  02: 2.5 lpm at hs and prn daytime but not titrating to sats  Covid status: none, flatly declines to consider  Lung cancer screening: eligible, rec  Rec My office will be contacting you by phone for referral to Sleep Medicine  (do drive in meantime)  - if you don't hear back from my office within one week please call us back or notify us thru MyChart and we'll address it right away.  Protonix 40 mg Take 30- 60 min before your first and last meals of the day  GERD diet  For cough > mucinex dm 1200 mg every 12 hours as needed  Plan A = Automatic = Always=    Change symbicort  to Breztri Take 2 puffs first thing in am and then another 2 puffs about 12 hours later.   Plan B = Backup (to supplement plan A, not to replace it) Only use your albuterol inhaler as  a rescue medication  Make sure you check your oxygen saturation  AT  your highest level of activity (not after you stop)  The key is to stop smoking completely before smoking completely stops you! Please schedule a follow up office visit in 2 weeks, sooner if needed  with all medications /inhalers/ solutions in hand so we can verify exactly what you are taking. This includes all medications from all doctors and over the counters  - if getting worse on present plan go to ER  ADDENDUM:  instructions should say do NOT  drive in meantime/expedite referral to sleep medicine    09/27/2021  f/u ov/Jerry Aguirre office/Jerry Aguirre re: GOLD 3 copd/ smoking  maint on breztri   Chief Complaint  Patient presents with   Follow-up    He is doing good during the day and he is worse at night, he is having some congestion.    Dyspnea:  better walking to chickens/ still smoking  Cough: some nasal congestion  Sleeping: in a lift  chair feels suffocating wears off p 5 min  SABA use: using way to much, not  taking ppi as rec  02: 2.5 lpm  Covid status: refuses  Lung cancer screening: done    No obvious day to day or daytime variability or assoc excess/ purulent sputum or mucus plugs or hemoptysis or cp or chest tightness, subjective wheeze or overt sinus or hb symptoms.    Also denies any obvious fluctuation of symptoms with weather or environmental changes or other aggravating or alleviating factors except as outlined above   No unusual exposure hx or h/o childhood pna/ asthma or knowledge of premature birth.  Current Allergies, Complete Past Medical History, Past Surgical History, Family History, and Social History were reviewed in [employer] record.  ROS  The following are not active complaints unless bolded Hoarseness, sore throat, dysphagia, dental problems, itching, sneezing,  nasal congestion or discharge of excess mucus or purulent secretions, ear ache,   fever, chills, sweats, unintended wt loss or wt gain, classically pleuritic or exertional cp,  orthopnea pnd or arm/hand swelling  or leg swelling, presyncope, palpitations, abdominal pain, anorexia, nausea, vomiting, diarrhea  or change in bowel habits or change in bladder habits, change in stools or change in urine, dysuria, hematuria,  rash, arthralgias, visual complaints, headache, numbness, weakness or ataxia or problems with walking or coordination,  change in mood or  memory.        Current Meds  Medication Sig   albuterol (PROVENTIL HFA;VENTOLIN HFA) 108 (90 Base) MCG/ACT inhaler Inhale 2 puffs into the lungs every 6 (six) hours as needed for wheezing or shortness of breath.   amLODipine (NORVASC) 10 MG tablet Take 10 mg by mouth at bedtime.   aspirin EC 81 MG EC tablet Take 1 tablet (81 mg total) by mouth daily. Swallow whole.   atorvastatin (LIPITOR) 40 MG tablet Take 1 tablet (40 mg total) by mouth daily at 6 PM.   baclofen (LIORESAL) 10 MG tablet Take 1 tablet (10 mg total) by mouth 2 (two) times  daily.   cetirizine (ZYRTEC) 10 MG tablet Take 10 mg by mouth daily.   docusate sodium (COLACE) 100 MG capsule Take 1 capsule (100 mg total) by mouth 2 (two) times daily.   furosemide (LASIX) 20 MG tablet Take 20 mg by mouth daily.   lactulose (CHRONULAC) 10 GM/15ML solution Take 10 g by mouth at bedtime.   meloxicam (MOBIC) 7.5 MG tablet Take 7.5 mg by  mouth 2 (two) times daily.   metoprolol succinate (TOPROL-XL) 25 MG 24 hr tablet Take 1 tablet (25 mg total) by mouth at bedtime.   ondansetron (ZOFRAN) 4 MG tablet Take 4 mg by mouth 2 (two) times daily.   oxyCODONE-acetaminophen (PERCOCET) 10-325 MG tablet Take 1 tablet by mouth every 6 (six) hours as needed for pain.   pantoprazole (PROTONIX) 40 MG tablet Take 40 mg by mouth 2 (two) times daily.   pramipexole (MIRAPEX) 0.75 MG tablet Take 0.75-1.5 mg by mouth See admin instructions. Take 0.75 mg in the morning and 1.5 mg at night   pregabalin (LYRICA) 150 MG capsule Take 150 mg by mouth at bedtime.   triamterene-hydrochlorothiazide (DYAZIDE) 37.5-25 MG capsule Take 1 capsule by mouth daily.   zolpidem (AMBIEN) 10 MG tablet Take 10 mg by mouth at bedtime as needed.                        Past Medical History:  Diagnosis Date   COPD (chronic obstructive pulmonary disease) (HCC)    GERD (gastroesophageal reflux disease)    Headache    Hyperlipidemia    Hypertension    Sleep apnea    pt says his "OSA was mild and they said he did not need a CPAP"      Objective:     Wt Readings from Last 3 Encounters:  09/27/21 203 lb 12.8 oz (92.4 kg)  09/22/21 209 lb 12.8 oz (95.2 kg)  09/13/21 211 lb 9.6 oz (96 kg)      Vital signs reviewed  09/27/2021  - Note at rest 02 sats  ***% on ***   General appearance:    amb hoarse wm/walks with cane        Mod barr 1-2+ pitting LE> UE***                Assessment

## 2021-09-27 NOTE — Patient Instructions (Signed)
My office will be contacting you by phone for referral to lung cancer screening   - if you don't hear back from my office within one week please call us back or notify us thru MyChart and we'll address it right away.   Plan A = Automatic = Always=    Breztri Take 2 puffs first thing in am and then another 2 puffs about 12 hours later.    Work on inhaler technique:  relax and gently blow all the way out then take a nice smooth full deep breath back in, triggering the inhaler at same time you start breathing in.  Hold breath in for at least  5 seconds if you can. Blow out breztri  thru nose. Rinse and gargle with water when done.  If mouth or throat bother you at all,  try brushing teeth/gums/tongue with arm and hammer toothpaste/ make a slurry and gargle and spit out.      Plan B = Backup (to supplement plan A, not to replace it) Only use your albuterol inhaler as a rescue medication to be used if you can't catch your breath by resting or doing a relaxed purse lip breathing pattern.  - The less you use it, the better it will work when you need it. - Ok to use the inhaler up to 2 puffs  every 4 hours if you must but call for appointment if use goes up over your usual need - Don't leave home without it !!  (think of it like the spare tire for your car)   Ok to try albuterol 15 min before an activity (on alternating days)  that you know would usually make you short of breath and see if it makes any difference and if makes none then don't take albuterol after activity unless you can't catch your breath as this means it's the resting that helps, not the albuterol.     Protonix (pantoprazole) is  Take 30- 60 min before your first and last meals of the day   Please schedule a follow up office visit in 6 weeks, call sooner if needed

## 2021-09-28 ENCOUNTER — Telehealth: Payer: Self-pay | Admitting: Internal Medicine

## 2021-09-28 ENCOUNTER — Encounter: Payer: Self-pay | Admitting: Internal Medicine

## 2021-09-28 ENCOUNTER — Other Ambulatory Visit: Payer: Self-pay

## 2021-09-28 MED ORDER — METOPROLOL SUCCINATE ER 25 MG PO TB24: 25.0000 mg | ORAL_TABLET | Freq: Every day | ORAL | 0 refills | Status: DC

## 2021-09-28 MED ORDER — ATORVASTATIN CALCIUM 40 MG PO TABS: 40.0000 mg | ORAL_TABLET | Freq: Every day | ORAL | 0 refills | Status: DC

## 2021-09-28 MED ORDER — CETIRIZINE HCL 10 MG PO TABS: 10.0000 mg | ORAL_TABLET | Freq: Every day | ORAL | 0 refills | Status: DC

## 2021-09-28 NOTE — Telephone Encounter (Signed)
Refills sent

## 2021-09-28 NOTE — Assessment & Plan Note (Signed)
As of 07/27/2021 2.5 lpm hs and no desats walking - referred to sleep medicine 07/27/2021 >>> not scheduled to see as of 09/12/2021 > referred again 09/13/2021   Some of his noct "spells" may be related to gerd > reviewed approp rx and f/u with sleep medicine as planned

## 2021-09-28 NOTE — Telephone Encounter (Signed)
Patient needs refills on    cetirizine (ZYRTEC) 10 MG tablet  metoprolol succinate (TOPROL-XL) 25 MG 24 hr tablet    atorvastatin (LIPITOR) 40 MG tablet    [employer]

## 2021-09-28 NOTE — Assessment & Plan Note (Signed)
Counseled re importance of smoking cessation but did not meet time criteria for separate billing    Low-dose CT lung cancer screening is recommended for patients who are 66-57 years of age with a 20+ pack-year history of smoking and who are currently smoking or quit <=15 years ago. No coughing up blood  No unintentional weight loss of > 15 pounds in the last 6 months - pt is eligible for scanning yearly until 39 y after quits   Discussed in detail all the  indications, usual  risks and alternatives  relative to the benefits with patient who agrees to proceed with w/u as outlined.             Each maintenance medication was reviewed in detail including emphasizing most importantly the difference between maintenance and prns and under what circumstances the prns are to be triggered using an action plan format where appropriate.  Total time for H and P, chart review, counseling, reviewing hfa/02 device(s) and generating customized AVS unique to this office visit / same day charting =36 min

## 2021-09-28 NOTE — Assessment & Plan Note (Signed)
Active smoking/MM Spirometry 07/27/2021  FEV1 1.4 (46%)  Ratio 0.63 and mildly concave   - 07/27/2021   Walked on RA  x  3  lap(s) =  approx 750  ft  @ avg/cane pace, stopped due to end of study with L Leg pain with lowest 02 sats  96%  -  07/27/2021  :  Allergy screen 0.3/   IgE 462    alpha one AT phenotype  MM  Level 158 - 07/27/2021 trial of symbicort 80 2bid since cough > doe    - 09/13/2021  After extensive coaching inhaler device,  effectiveness =   75% (short Ti) > changed to breztri 2bid as sob >> cough   09/27/2021  After extensive coaching inhaler device,  effectiveness =    80% from a baseline of 50%    Group D (now reclassified as E) in terms of symptom/risk and laba/lama/ICS  therefore appropriate rx at this point >>>  breztri and more approp saba use  Re SABA :  I spent extra time with pt today reviewing appropriate use of albuterol for prn use on exertion with the following points: 1) saba is for relief of sob that does not improve by walking a slower pace or resting but rather if the pt does not improve after trying this first. 2) If the pt is convinced, as many are, that saba helps recover from activity faster then it's easy to tell if this is the case by re-challenging : ie stop, take the inhaler, then p 5 minutes try the exact same activity (intensity of workload) that just caused the symptoms and see if they are substantially diminished or not after saba 3) if there is an activity that reproducibly causes the symptoms, try the saba 15 min before the activity on alternate days   If in fact the saba really does help, then fine to continue to use it prn but advised may need to look closer at the maintenance regimen being used to achieve better control of airways disease with exertion.

## 2021-10-03 NOTE — Telephone Encounter (Signed)
Change to duelra 200 Take 2 puffs first thing in am and then another 2 puffs about 12 hours later.   And spiriva respimat 2.5 mcg x 2 puffs each am in place of breztri but will need samples of breztri until we can get him back in to teach technique with the new device  - ok to write the new rx's to replace breztri but make sure he doesn't use both

## 2021-10-03 NOTE — Telephone Encounter (Signed)
Atc patient. Vm full

## 2021-10-04 ENCOUNTER — Other Ambulatory Visit (HOSPITAL_COMMUNITY): Payer: Self-pay

## 2021-10-04 NOTE — Telephone Encounter (Signed)
Called and spoke to patients sister Jerry Aguirre (ok per dpr) she is going to come pick up breztri samples from the [facility] office to hold patient over until appt on 11/08/21 before we send an order. Nothing further needed

## 2021-10-06 ENCOUNTER — Encounter: Payer: Self-pay | Admitting: Internal Medicine

## 2021-10-06 ENCOUNTER — Ambulatory Visit (INDEPENDENT_AMBULATORY_CARE_PROVIDER_SITE_OTHER): Payer: Medicaid Other | Admitting: Internal Medicine

## 2021-10-06 VITALS — BP 118/78 | HR 82 | Ht 64.0 in | Wt 205.4 lb

## 2021-10-06 DIAGNOSIS — R7303 Prediabetes: Secondary | ICD-10-CM

## 2021-10-06 DIAGNOSIS — R109 Unspecified abdominal pain: Secondary | ICD-10-CM | POA: Insufficient documentation

## 2021-10-06 DIAGNOSIS — E785 Hyperlipidemia, unspecified: Secondary | ICD-10-CM

## 2021-10-06 DIAGNOSIS — E559 Vitamin D deficiency, unspecified: Secondary | ICD-10-CM | POA: Diagnosis not present

## 2021-10-06 DIAGNOSIS — Z6835 Body mass index (BMI) 35.0-35.9, adult: Secondary | ICD-10-CM

## 2021-10-06 DIAGNOSIS — Z0000 Encounter for general adult medical examination without abnormal findings: Secondary | ICD-10-CM

## 2021-10-06 DIAGNOSIS — E669 Obesity, unspecified: Secondary | ICD-10-CM | POA: Insufficient documentation

## 2021-10-06 DIAGNOSIS — R0981 Nasal congestion: Secondary | ICD-10-CM

## 2021-10-06 MED ORDER — EZETIMIBE 10 MG PO TABS: 10.0000 mg | ORAL_TABLET | Freq: Every day | ORAL | 3 refills | Status: DC

## 2021-10-06 MED ORDER — OZEMPIC (0.25 OR 0.5 MG/DOSE) 2 MG/3ML ~~LOC~~ SOPN: 0.2500 mg | PEN_INJECTOR | SUBCUTANEOUS | 0 refills | Status: AC

## 2021-10-06 NOTE — Assessment & Plan Note (Signed)
-  States that he is scheduled for colonoscopy next month -Awaiting outside records to update vaccinations

## 2021-10-06 NOTE — Assessment & Plan Note (Addendum)
HbA1c 6.2.  He is not currently on any medications for prediabetes.  [name] expresses an interest in losing weight.  His BMI is 35. -Through shared decision making, [name] has agreed to start GLP-1 agonist therapy.  I have prescribed Ozempic today. -Plan for follow-up in 4 weeks.

## 2021-10-06 NOTE — Assessment & Plan Note (Signed)
Total vitamin D level 25.4 earlier this month. -Daily vitamin D supplementation, 2000 units, recommended

## 2021-10-06 NOTE — Assessment & Plan Note (Signed)
Mr. Schlitt is aware that he needs to lose weight. -Through shared decision-making, he is in agreement with starting GLP-1 agonist therapy today.  Ozempic 0.25 milligrams weekly injection x4 weeks has been prescribed.  We will plan for follow-up in 4 weeks for reassessment.  Plan increase to 0.5 mg weekly injections at that time.

## 2021-10-06 NOTE — Assessment & Plan Note (Signed)
There is tenderness over the right lumbar spine that radiates around his right side today.  He denies associated symptoms of dysuria and materia.  He states that he has had approximately 25 kidney stones in the past and that his current pain does not feel like pain he has previously experienced during a kidney stone.  He believes he is strained a muscle. -Continue conservative measures for now -I recommended that he return to care if symptoms not improve within 1 week

## 2021-10-06 NOTE — Patient Instructions (Addendum)
It was a pleasure to see you today.  Thank you for giving [name] the opportunity to be involved in your care.  Below is a brief recap of your visit and next steps.  We will plan to see you again in 4 weeks  Summary I have prescribed Ozempic today for weight loss and prediabetes I have prescribed ezetimibe for your cholesterol  I recommend you purchase fluticasone nasal spray, saline nasal rinse, and cetirizine over the counter for treatment of congestion. You could also try sudafed if these medications do not help.  Next steps Follow up in 4 weeks Please let me know if there are issues with getting Ozempic

## 2021-10-06 NOTE — Progress Notes (Signed)
Established Patient Office Visit  Subjective   Patient ID: Jerry Aguirre, male    DOB: 03/30/1965  Age: 56 y.o. MRN: [id number]  Chief Complaint  Patient presents with   Follow-up    2 week   Jerry Aguirre is a 56 year old gentleman presenting today to establish care.  He has a past medical history significant for COPD, HTN, GERD, current tobacco use, lumbar stenosis, prediabetes, and chronic MSK pain.  Last seen by me to establish care on 9/7.  At that time his acute concern was bilateral lower extremity edema and progressive dyspnea on exertion.  I recommended that he increase Lasix to 40 mg in the morning to see if this helps with symptom relief.  He returns today for reassessment and to review labs from 9/7.  In the interim, he has been seen by pulmonology, Dr. Melvyn Novas, on 9/12.  Sleep study pending.  He will be switching to Spiriva but remains on Breztri for now until pulmonology follow-up.  Today Jerry Aguirre feels fairly well.  He states that his dyspnea on exertion has not improved.  He is still waiting to undergo PSG.  He endorses 2-day history of right flank pain.  He believes he has strained a muscle.  He has pain when he coughs.  Denies pain with urination.  Pain did not radiate into his groin.  He has not noted any blood in his urine or stool.  Jerry Aguirre is also endorsing symptoms of congestion today and request recommendations for over-the-counter medications.  He has a chronic productive cough in setting of COPD but states it has not changed in quality or quantity.  Past Medical History:  Diagnosis Date   COPD (chronic obstructive pulmonary disease) (HCC)    GERD (gastroesophageal reflux disease)    Headache    Hyperlipidemia    Hypertension    Sleep apnea    pt says his "OSA was mild and they said he did not need a CPAP"   Past Surgical History:  Procedure Laterality Date   HERNIA REPAIR Right    inguinal   LUMBAR LAMINECTOMY/DECOMPRESSION MICRODISCECTOMY N/A 04/21/2021    Procedure: LAMINECTOMY, MEDIAL FACETECTOMY LUMBAR FOUR- FIVE;  Surgeon: Vallarie Mare, MD;  Location: [facility];  Service: Neurosurgery;  Laterality: N/A;   TONSILLECTOMY     removed as a child   Social History   Tobacco Use   Smoking status: Every Day    Packs/day: 1.00    Types: Cigarettes   Smokeless tobacco: Never   Tobacco comments:    1 PPD  Vaping Use   Vaping Use: Never used  Substance Use Topics   Alcohol use: Not Currently   Drug use: Yes    Types: Marijuana    Comment: twice a week   No family history on file. Allergies  Allergen Reactions   Lisinopril Itching    headaches   Lopressor [Metoprolol Tartrate]     Unknown to pt    Review of Systems  Constitutional:  Negative for chills, fever, malaise/fatigue and weight loss.  HENT:  Positive for congestion and sinus pain. Negative for ear discharge, ear pain and sore throat.   Respiratory:  Negative for cough and sputum production.        Dyspnea on exertion  Cardiovascular:  Positive for leg swelling. Negative for chest pain and orthopnea.  Gastrointestinal:  Negative for nausea and vomiting.  Genitourinary:  Positive for flank pain. Negative for dysuria, frequency, hematuria and urgency.  All other systems reviewed  and are negative.     Objective:     BP 118/78   Pulse 82   Ht '5\' 4"'  (1.626 m)   Wt 205 lb 6.4 oz (93.2 kg)   SpO2 92%   BMI 35.26 kg/m    Physical Exam Vitals reviewed.  Constitutional:      General: He is not in acute distress.    Appearance: Normal appearance. He is obese. He is not ill-appearing.     Comments: Appears older than stated age  HENT:     Head: Normocephalic and atraumatic.     Nose: Congestion present.     Comments: Boggy nasal turbinates    Mouth/Throat:     Mouth: Mucous membranes are moist.     Pharynx: Oropharynx is clear.  Eyes:     Extraocular Movements: Extraocular movements intact.     Conjunctiva/sclera: Conjunctivae normal.     Pupils: Pupils are  equal, round, and reactive to light.  Cardiovascular:     Rate and Rhythm: Normal rate and regular rhythm.     Pulses: Normal pulses.     Heart sounds: Normal heart sounds. No murmur heard. Pulmonary:     Effort: Pulmonary effort is normal.     Breath sounds: Normal breath sounds. No wheezing, rhonchi or rales.  Abdominal:     General: Abdomen is flat. Bowel sounds are normal. There is no distension.     Palpations: Abdomen is soft.     Tenderness: There is no abdominal tenderness. There is no right CVA tenderness or left CVA tenderness.     Comments: There is tenderness to palpation in along the right lumbar spine and right flank  Musculoskeletal:        General: Swelling present. No deformity. Normal range of motion.     Cervical back: Normal range of motion.     Right lower leg: Edema present.     Left lower leg: Edema present.     Comments: 2+ pitting bilateral lower extremity edema on exam  Skin:    General: Skin is warm and dry.     Capillary Refill: Capillary refill takes less than 2 seconds.  Neurological:     General: No focal deficit present.     Mental Status: He is alert and oriented to person, place, and time.     Motor: No weakness.  Psychiatric:        Mood and Affect: Mood normal.        Behavior: Behavior normal.        Thought Content: Thought content normal.    Last CBC Lab Results  Component Value Date   WBC 8.5 09/22/2021   HGB 14.7 09/22/2021   HCT 44.1 09/22/2021   MCV 90 09/22/2021   MCH 30.1 09/22/2021   RDW 13.6 09/22/2021   PLT 274 28/76/8115   Last metabolic panel Lab Results  Component Value Date   GLUCOSE 95 09/22/2021   NA 143 09/22/2021   K 4.1 09/22/2021   CL 102 09/22/2021   CO2 25 09/22/2021   BUN 15 09/22/2021   CREATININE 1.13 09/22/2021   EGFR 76 09/22/2021   CALCIUM 9.9 09/22/2021   PROT 7.1 09/22/2021   ALBUMIN 4.6 09/22/2021   LABGLOB 2.5 09/22/2021   AGRATIO 1.8 09/22/2021   BILITOT 0.7 09/22/2021   ALKPHOS 98  09/22/2021   AST 21 09/22/2021   ALT 27 09/22/2021   ANIONGAP 13 09/08/2021   Last lipids Lab Results  Component Value Date  CHOL 199 09/22/2021   HDL 42 09/22/2021   LDLCALC 130 (H) 09/22/2021   TRIG 153 (H) 09/22/2021   CHOLHDL 4.7 09/22/2021   Last hemoglobin A1c Lab Results  Component Value Date   HGBA1C 6.2 (H) 09/22/2021   Last thyroid functions Lab Results  Component Value Date   TSH 1.410 09/13/2021   Last vitamin D Lab Results  Component Value Date   VD25OH 25.4 (L) 09/22/2021   Last vitamin B12 and Folate Lab Results  Component Value Date   VITAMINB12 272 09/22/2021   FOLATE >20.0 09/22/2021    The ASCVD Risk score (Arnett DK, et al., 2019) failed to calculate for the following reasons:   The patient has a prior MI or stroke diagnosis    Assessment & Plan:   Problem List Items Addressed This Visit       Respiratory   Nasal sinus congestion    Today he endorses sinus congestion.  He has been using a saline rinse but his symptoms have not improved.  He denies fever/chills, productive cough, and shortness of breath.  He did not have tenderness to palpation of his maxillary and frontal sinuses on exam. -I recommended conservative treatment measures for now, specifically continued use of saline rinse followed by fluticasone.  I recommended routine use of cetirizine.  He can also try a nasal decongestant.  I instructed him to return to care if he develops significant sinus tenderness, fever/chills, or a productive cough with a change in sputum quality or quantity.        Other   Dyslipidemia - Primary    Lipid panel updated 9/7.  LDL remains above goal.  He is currently prescribed atorvastatin 40 mg daily. -Add ezetimibe 10 mg daily      Relevant Medications   ezetimibe (ZETIA) 10 MG tablet   Semaglutide,0.25 or 0.5MG/DOS, (OZEMPIC, 0.25 OR 0.5 MG/DOSE,) 2 MG/3ML SOPN   Prediabetes    HbA1c 6.2.  He is not currently on any medications for  prediabetes.  Jerry Aguirre expresses an interest in losing weight.  His BMI is 35. -Through shared decision making, Jerry Aguirre has agreed to start GLP-1 agonist therapy.  I have prescribed Ozempic today. -Plan for follow-up in 4 weeks.      Relevant Medications   Semaglutide,0.25 or 0.5MG/DOS, (OZEMPIC, 0.25 OR 0.5 MG/DOSE,) 2 MG/3ML SOPN   Preventative health care    -States that he is scheduled for colonoscopy next month -Awaiting outside records to update vaccinations      Vitamin D insufficiency    Total vitamin D level 25.4 earlier this month. -Daily vitamin D supplementation, 2000 units, recommended      BMI 35.0-35.9,adult    Jerry Aguirre is aware that he needs to lose weight. -Through shared decision-making, he is in agreement with starting GLP-1 agonist therapy today.  Ozempic 0.25 milligrams weekly injection x4 weeks has been prescribed.  We will plan for follow-up in 4 weeks for reassessment.  Plan increase to 0.5 mg weekly injections at that time.      Relevant Medications   Semaglutide,0.25 or 0.5MG/DOS, (OZEMPIC, 0.25 OR 0.5 MG/DOSE,) 2 MG/3ML SOPN   Right flank pain    There is tenderness over the right lumbar spine that radiates around his right side today.  He denies associated symptoms of dysuria and materia.  He states that he has had approximately 25 kidney stones in the past and that his current pain does not feel like pain he has previously experienced during  a kidney stone.  He believes he is strained a muscle. -Continue conservative measures for now -I recommended that he return to care if symptoms not improve within 1 week       Return in about 4 weeks (around 11/03/2021).    Johnette Abraham, MD

## 2021-10-06 NOTE — Assessment & Plan Note (Signed)
Lipid panel updated 9/7.  LDL remains above goal.  He is currently prescribed atorvastatin 40 mg daily. -Add ezetimibe 10 mg daily

## 2021-10-06 NOTE — Assessment & Plan Note (Signed)
Today he endorses sinus congestion.  He has been using a saline rinse but his symptoms have not improved.  He denies fever/chills, productive cough, and shortness of breath.  He did not have tenderness to palpation of his maxillary and frontal sinuses on exam. -I recommended conservative treatment measures for now, specifically continued use of saline rinse followed by fluticasone.  I recommended routine use of cetirizine.  He can also try a nasal decongestant.  I instructed him to return to care if he develops significant sinus tenderness, fever/chills, or a productive cough with a change in sputum quality or quantity.

## 2021-10-10 ENCOUNTER — Other Ambulatory Visit: Payer: Self-pay

## 2021-10-10 MED ORDER — FUROSEMIDE 20 MG PO TABS: 20.0000 mg | ORAL_TABLET | Freq: Two times a day (BID) | ORAL | 3 refills | Status: DC

## 2021-10-12 ENCOUNTER — Other Ambulatory Visit (INDEPENDENT_AMBULATORY_CARE_PROVIDER_SITE_OTHER): Payer: Self-pay

## 2021-10-12 DIAGNOSIS — Z1211 Encounter for screening for malignant neoplasm of colon: Secondary | ICD-10-CM

## 2021-10-20 ENCOUNTER — Telehealth: Payer: Self-pay | Admitting: Internal Medicine

## 2021-10-20 NOTE — Telephone Encounter (Signed)
Called patients sister, she advised for [name] to call patient since she was unaware he needed samples.   ATC patient and no answer. If patient calls back and needs a breztri inhaler I will leave a sample for him at front, if he needs albuterol inhaler I will send in refill.

## 2021-10-20 NOTE — Telephone Encounter (Signed)
ATC patient again.  Will close encounter since we have attempted to contact patient X2.

## 2021-10-21 ENCOUNTER — Encounter: Payer: Self-pay | Admitting: Internal Medicine

## 2021-10-28 ENCOUNTER — Other Ambulatory Visit: Payer: Self-pay | Admitting: Internal Medicine

## 2021-11-01 ENCOUNTER — Other Ambulatory Visit: Payer: Self-pay | Admitting: Internal Medicine

## 2021-11-03 ENCOUNTER — Other Ambulatory Visit: Payer: Self-pay

## 2021-11-03 DIAGNOSIS — F17210 Nicotine dependence, cigarettes, uncomplicated: Secondary | ICD-10-CM

## 2021-11-03 DIAGNOSIS — Z87891 Personal history of nicotine dependence: Secondary | ICD-10-CM

## 2021-11-03 DIAGNOSIS — Z122 Encounter for screening for malignant neoplasm of respiratory organs: Secondary | ICD-10-CM

## 2021-11-08 ENCOUNTER — Ambulatory Visit (INDEPENDENT_AMBULATORY_CARE_PROVIDER_SITE_OTHER): Payer: Medicaid Other | Admitting: Internal Medicine

## 2021-11-08 ENCOUNTER — Other Ambulatory Visit: Payer: Self-pay | Admitting: Internal Medicine

## 2021-11-08 ENCOUNTER — Encounter: Payer: Self-pay | Admitting: Internal Medicine

## 2021-11-08 DIAGNOSIS — F17210 Nicotine dependence, cigarettes, uncomplicated: Secondary | ICD-10-CM

## 2021-11-08 DIAGNOSIS — G4736 Sleep related hypoventilation in conditions classified elsewhere: Secondary | ICD-10-CM

## 2021-11-08 DIAGNOSIS — R490 Dysphonia: Secondary | ICD-10-CM | POA: Diagnosis not present

## 2021-11-08 DIAGNOSIS — J4489 Other specified chronic obstructive pulmonary disease: Secondary | ICD-10-CM

## 2021-11-08 DIAGNOSIS — J449 Chronic obstructive pulmonary disease, unspecified: Secondary | ICD-10-CM | POA: Diagnosis not present

## 2021-11-08 MED ORDER — ALBUTEROL SULFATE HFA 108 (90 BASE) MCG/ACT IN AERS: 2.0000 | INHALATION_SPRAY | Freq: Four times a day (QID) | RESPIRATORY_TRACT | 3 refills | Status: DC | PRN

## 2021-11-08 MED ORDER — MELOXICAM 7.5 MG PO TABS: 7.5000 mg | ORAL_TABLET | Freq: Two times a day (BID) | ORAL | 3 refills | Status: DC

## 2021-11-08 MED ORDER — SPIRIVA RESPIMAT 2.5 MCG/ACT IN AERS: INHALATION_SPRAY | RESPIRATORY_TRACT | 11 refills | Status: DC

## 2021-11-08 MED ORDER — AMLODIPINE BESYLATE 10 MG PO TABS: 10.0000 mg | ORAL_TABLET | Freq: Every day | ORAL | 3 refills | Status: DC

## 2021-11-08 MED ORDER — BUDESONIDE-FORMOTEROL FUMARATE 160-4.5 MCG/ACT IN AERO: INHALATION_SPRAY | RESPIRATORY_TRACT | 12 refills | Status: DC

## 2021-11-08 MED ORDER — ALBUTEROL SULFATE HFA 108 (90 BASE) MCG/ACT IN AERS: 2.0000 | INHALATION_SPRAY | RESPIRATORY_TRACT | 11 refills | Status: DC | PRN

## 2021-11-08 NOTE — Patient Instructions (Signed)
Plan A = Automatic = Always=    Symbicort 160 Take 2 puffs first thing in am and then another 2 puffs about 12 hours later and chase the AM dose of symbicort with spriva x 2 puffs  Work on inhaler technique:  relax and gently blow all the way out then take a nice smooth full deep breath back in, triggering the inhaler at same time you start breathing in.  Hold breath in for at least  5 seconds if you can. Blow out symbicort  160  thru nose. Rinse and gargle with water when done.  If mouth or throat bother you at all,  try brushing teeth/gums/tongue with arm and hammer toothpaste/ make a slurry and gargle and spit out.       Plan B = Backup (to supplement plan A, not to replace it) Only use your albuterol inhaler as a rescue medication to be used if you can't catch your breath by resting or doing a relaxed purse lip breathing pattern.  - The less you use it, the better it will work when you need it. - Ok to use the inhaler up to 2 puffs  every 4 hours if you must but call for appointment if use goes up over your usual need - Don't leave home without it !!  (think of it like the spare tire for your car)    Also  Ok to try albuterol 15 min before an activity (on alternating days)  that you know would usually make you short of breath and see if it makes any difference and if makes none then don't take albuterol after activity unless you can't catch your breath as this means it's the resting that helps, not the albuterol.   My office will be contacting you by phone for referral to ENT eval in [place]   - if you don't hear back from my office within one week please call us back or notify us thru MyChart and we'll address it right away.    Please schedule a follow up visit in 3 months but call sooner if needed

## 2021-11-08 NOTE — Progress Notes (Unsigned)
Jerry Aguirre, male    DOB: 08/05/1965   MRN: [id number]   Brief patient profile:  18  yowm active smoker/MM  referred to pulmonary clinic 07/27/2021 by NP [name] in [place]  for breathing        History of Present Illness  07/27/2021  Pulmonary/ 1st office eval/[name]  Chief Complaint  Patient presents with   Consult    Sob with exertion x 2 yrs., cough-green occass.,o2 sats 64-70's occass.  Dyspnea:  walks with crutch due to knees / walking to chicken house and up the ramp the house  Push mower x 5 min / [employer] from one end of store to other  Cough: worse in am / sometimes green on abx day 7 keflex  Sleep: no cpap / 2.5 lpm hs x 2009 and hypersomnolent, seeing neuro for cpap but not able to work out sleep issues/cpap effectiveness so far SABA use: four times a day  plus symb Rec My office will be contacting you by phone for referral to sleep medicine in [place] and you should not drive until this problem is corrected > not seen as of 09/13/2021 and not on schedule to see  Plan A = Automatic = Always=    symbicort 80 Take 2 puffs first thing in am and then another 2 puffs about 12 hours later.  Work on inhaler technique: Plan B = Backup (to supplement plan A, not to replace it) Only use your albuterol inhaler as a rescue medication  Make sure you check your oxygen saturation  AT  your highest level of activity (not after you stop)   to be sure it stays over 90% Please remember to go to the lab   Allergy screen 0.3/   IgE 462    alpha one AT phenotype  MM  Level 158         09/13/2021  f/u ov/[place] office/Embry Manrique re: GOLD 3  maint on symbicort 80 c/o swelling all over Chief Complaint  Patient presents with   Follow-up    Has not had sleep apt scheduled- no one called.  Breathing has worsened since last ov.   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 but did not check sats as rec  Cough: very light green, worse in am / no better p prednisone or abx  Sleeping: very  poorly in lift chair / only sleeps 2 h then awake rest of night/ was told by neuro he does not have osa but pt wants second opinion  SABA use: 3 x daily helps some  02: 2.5 lpm at hs and prn daytime but not titrating to sats  Covid status: none, flatly declines to consider  Lung cancer screening: eligible, rec  Rec My office will be contacting you by phone for referral to Sleep Medicine  (do drive in meantime)  - if you don't hear back from my office within one week please call Jerry Aguirre back or notify Jerry Aguirre thru MyChart and we'll address it right away.  Protonix 40 mg Take 30- 60 min before your first and last meals of the day  GERD diet  For cough > mucinex dm 1200 mg every 12 hours as needed  Plan A = Automatic = Always=    Change symbicort  to Breztri Take 2 puffs first thing in am and then another 2 puffs about 12 hours later.   Plan B = Backup (to supplement plan A, not to replace it) Only use your albuterol inhaler as a  rescue medication  Make sure you check your oxygen saturation  AT  your highest level of activity (not after you stop)  The key is to stop smoking completely before smoking completely stops you! Please schedule a follow up office visit in 2 weeks, sooner if needed  with all medications /inhalers/ solutions in hand so we can verify exactly what you are taking. This includes all medications from all doctors and over the counters  - if getting worse on present plan go to ER  ADDENDUM:  instructions should say do NOT  drive in meantime/expedite referral to sleep medicine    09/27/2021  f/u ov/[place] office/[name] re: GOLD 3 copd/ smoking  maint on breztri   Chief Complaint  Patient presents with   Follow-up    He is doing good during the day and he is worse at night, he is having some congestion.    Dyspnea:  better walking to chickens/ still smoking  Cough: mucoid, assoc with worse  nasal congestion  Sleeping: in a lift  chair feels suffocating, resolves p 5 min with or without  saba SABA use: using way to much, not taking ppi as rec  02: 2.5 lpm  Covid status: refuses to consider  Lung cancer screening: done  Rec Plan A = Automatic = Always=    Breztri Take 2 puffs first thing in am and then another 2 puffs about 12 hours later.   Work on inhaler technique:  Plan B = Backup (to supplement plan A, not to replace it) Only use your albuterol inhaler as a rescue medication Ok to try albuterol 15 min before an activity (on alternating days)  that you know would usually make you short of breath  Protonix (pantoprazole) is  Take 30- 60 min before your first and last meals of the day     11/08/2021  f/u ov/[place] office/[name] re: GOLD 3 / still smoking  maint on Breztri   Chief Complaint  Patient presents with   Follow-up    Breathing is getting worse. Is out of breztri inhaler    Dyspnea:  walking at [employer] half way to back slower than others = MMRC3 = can't walk 100 yards even at a slow pace at a flat grade s stopping due to sob   Cough: hoarse/ white  Sleeping: lift chair 45 degrees/ suffocating immediately > sleep stud pending  SABA use: none now  02:  2.5 lpm hs only  Covid status: refuses vax Lung cancer screening: scheduled for 12/21/21    No obvious day to day or daytime variability or assoc excess/ purulent sputum or mucus plugs or hemoptysis or cp or chest tightness, subjective wheeze or overt sinus or hb symptoms.    Also denies any obvious fluctuation of symptoms with weather or environmental changes or other aggravating or alleviating factors except as outlined above   No unusual exposure hx or h/o childhood pna/ asthma or knowledge of premature birth.  Current Allergies, Complete Past Medical History, Past Surgical History, Family History, and Social History were reviewed in [employer] record.  ROS  The following are not active complaints unless bolded Hoarseness, sore throat, dysphagia, dental problems, itching,  sneezing,  nasal congestion or discharge of excess mucus or purulent secretions, ear ache,   fever, chills, sweats, unintended wt loss or wt gain, classically pleuritic or exertional cp,  orthopnea pnd or arm/hand swelling  or leg swelling, presyncope, palpitations, abdominal pain, anorexia, nausea, vomiting, diarrhea  or change in  bowel habits or change in bladder habits, change in stools or change in urine, dysuria, hematuria,  rash, arthralgias, visual complaints, headache, numbness, weakness or ataxia or problems with walking or coordination,  change in mood or  memory.        Current Meds  Medication Sig   albuterol (VENTOLIN HFA) 108 (90 Base) MCG/ACT inhaler Inhale 2 puffs into the lungs every 6 (six) hours as needed for wheezing or shortness of breath.   amLODipine (NORVASC) 10 MG tablet Take 1 tablet (10 mg total) by mouth at bedtime.   aspirin EC 81 MG EC tablet Take 1 tablet (81 mg total) by mouth daily. Swallow whole.   atorvastatin (LIPITOR) 40 MG tablet Take 1 tablet (40 mg total) by mouth daily at 6 PM.   baclofen (LIORESAL) 10 MG tablet Take 1 tablet (10 mg total) by mouth 2 (two) times daily.   Budeson-Glycopyrrol-Formoterol (BREZTRI AEROSPHERE) 160-9-4.8 MCG/ACT AERO Take 2 puffs first thing in am and then another 2 puffs about 12 hours later.   cetirizine (ZYRTEC) 10 MG tablet TAKE 1 TABLET BY MOUTH ONCE DAILY AS NEEDED FOR ALLERGY SYMPTOMS.   docusate sodium (COLACE) 100 MG capsule Take 1 capsule (100 mg total) by mouth 2 (two) times daily.   ezetimibe (ZETIA) 10 MG tablet Take 1 tablet (10 mg total) by mouth daily.   furosemide (LASIX) 20 MG tablet Take 1 tablet (20 mg total) by mouth 2 (two) times daily. Take 20 mg by mouth twice daily.   lactulose (CHRONULAC) 10 GM/15ML solution Take 10 g by mouth at bedtime.   meloxicam (MOBIC) 7.5 MG tablet Take 1 tablet (7.5 mg total) by mouth 2 (two) times daily.   metoprolol succinate (TOPROL-XL) 25 MG 24 hr tablet TAKE ONE TABLET BY  MOUTH ONCE DAILY AT BEDTIME.   ondansetron (ZOFRAN) 4 MG tablet Take 4 mg by mouth 2 (two) times daily.   oxyCODONE-acetaminophen (PERCOCET) 10-325 MG tablet Take 1 tablet by mouth every 6 (six) hours as needed for pain.   pantoprazole (PROTONIX) 40 MG tablet Take 40 mg by mouth 2 (two) times daily.   pramipexole (MIRAPEX) 0.75 MG tablet Take 0.75-1.5 mg by mouth See admin instructions. Take 0.75 mg in the morning and 1.5 mg at night   pregabalin (LYRICA) 150 MG capsule Take 150 mg by mouth at bedtime.   triamterene-hydrochlorothiazide (DYAZIDE) 37.5-25 MG capsule Take 1 capsule by mouth daily.   zolpidem (AMBIEN) 10 MG tablet Take 10 mg by mouth at bedtime as needed.                        Past Medical History:  Diagnosis Date   COPD (chronic obstructive pulmonary disease) (HCC)    GERD (gastroesophageal reflux disease)    Headache    Hyperlipidemia    Hypertension    Sleep apnea    pt says his "OSA was mild and they said he did not need a CPAP"      Objective:    Wts   11/08/2021     211  09/27/21 203 lb 12.8 oz (92.4 kg)  09/22/21 209 lb 12.8 oz (95.2 kg)  09/13/21 211 lb 9.6 oz (96 kg)    Vital signs reviewed  11/08/2021  - Note at rest 02 sats  94% on RA    General appearance:    hoarse amb wm nad  / moderate pseudowheeze    HEENT :  Oropharynx  clear   Nasal  turbinates nl    NECK :  without JVD/Nodes/TM/ nl carotid upstrokes bilaterally   LUNGS: no acc muscle use,  Mod barrel  contour chest wall with bilateral  Distant bs s audible wheeze and  without cough on insp or exp maneuvers and mod  Hyperresonant  to  percussion bilaterally     CV:  RRR  no s3 or murmur or increase in P2, and 1+ bilateral LE pitting edema   ABD:  soft and nontender with pos mid insp Hoover's  in the supine position. No bruits or organomegaly appreciated, bowel sounds nl  MS:  walks with cane/  Ext warm without deformities or   obvious joint restrictions , calf tenderness, cyanosis  or clubbing  SKIN: warm and dry without lesions    NEURO:  alert, approp, nl sensorium with  no motor or cerebellar deficits apparent.                                    Assessment

## 2021-11-09 ENCOUNTER — Telehealth: Payer: Self-pay

## 2021-11-09 ENCOUNTER — Ambulatory Visit (INDEPENDENT_AMBULATORY_CARE_PROVIDER_SITE_OTHER): Payer: Medicaid Other | Admitting: Internal Medicine

## 2021-11-09 ENCOUNTER — Encounter: Payer: Self-pay | Admitting: Internal Medicine

## 2021-11-09 VITALS — BP 117/74 | HR 90 | Ht 63.0 in | Wt 209.2 lb

## 2021-11-09 DIAGNOSIS — M25511 Pain in right shoulder: Secondary | ICD-10-CM | POA: Diagnosis not present

## 2021-11-09 DIAGNOSIS — E669 Obesity, unspecified: Secondary | ICD-10-CM | POA: Diagnosis not present

## 2021-11-09 DIAGNOSIS — R600 Localized edema: Secondary | ICD-10-CM

## 2021-11-09 DIAGNOSIS — I10 Essential (primary) hypertension: Secondary | ICD-10-CM | POA: Diagnosis not present

## 2021-11-09 DIAGNOSIS — G8929 Other chronic pain: Secondary | ICD-10-CM

## 2021-11-09 MED ORDER — FUROSEMIDE 20 MG PO TABS: 20.0000 mg | ORAL_TABLET | Freq: Two times a day (BID) | ORAL | 3 refills | Status: DC

## 2021-11-09 MED ORDER — SEMAGLUTIDE-WEIGHT MANAGEMENT 0.25 MG/0.5ML ~~LOC~~ SOAJ: 0.2500 mg | SUBCUTANEOUS | 0 refills | Status: AC

## 2021-11-09 MED ORDER — MELOXICAM 7.5 MG PO TABS: 7.5000 mg | ORAL_TABLET | Freq: Two times a day (BID) | ORAL | 3 refills | Status: AC

## 2021-11-09 NOTE — Progress Notes (Signed)
Established Patient Office Visit  Subjective   Patient ID: Jerry Aguirre, male    DOB: 09/08/1965  Age: 56 y.o. MRN: [id number]  Chief Complaint  Patient presents with   Follow-up   [name] returns to care today.  He is a 56 year old male with a past medical history significant for COPD, HTN, GERD, current tobacco use, lumbar stenosis, prediabetes, chronic MSK pain.  He was last seen by me on 9/21 in follow-up.  At that time he endorsed sinus congestion.  Conservative treatment was recommended.  Ezetimibe 10 mg daily was added due to dyslipidemia.  Ozempic was also prescribed for treatment of obesity/prediabetes.  4-week follow-up was arranged.  In the interim, he has been seen by pulmonology in follow-up.  Today [name] endorses chronic right shoulder pain that has significantly worsened in the last 3 weeks.  He endorses pain with movement in all directions, most notably abduction.  He additionally endorses persistent bilateral lower extremity edema.  Denies orthopnea/PND currently.  He is chronically short of breath and has dyspnea on exertion due to COPD.  [name] states that he was never able to fill Ozempic as it was not covered by his insurance.  He is interested in other options today.  Acute concerns, chronic medical conditions, and outstanding preventative care items discussed today are individually addressed in A/P below.  Past Medical History:  Diagnosis Date   COPD (chronic obstructive pulmonary disease) (HCC)    GERD (gastroesophageal reflux disease)    Headache    Hyperlipidemia    Hypertension    Sleep apnea    pt says his "OSA was mild and they said he did not need a CPAP"   Past Surgical History:  Procedure Laterality Date   HERNIA REPAIR Right    inguinal   LUMBAR LAMINECTOMY/DECOMPRESSION MICRODISCECTOMY N/A 04/21/2021   Procedure: LAMINECTOMY, MEDIAL FACETECTOMY LUMBAR FOUR- FIVE;  Surgeon: Vallarie Mare, MD;  Location: [facility];  Service: Neurosurgery;   Laterality: N/A;   TONSILLECTOMY     removed as a child   Social History   Tobacco Use   Smoking status: Every Day    Packs/day: 1.00    Types: Cigarettes   Smokeless tobacco: Never   Tobacco comments:    1 PPD  Vaping Use   Vaping Use: Never used  Substance Use Topics   Alcohol use: Not Currently   Drug use: Yes    Types: Marijuana    Comment: twice a week   History reviewed. No pertinent family history. Allergies  Allergen Reactions   Lisinopril Itching    headaches   Lopressor [Metoprolol Tartrate]     Unknown to pt    Review of Systems  Cardiovascular:  Positive for leg swelling.  Musculoskeletal:  Positive for back pain (Chronic back pain) and joint pain (Right shoulder pain).  All other systems reviewed and are negative.    Objective:     BP 117/74   Pulse 90   Ht '5\' 3"'  (1.6 m)   Wt 209 lb 3.2 oz (94.9 kg)   SpO2 93%   BMI 37.06 kg/m  BP Readings from Last 3 Encounters:  11/09/21 117/74  11/08/21 132/84  10/06/21 118/78   Physical Exam Vitals reviewed.  Constitutional:      General: He is not in acute distress.    Appearance: He is obese. He is not ill-appearing.     Comments: Appears older than stated age  HENT:     Head: Normocephalic and  atraumatic.     Right Ear: External ear normal.     Left Ear: External ear normal.     Nose: Nose normal. No congestion or rhinorrhea.     Mouth/Throat:     Mouth: Mucous membranes are moist.     Pharynx: Oropharynx is clear.  Eyes:     Extraocular Movements: Extraocular movements intact.     Conjunctiva/sclera: Conjunctivae normal.     Pupils: Pupils are equal, round, and reactive to light.  Cardiovascular:     Rate and Rhythm: Normal rate and regular rhythm.     Pulses: Normal pulses.     Heart sounds: Normal heart sounds. No murmur heard. Pulmonary:     Effort: Pulmonary effort is normal.     Breath sounds: Normal breath sounds. No wheezing, rhonchi or rales.  Abdominal:     General: Abdomen is  flat. Bowel sounds are normal. There is no distension.     Palpations: Abdomen is soft.     Tenderness: There is no abdominal tenderness.  Musculoskeletal:        General: No swelling or deformity.     Cervical back: Normal range of motion.     Right lower leg: Edema (2+) present.     Left lower leg: Edema (2+) present.     Comments: Right shoulder No obvious deformity on inspection Active ROM generally limited in all directions secondary to pain, most notably with abduction.  I am able to passively abduct his right shoulder to 180 degrees. No tenderness palpation across the right shoulder Positive Hawkins/empty can  Skin:    General: Skin is warm and dry.     Capillary Refill: Capillary refill takes less than 2 seconds.  Neurological:     General: No focal deficit present.     Mental Status: He is alert and oriented to person, place, and time.     Motor: No weakness.  Psychiatric:        Mood and Affect: Mood normal.        Behavior: Behavior normal.        Thought Content: Thought content normal.    Last CBC Lab Results  Component Value Date   WBC 8.5 09/22/2021   HGB 14.7 09/22/2021   HCT 44.1 09/22/2021   MCV 90 09/22/2021   MCH 30.1 09/22/2021   RDW 13.6 09/22/2021   PLT 274 07/37/1062   Last metabolic panel Lab Results  Component Value Date   GLUCOSE 95 09/22/2021   NA 143 09/22/2021   K 4.1 09/22/2021   CL 102 09/22/2021   CO2 25 09/22/2021   BUN 15 09/22/2021   CREATININE 1.13 09/22/2021   EGFR 76 09/22/2021   CALCIUM 9.9 09/22/2021   PROT 7.1 09/22/2021   ALBUMIN 4.6 09/22/2021   LABGLOB 2.5 09/22/2021   AGRATIO 1.8 09/22/2021   BILITOT 0.7 09/22/2021   ALKPHOS 98 09/22/2021   AST 21 09/22/2021   ALT 27 09/22/2021   ANIONGAP 13 09/08/2021   Last lipids Lab Results  Component Value Date   CHOL 199 09/22/2021   HDL 42 09/22/2021   LDLCALC 130 (H) 09/22/2021   TRIG 153 (H) 09/22/2021   CHOLHDL 4.7 09/22/2021   Last hemoglobin A1c Lab Results   Component Value Date   HGBA1C 6.2 (H) 09/22/2021   Last thyroid functions Lab Results  Component Value Date   TSH 1.410 09/13/2021   Last vitamin D Lab Results  Component Value Date   VD25OH 25.4 (L) 09/22/2021  Last vitamin B12 and Folate Lab Results  Component Value Date   VITAMINB12 272 09/22/2021   FOLATE >20.0 09/22/2021     Assessment & Plan:   Problem List Items Addressed This Visit       Essential hypertension    BP 117/74 today.  His current regimen consist of amlodipine 10 mg daily, Toprol-XL 25 mg daily, and Dyazide 37.5-25 mg daily. -Through shared decision-making, we will discontinue amlodipine today as this may be causing his persistent bilateral lower extremity edema. -Follow-up in 2 weeks for BP check      Obesity (BMI 30-39.9) - Primary    Ozempic was previously prescribed due to obesity, however he was unable to fill this medication.  He remains interested in weight loss therapy today.  Mancel Parsons has been prescribed.  Follow-up in 2 weeks to see if he has been able to fill and start the medication.      Relevant Medications   Semaglutide-Weight Management 0.25 MG/0.5ML SOAJ   Bilateral lower extremity edema    Chronic, persistent issue.  Amlodipine has been discontinued today in case this is contributing to his edema.  I have also refilled Lasix.  Follow-up in 2 weeks.      Relevant Medications   furosemide (LASIX) 20 MG tablet   Right shoulder pain    Acute on chronic issue.  He states that his right shoulder pain has significantly worsened in the last 3 weeks.  He reports a history of a crush injury many years ago.  He has pain with movement of the right shoulder.  Abduction is the worst. -X-rays ordered today -I have refilled meloxicam as well -He was provided with home PT exercises for potential rotator cuff pathology      Relevant Orders   DG Shoulder Right   Return in about 2 weeks (around 11/23/2021).    Johnette Abraham, MD

## 2021-11-09 NOTE — Telephone Encounter (Signed)
-----   Message from Tanda Rockers, MD sent at 11/09/2021  8:48 AM EDT ----- Thx and sorry, you are right - the date on the care everywhere is the date is was filed, not done  I think he's really complicated so best to do in house - we'll ask Karishma Unrein to change to a split night as he is already scheduled and be sure has f/u with you to review it  Appreciate your help as always ----- Message ----- From: Chesley Mires, MD Sent: 11/09/2021   8:04 AM EDT To: Tanda Rockers, MD  The only sleep study I saw was from 2020.  This didn't show sleep apnea.  If you think his home nocturnal O2 needs are from sleep apnea, then you could get him set up with a home sleep study.  From what I understand, there is no wait time for home sleep studies through the [facility] where as there is a 2 month wait time for our home sleep study set ups.  If you think his nocturnal hypoxemia is from COPD and sleep apnea, then he should have a split night study ordered in the sleep lab.  [place]  ----- Message ----- From: Tanda Rockers, MD Sent: 11/09/2021   5:21 AM EDT To: Chesley Mires, MD  I will be referring this dude to you and have told him not to drive until his sleep study is repeated.  Could you do me a favor and look a the outside study from 2022 and see which sleep study ideally we should be ordering ?  Thx!

## 2021-11-09 NOTE — Assessment & Plan Note (Signed)
Counseled re importance of smoking cessation but did not meet time criteria for separate billing  8   LDSCT initial study pending > f/u lung cancer screening program/ Eric Form NP

## 2021-11-09 NOTE — Telephone Encounter (Signed)
Tanda Rockers, MD  Sharlot Gowda, CMA No let [place] [facility] handle it all and leave [name] out   ----- Message -----  From: Sharlot Gowda, CMA  Sent: 11/09/2021   9:38 AM EDT  To: Tanda Rockers, MD   I talked to the [facility] This patient has a NPSG ordered through Dr. Merlene Laughter. Per the PCCs we cant change another doctors orders so we can't change it to a split night study.

## 2021-11-09 NOTE — Assessment & Plan Note (Signed)
See sleep study  12/2018 - As of 07/27/2021 2.5 lpm hs and no desats walking - referred to sleep medicine 07/27/2021 >>> not scheduled to see as of 09/12/2021 > referred again 09/13/2021  - 11/08/2021   Walked on RA  x  3  lap(s) =  approx 450  ft  @ mod pace cane assist, stopped due to end of study c/o sob on laps 2/3  with lowest 02 sats 93%

## 2021-11-09 NOTE — Assessment & Plan Note (Addendum)
Referred to ENT 11/08/2021 >>>    Already on gerd rx/ active smoker so risk of larygeal ca   Discussed in detail all the  indications, usual  risks and alternatives  relative to the benefits with patient who agrees to proceed with w/u as outlined.     Each maintenance medication was reviewed in detail including emphasizing most importantly the difference between maintenance and prns and under what circumstances the prns are to be triggered using an action plan format where appropriate.  Total time for H and P, chart review, counseling, reviewing hfa/dpi device(s) , directly observing portions of ambulatory 02 saturation study/ and generating customized AVS unique to this office visit / same day charting = 30 min

## 2021-11-09 NOTE — Assessment & Plan Note (Signed)
Active smoking/MM Spirometry 07/27/2021  FEV1 1.4 (46%)  Ratio 0.63 and mildly concave   - 07/27/2021   Walked on RA  x  3  lap(s) =  approx 750  ft  @ avg/cane pace, stopped due to end of study with L Leg pain with lowest 02 sats  96%  -  07/27/2021  :  Allergy screen 0.3/   IgE 462    alpha one AT phenotype  MM  Level 158 - 07/27/2021 trial of symbicort 80 2bid since cough > doe    - 09/13/2021  After extensive coaching inhaler device,  effectiveness =   75% (short Ti) > changed to breztri 2bid as sob >> cough  - 11/08/2021  After extensive coaching inhaler device,  effectiveness =    75% SMI > spiriva/ symbicort 160   Group D (now reclassified as E) in terms of symptom/risk and laba/lama/ICS  therefore appropriate rx at this point >>>  breztri or symbicort 160/spiriva 2.5 due to insurance plus approp saba  Re SABA :  I spent extra time with pt today reviewing appropriate use of albuterol for prn use on exertion with the following points: 1) saba is for relief of sob that does not improve by walking a slower pace or resting but rather if the pt does not improve after trying this first. 2) If the pt is convinced, as many are, that saba helps recover from activity faster then it's easy to tell if this is the case by re-challenging : ie stop, take the inhaler, then p 5 minutes try the exact same activity (intensity of workload) that just caused the symptoms and see if they are substantially diminished or not after saba 3) if there is an activity that reproducibly causes the symptoms, try the saba 15 min before the activity on alternate days   If in fact the saba really does help, then fine to continue to use it prn but advised may need to look closer at the maintenance regimen being used to achieve better control of airways disease with exertion.

## 2021-11-09 NOTE — Patient Instructions (Signed)
It was a pleasure to see you today.  Thank you for giving [name] the opportunity to be involved in your care.  Below is a brief recap of your visit and next steps.  We will plan to see you again in 2 weeks.  Summary I have refilled meloxicam and lasix today I have also prescribed Wegovy for weight loss Right shoulder xrays were ordered today Please see that attached exercises for your shoulder Amlodipine has been discontinued today  Next steps Follow up in 2 weeks

## 2021-11-10 ENCOUNTER — Telehealth (INDEPENDENT_AMBULATORY_CARE_PROVIDER_SITE_OTHER): Payer: Self-pay

## 2021-11-10 ENCOUNTER — Encounter (INDEPENDENT_AMBULATORY_CARE_PROVIDER_SITE_OTHER): Payer: Self-pay

## 2021-11-10 MED ORDER — PEG 3350-KCL-NA BICARB-NACL 420 G PO SOLR: 4000.0000 mL | ORAL | 0 refills | Status: DC

## 2021-11-10 NOTE — Telephone Encounter (Signed)
Jerry Aguirre, CMA  ?

## 2021-11-10 NOTE — Telephone Encounter (Signed)
Referring MD/PCP: Dr Matthew Saras   Procedure: Tcs  Reason/Indication:  Screening  Has patient had this procedure before?  Yes   If so, when, by whom and where?  15 years ago  Is there a family history of colon cancer?  No   Who?  What age when diagnosed?    Is patient diabetic? If yes, Type 1 or Type 2   No       Does patient have prosthetic heart valve or mechanical valve?  No   Do you have a pacemaker/defibrillator?  No   Has patient ever had endocarditis/atrial fibrillation? No  Does patient use oxygen? yes  Has patient had joint replacement within last 12 months?  No   Is patient constipated or do they take laxatives? yes  Does patient have a history of alcohol/drug use?  No   Have you had a stroke/heart attack last 6 mths? No  Do you take medicine for weight loss?  No   For male patients,: have you had a hysterectomy n/a                      are you post menopausal n/a                      do you still have your menstrual cycle n/a  Is patient on blood thinner such as Coumadin, Plavix and/or Aspirin? yes  Medications: asa 81 mg daily, SEE EPIC  Allergies: nkda  Medication Adjustment per Dr Jenetta Downer NONE   Procedure date & time: 12/13/21 AT 900

## 2021-11-16 DIAGNOSIS — M25511 Pain in right shoulder: Secondary | ICD-10-CM | POA: Insufficient documentation

## 2021-11-16 DIAGNOSIS — R600 Localized edema: Secondary | ICD-10-CM | POA: Insufficient documentation

## 2021-11-16 NOTE — Assessment & Plan Note (Signed)
Chronic, persistent issue.  Amlodipine has been discontinued today in case this is contributing to his edema.  I have also refilled Lasix.  Follow-up in 2 weeks.

## 2021-11-16 NOTE — Assessment & Plan Note (Addendum)
Ozempic was previously prescribed due to obesity, however he was unable to fill this medication.  He remains interested in weight loss therapy today.  Mancel Parsons has been prescribed.  Follow-up in 2 weeks to see if he has been able to fill and start the medication.

## 2021-11-16 NOTE — Assessment & Plan Note (Signed)
Acute on chronic issue.  He states that his right shoulder pain has significantly worsened in the last 3 weeks.  He reports a history of a crush injury many years ago.  He has pain with movement of the right shoulder.  Abduction is the worst. -X-rays ordered today -I have refilled meloxicam as well -He was provided with home PT exercises for potential rotator cuff pathology

## 2021-11-16 NOTE — Assessment & Plan Note (Signed)
BP 117/74 today.  His current regimen consist of amlodipine 10 mg daily, Toprol-XL 25 mg daily, and Dyazide 37.5-25 mg daily. -Through shared decision-making, we will discontinue amlodipine today as this may be causing his persistent bilateral lower extremity edema. -Follow-up in 2 weeks for BP check

## 2021-11-19 ENCOUNTER — Encounter: Payer: Self-pay | Admitting: Internal Medicine

## 2021-11-20 ENCOUNTER — Emergency Department (HOSPITAL_COMMUNITY): Payer: Medicaid Other

## 2021-11-20 ENCOUNTER — Emergency Department (HOSPITAL_COMMUNITY)
Admission: EM | Admit: 2021-11-20 | Discharge: 2021-11-21 | Disposition: A | Discharge status: Home / Self Care | Payer: Medicaid Other | Attending: Emergency Medicine | Admitting: Emergency Medicine

## 2021-11-20 DIAGNOSIS — R2243 Localized swelling, mass and lump, lower limb, bilateral: Secondary | ICD-10-CM | POA: Insufficient documentation

## 2021-11-20 DIAGNOSIS — R140 Abdominal distension (gaseous): Secondary | ICD-10-CM | POA: Diagnosis not present

## 2021-11-20 DIAGNOSIS — R0602 Shortness of breath: Secondary | ICD-10-CM | POA: Insufficient documentation

## 2021-11-20 DIAGNOSIS — Z79899 Other long term (current) drug therapy: Secondary | ICD-10-CM | POA: Insufficient documentation

## 2021-11-20 DIAGNOSIS — E876 Hypokalemia: Secondary | ICD-10-CM | POA: Diagnosis not present

## 2021-11-20 DIAGNOSIS — R600 Localized edema: Secondary | ICD-10-CM

## 2021-11-20 LAB — CBC
HCT: 43.7 % (ref 39.0–52.0)
Hemoglobin: 14.5 g/dL (ref 13.0–17.0)
MCH: 30 pg (ref 26.0–34.0)
MCHC: 33.2 g/dL (ref 30.0–36.0)
MCV: 90.5 fL (ref 80.0–100.0)
Platelets: 292 10*3/uL (ref 150–400)
RBC: 4.83 MIL/uL (ref 4.22–5.81)
RDW: 13.9 % (ref 11.5–15.5)
WBC: 8.8 10*3/uL (ref 4.0–10.5)
nRBC: 0 % (ref 0.0–0.2)

## 2021-11-20 LAB — COMPREHENSIVE METABOLIC PANEL
ALT: 40 U/L (ref 0–44)
AST: 30 U/L (ref 15–41)
Albumin: 3.7 g/dL (ref 3.5–5.0)
Alkaline Phosphatase: 83 U/L (ref 38–126)
Anion gap: 8 (ref 5–15)
BUN: 12 mg/dL (ref 6–20)
CO2: 29 mmol/L (ref 22–32)
Calcium: 9 mg/dL (ref 8.9–10.3)
Chloride: 103 mmol/L (ref 98–111)
Creatinine, Ser: 1.22 mg/dL (ref 0.61–1.24)
GFR, Estimated: >60 mL/min (ref >60)
Glucose, Bld: 97 mg/dL (ref 70–99)
Potassium: 3.3 mmol/L — ABNORMAL LOW (ref 3.5–5.1)
Sodium: 140 mmol/L (ref 135–145)
Total Bilirubin: 1.1 mg/dL (ref 0.3–1.2)
Total Protein: 6.9 g/dL (ref 6.5–8.1)

## 2021-11-20 LAB — BRAIN NATRIURETIC PEPTIDE: B Natriuretic Peptide: 17 pg/mL (ref 0.0–100.0)

## 2021-11-20 MED ORDER — POTASSIUM CHLORIDE CRYS ER 20 MEQ PO TBCR
40.0000 meq | EXTENDED_RELEASE_TABLET | Freq: Once | ORAL | Status: AC
  Administered 2021-11-20: 40 meq via ORAL
  Filled 2021-11-20: qty 2

## 2021-11-20 MED ORDER — IOHEXOL 300 MG/ML  SOLN
100.0000 mL | Freq: Once | INTRAMUSCULAR | Status: AC | PRN
  Administered 2021-11-20: 100 mL via INTRAVENOUS

## 2021-11-20 MED ORDER — FUROSEMIDE 40 MG PO TABS: 20.0000 mg | ORAL_TABLET | Freq: Once | ORAL | Status: DC

## 2021-11-20 MED ORDER — FUROSEMIDE 10 MG/ML IJ SOLN
20.0000 mg | Freq: Once | INTRAMUSCULAR | Status: AC
  Administered 2021-11-20: 20 mg via INTRAVENOUS
  Filled 2021-11-20: qty 2

## 2021-11-20 NOTE — ED Triage Notes (Addendum)
Pt to ED with multiple complaints C/O bilat leg swelling, reports off lasix x 2 days. Pt also reports abdominal distention that he noticed today. Reports able to urinate. "My whole body is swelling up" Reports SHOB that started today.

## 2021-11-20 NOTE — ED Provider Notes (Signed)
[facility] EMERGENCY DEPARTMENT Provider Note   CSN: [id number] Arrival date & time: 11/20/21  1629     History  Chief Complaint  Patient presents with   Leg Swelling        abdominal distention   Shortness of Breath    Jerry Aguirre is a 56 y.o. male.  Patient presents to the hospital with a chief complaint of bilateral leg swelling.  He also reports abdominal distention which is intermittent in nature.  He reports slightly increased shortness of breath today but states this is close to his baseline.  Patient denies urinary symptoms, chest pain, abdominal pain, nausea, vomiting.  HPI     Home Medications Prior to Admission medications   Medication Sig Start Date End Date Taking? Authorizing Provider  albuterol (VENTOLIN HFA) 108 (90 Base) MCG/ACT inhaler Inhale 2 puffs into the lungs every 4 (four) hours as needed for wheezing or shortness of breath. 11/08/21   Tanda Rockers, MD  aspirin EC 81 MG EC tablet Take 1 tablet (81 mg total) by mouth daily. Swallow whole. 07/23/20   Debbe Odea, MD  atorvastatin (LIPITOR) 40 MG tablet Take 1 tablet (40 mg total) by mouth daily at 6 PM. 09/28/21   Johnette Abraham, MD  baclofen (LIORESAL) 10 MG tablet Take 1 tablet (10 mg total) by mouth 2 (two) times daily. 09/26/21   Johnette Abraham, MD  cetirizine (ZYRTEC) 10 MG tablet TAKE 1 TABLET BY MOUTH ONCE DAILY AS NEEDED FOR ALLERGY SYMPTOMS. 10/28/21   Johnette Abraham, MD  docusate sodium (COLACE) 100 MG capsule Take 1 capsule (100 mg total) by mouth 2 (two) times daily. 04/21/21 04/21/22  Vallarie Mare, MD  ezetimibe (ZETIA) 10 MG tablet Take 1 tablet (10 mg total) by mouth daily. 10/06/21   Johnette Abraham, MD  furosemide (LASIX) 20 MG tablet Take 1 tablet (20 mg total) by mouth 2 (two) times daily. Take 20 mg by mouth twice daily. 11/09/21   Johnette Abraham, MD  lactulose (CHRONULAC) 10 GM/15ML solution Take 10 g by mouth at bedtime. 03/21/21   [provider]  meloxicam  (MOBIC) 7.5 MG tablet Take 1 tablet (7.5 mg total) by mouth 2 (two) times daily. 11/09/21   Johnette Abraham, MD  metoprolol succinate (TOPROL-XL) 25 MG 24 hr tablet TAKE ONE TABLET BY MOUTH ONCE DAILY AT BEDTIME. 11/01/21   Johnette Abraham, MD  ondansetron (ZOFRAN) 4 MG tablet Take 4 mg by mouth 2 (two) times daily.    [provider]  oxyCODONE-acetaminophen (PERCOCET) 10-325 MG tablet Take 1 tablet by mouth every 6 (six) hours as needed for pain. 04/22/21 04/22/22  Viona Gilmore D, NP  pantoprazole (PROTONIX) 40 MG tablet Take 40 mg by mouth 2 (two) times daily.    [provider]  polyethylene glycol-electrolytes (TRILYTE) 420 g solution Take 4,000 mLs by mouth as directed. 11/10/21   Harvel Quale, MD  pramipexole (MIRAPEX) 0.75 MG tablet Take 0.75-1.5 mg by mouth See admin instructions. Take 0.75 mg in the morning and 1.5 mg at night 03/14/21   [provider]  pregabalin (LYRICA) 150 MG capsule Take 150 mg by mouth at bedtime. 09/07/21   [provider]  Semaglutide-Weight Management 0.25 MG/0.5ML SOAJ Inject 0.25 mg into the skin once a week for 28 days. 11/09/21 12/07/21  Johnette Abraham, MD  Tiotropium Bromide Monohydrate (SPIRIVA RESPIMAT) 2.5 MCG/ACT AERS 2 puffs each am 11/08/21   Tanda Rockers, MD  triamterene-hydrochlorothiazide (DYAZIDE) 37.5-25 MG capsule Take 1 capsule by mouth daily.    [provider]  zolpidem (AMBIEN) 10 MG tablet Take 10 mg by mouth at bedtime as needed. 04/18/21   [provider]      Allergies    Lisinopril and Lopressor [metoprolol tartrate]    Review of Systems   Review of Systems  Respiratory:  Positive for shortness of breath.   Cardiovascular:  Positive for leg swelling.  Gastrointestinal:  Positive for abdominal distention.    Physical Exam Updated Vital Signs BP (!) 145/78   Pulse 73   Temp 98 F (36.7 C) (Oral)   Resp 18   Ht '5\' 3"'$  (1.6 m)   Wt 94.8 kg   SpO2 98%   BMI  37.02 kg/m  Physical Exam Vitals and nursing note reviewed.  Constitutional:      General: He is not in acute distress.    Appearance: He is well-developed.  HENT:     Head: Normocephalic and atraumatic.  Eyes:     Extraocular Movements: Extraocular movements intact.     Conjunctiva/sclera: Conjunctivae normal.  Cardiovascular:     Rate and Rhythm: Normal rate and regular rhythm.     Heart sounds: No murmur heard. Pulmonary:     Effort: Pulmonary effort is normal. No respiratory distress.     Breath sounds: Normal breath sounds.  Abdominal:     Palpations: Abdomen is soft.     Tenderness: There is no abdominal tenderness.     Comments: Distention, possible hernia, noted when patient reclines backwards with feet on floor  Musculoskeletal:        General: No swelling.     Cervical back: Neck supple.     Right lower leg: Edema present.     Left lower leg: Edema present.  Skin:    General: Skin is warm and dry.     Capillary Refill: Capillary refill takes less than 2 seconds.  Neurological:     Mental Status: He is alert.  Psychiatric:        Mood and Affect: Mood normal.     ED Results / Procedures / Treatments   Labs (all labs ordered are listed, but only abnormal results are displayed) Labs Reviewed  COMPREHENSIVE METABOLIC PANEL - Abnormal; Notable for the following components:      Result Value   Potassium 3.3 (*)    All other components within normal limits  CBC  BRAIN NATRIURETIC PEPTIDE  URINALYSIS, ROUTINE W REFLEX MICROSCOPIC    EKG None  Radiology DG Chest 2 View  Result Date: 11/20/2021 CLINICAL DATA:  Shortness of breath EXAM: CHEST - 2 VIEW COMPARISON:  09/13/2021 FINDINGS: Cardiac size is within normal limits. There are no signs of pulmonary edema or focal pulmonary consolidation. There is no pleural effusion or pneumothorax. Azygous fissure is seen in right upper lung field. IMPRESSION: No active cardiopulmonary disease. Electronically Signed   By:  Elmer Picker M.D.   On: 11/20/2021 17:26    Procedures Procedures    Medications Ordered in ED Medications  iohexol (OMNIPAQUE) 300 MG/ML solution 100 mL (has no administration in time range)  potassium chloride SA (KLOR-CON M) CR tablet 40 mEq (40 mEq Oral Given 11/20/21 2311)  furosemide (LASIX) injection 20 mg (20 mg Intravenous Given 11/20/21 2310)    ED Course/ Medical Decision Making/ A&P  Medical Decision Making Amount and/or Complexity of Data Reviewed Labs: ordered. Radiology: ordered.  Risk Prescription drug management.   This patient presents to the ED for concern of abdominal distension, lower extremity edema, this involves an extensive number of treatment options, and is a complaint that carries with it a high risk of complications and morbidity.  The differential diagnosis includes fluid overload, lymphedema, hernia, ascites, and others   Co morbidities that complicate the patient evaluation  Hypertension, hyperlipidemia, COPD, bilateral lower extremity edema   Additional history obtained:  Additional history obtained from family at bedside External records from outside source obtained and reviewed including notes from primary care, telephone note from yesterday where patient requested refill of his Lasix   Lab Tests:  I Ordered, and personally interpreted labs.  The pertinent results include: Potassium 3.3, BNP and CBC unremarkable, UA pending collection   Imaging Studies ordered:  I ordered imaging studies including CT abdomen pelvis and chest x-ray.  No active disease on chest x-ray.  CT pending at this time    Cardiac Monitoring: / EKG:  The patient was maintained on a cardiac monitor.  I personally viewed and interpreted the cardiac monitored which showed an underlying rhythm of: Normal sinus rhythm  Problem List / ED Course / Critical interventions / Medication management   I ordered medication including Lasix  for lower extremity edema, potassium for hypokalemia Reevaluation of the patient after these medicines showed that the patient improved I have reviewed the patients home medicines and have made adjustments as needed   Test / Admission - Considered:  Patient care being transferred to Dr. Stark Jock at shift handoff.  Plan for discharge home pending CT abdomen results.  If CT negative for acute process discharge home.  Abdominal distention likely hernia, CT to evaluate for any strangulation.        Final Clinical Impression(s) / ED Diagnoses Final diagnoses:  Bilateral lower extremity edema    Rx / DC Orders ED Discharge Orders     None         Ronny Bacon 11/20/21 2334    Milton Ferguson, MD 11/21/21 1255

## 2021-11-21 ENCOUNTER — Other Ambulatory Visit: Payer: Self-pay | Admitting: Internal Medicine

## 2021-11-21 ENCOUNTER — Ambulatory Visit (HOSPITAL_COMMUNITY)
Admission: RE | Admit: 2021-11-21 | Discharge: 2021-11-21 | Disposition: A | Discharge status: Home / Self Care | Payer: Medicaid Other | Source: Ambulatory Visit | Attending: Internal Medicine | Admitting: Internal Medicine

## 2021-11-21 DIAGNOSIS — R600 Localized edema: Secondary | ICD-10-CM

## 2021-11-21 DIAGNOSIS — M25511 Pain in right shoulder: Secondary | ICD-10-CM | POA: Insufficient documentation

## 2021-11-21 MED ORDER — FUROSEMIDE 40 MG PO TABS: 40.0000 mg | ORAL_TABLET | Freq: Every day | ORAL | 1 refills | Status: DC

## 2021-11-21 NOTE — Progress Notes (Signed)
I spoke with the patient's sister, Jerry Aguirre, this afternoon.  Jerry Aguirre was increased to Lasix 40 mg twice daily at his last appointment.  He needs a new prescription today.  I have ordered Lasix 40 mg twice daily x90-day supply.  He will see me on Wednesday (11/8) for follow-up.

## 2021-11-21 NOTE — Telephone Encounter (Signed)
Please advise 

## 2021-11-21 NOTE — Discharge Instructions (Signed)
Follow-up with general surgery in the next few days.  The contact information for Dr. Constance Haw has been provided in this discharge summary for you to call and make these arrangements.

## 2021-11-22 ENCOUNTER — Other Ambulatory Visit: Payer: Self-pay | Admitting: Internal Medicine

## 2021-11-23 ENCOUNTER — Telehealth: Payer: Self-pay

## 2021-11-23 ENCOUNTER — Ambulatory Visit (INDEPENDENT_AMBULATORY_CARE_PROVIDER_SITE_OTHER): Payer: Medicaid Other | Admitting: Internal Medicine

## 2021-11-23 ENCOUNTER — Encounter: Payer: Self-pay | Admitting: Internal Medicine

## 2021-11-23 VITALS — BP 113/72 | HR 82 | Ht 63.5 in | Wt 221.2 lb

## 2021-11-23 DIAGNOSIS — I10 Essential (primary) hypertension: Secondary | ICD-10-CM | POA: Diagnosis not present

## 2021-11-23 DIAGNOSIS — J4489 Other specified chronic obstructive pulmonary disease: Secondary | ICD-10-CM

## 2021-11-23 DIAGNOSIS — K469 Unspecified abdominal hernia without obstruction or gangrene: Secondary | ICD-10-CM

## 2021-11-23 DIAGNOSIS — R600 Localized edema: Secondary | ICD-10-CM

## 2021-11-23 DIAGNOSIS — G8929 Other chronic pain: Secondary | ICD-10-CM

## 2021-11-23 DIAGNOSIS — M25511 Pain in right shoulder: Secondary | ICD-10-CM

## 2021-11-23 DIAGNOSIS — J449 Chronic obstructive pulmonary disease, unspecified: Secondary | ICD-10-CM

## 2021-11-23 MED ORDER — MEDICAL COMPRESSION STOCKINGS MISC: 2.0000 | Freq: Every day | 0 refills | Status: AC

## 2021-11-23 MED ORDER — FUROSEMIDE 40 MG PO TABS: 40.0000 mg | ORAL_TABLET | Freq: Two times a day (BID) | ORAL | 1 refills | Status: DC

## 2021-11-23 NOTE — Telephone Encounter (Signed)
Will wait for form to be faxed to our office by Lattie Haw.

## 2021-11-23 NOTE — Patient Instructions (Signed)
It was a pleasure to see you today.  Thank you for giving [name] the opportunity to be involved in your care.  Below is a brief recap of your visit and next steps.  We will plan to see you again in 4 weeks.  Summary Refill lasix today and let me know how things are going on 40 mg twice daily We will follow up in 4 weeks. I have referred you to orthopedic surgery for your surgery,

## 2021-11-23 NOTE — Telephone Encounter (Signed)
Patients sister asked if we had filled out a form for [employer].   Called and spoke with Lattie Haw at [facility] for clarification. She states that the patients medicare is requiring re-certification since Dr. Merlene Laughter is retiring and cannot re-certify patient.. The patient only uses O2 at night time (2LO2  cont) so she just needs a new order and a specific form filled out that she is going to fax to [name] and face to face notes from last ov. She wants to know if Dr. Melvyn Novas will take over patients O2 and fill the form out and fax back to them.   Dr. Melvyn Novas please advise, are you okay with taking over patients O2 from Dr. Merlene Laughter since Dr. Merlene Laughter is retiring?

## 2021-11-23 NOTE — Progress Notes (Signed)
Established Patient Office Visit  Subjective   Patient ID: Jerry Aguirre, male    DOB: Feb 06, 1965  Age: 56 y.o. MRN: [id number]  Chief Complaint  Patient presents with   Follow-up    Hernia    Jerry Aguirre returns to care today.  He is a 56 year old male with a past medical history significant for COPD, HTN, GERD, current tobacco use, lumbar stenosis, prediabetes, chronic MSK pain.  He was last seen by me on 10/25 at which time he endorsed chronic right shoulder pain that had worsened over the last 2 weeks.  He continued to endorse shortness of breath and dyspnea on exertion attributed to COPD.  We also discussed persistent bilateral lower extremity edema.  Right shoulder x-ray ordered, amlodipine was discontinued, Lasix was refilled, and Wegovy was prescribed for treatment of obesity.  In the interim, Jerry Aguirre presented to the emergency department on 11/5 due to bilateral lower extremity edema, abdominal distention, shortness of breath.  He was treated with IV Lasix and discharged after reassuring lab evaluation.  Jerry Aguirre continues to endorse bilateral lower extremity edema today.  He states that he is currently out of Lasix as he tried doubling his dose of 20 mg twice daily as we previously discussed.  He is requesting a refill today.  He continues to endorse right shoulder pain.  A CT obtained during his recent ED presentation did not show any acute intra-abdominal findings.  Jerry Aguirre sister is concerned about a ventral hernia as the underlying cause of many of his respiratory symptoms and has scheduled an appointment with general surgery on 11/28.  Past Medical History:  Diagnosis Date   COPD (chronic obstructive pulmonary disease) (HCC)    GERD (gastroesophageal reflux disease)    Headache    Hyperlipidemia    Hypertension    Sleep apnea    pt says his "OSA was mild and they said he did not need a CPAP"   Past Surgical History:  Procedure Laterality Date   HERNIA REPAIR Right     inguinal   LUMBAR LAMINECTOMY/DECOMPRESSION MICRODISCECTOMY N/A 04/21/2021   Procedure: LAMINECTOMY, MEDIAL FACETECTOMY LUMBAR FOUR- FIVE;  Surgeon: Vallarie Mare, MD;  Location: [facility];  Service: Neurosurgery;  Laterality: N/A;   TONSILLECTOMY     removed as a child   Social History   Tobacco Use   Smoking status: Every Day    Packs/day: 1.00    Types: Cigarettes   Smokeless tobacco: Never   Tobacco comments:    1 PPD  Vaping Use   Vaping Use: Never used  Substance Use Topics   Alcohol use: Not Currently   Drug use: Yes    Types: Marijuana    Comment: twice a week   History reviewed. No pertinent family history. Allergies  Allergen Reactions   Lisinopril Itching    headaches   Lopressor [Metoprolol Tartrate]     Unknown to pt    Review of Systems  Respiratory:  Positive for shortness of breath. Negative for cough, sputum production and wheezing.   Cardiovascular:  Positive for leg swelling. Negative for orthopnea and PND.  Musculoskeletal:  Positive for back pain (Chronic back pain) and joint pain (Right shoulder pain).  All other systems reviewed and are negative.    Objective:     BP 113/72   Pulse 82   Ht 5' 3.5" (1.613 m)   Wt 221 lb 3.2 oz (100.3 kg)   SpO2 90%   BMI 38.57 kg/m  BP  Readings from Last 3 Encounters:  11/23/21 113/72  11/21/21 (!) 150/71  11/09/21 117/74   Physical Exam Vitals reviewed.  Constitutional:      General: He is not in acute distress.    Appearance: Normal appearance. He is obese. He is not ill-appearing.  HENT:     Head: Normocephalic and atraumatic.     Right Ear: External ear normal.     Left Ear: External ear normal.     Nose: Nose normal. No congestion or rhinorrhea.     Mouth/Throat:     Mouth: Mucous membranes are moist.     Pharynx: Oropharynx is clear.  Eyes:     Extraocular Movements: Extraocular movements intact.     Conjunctiva/sclera: Conjunctivae normal.     Pupils: Pupils are equal, round, and  reactive to light.  Cardiovascular:     Rate and Rhythm: Normal rate and regular rhythm.     Pulses: Normal pulses.     Heart sounds: Normal heart sounds. No murmur heard. Pulmonary:     Effort: Pulmonary effort is normal.     Breath sounds: Normal breath sounds. No wheezing, rhonchi or rales.  Abdominal:     General: Bowel sounds are normal. There is no distension.     Tenderness: There is no abdominal tenderness.     Comments: Protuberant abdomen, ventral hernia difficult to appreciate  Musculoskeletal:        General: No swelling or deformity. Normal range of motion.     Cervical back: Normal range of motion.     Right lower leg: Edema (2+) present.     Left lower leg: Edema (2+) present.  Skin:    General: Skin is warm and dry.     Capillary Refill: Capillary refill takes less than 2 seconds.  Neurological:     General: No focal deficit present.     Mental Status: He is alert and oriented to person, place, and time.     Motor: No weakness.  Psychiatric:        Mood and Affect: Mood normal.        Behavior: Behavior normal.        Thought Content: Thought content normal.    Last CBC Lab Results  Component Value Date   WBC 8.8 11/20/2021   HGB 14.5 11/20/2021   HCT 43.7 11/20/2021   MCV 90.5 11/20/2021   MCH 30.0 11/20/2021   RDW 13.9 11/20/2021   PLT 292 58/85/0277   Last metabolic panel Lab Results  Component Value Date   GLUCOSE 97 11/20/2021   NA 140 11/20/2021   K 3.3 (L) 11/20/2021   CL 103 11/20/2021   CO2 29 11/20/2021   BUN 12 11/20/2021   CREATININE 1.22 11/20/2021   GFRNONAA >60 11/20/2021   CALCIUM 9.0 11/20/2021   PROT 6.9 11/20/2021   ALBUMIN 3.7 11/20/2021   LABGLOB 2.5 09/22/2021   AGRATIO 1.8 09/22/2021   BILITOT 1.1 11/20/2021   ALKPHOS 83 11/20/2021   AST 30 11/20/2021   ALT 40 11/20/2021   ANIONGAP 8 11/20/2021   Last lipids Lab Results  Component Value Date   CHOL 199 09/22/2021   HDL 42 09/22/2021   LDLCALC 130 (H) 09/22/2021    TRIG 153 (H) 09/22/2021   CHOLHDL 4.7 09/22/2021   Last hemoglobin A1c Lab Results  Component Value Date   HGBA1C 6.2 (H) 09/22/2021   Last thyroid functions Lab Results  Component Value Date   TSH 1.410 09/13/2021   Last vitamin  D Lab Results  Component Value Date   VD25OH 25.4 (L) 09/22/2021   Last vitamin B12 and Folate Lab Results  Component Value Date   VITAMINB12 272 09/22/2021   FOLATE >20.0 09/22/2021     Assessment & Plan:   Problem List Items Addressed This Visit       Essential hypertension    BP 113/72 today.  Amlodipine was discontinued at his last appointment due to concern for lower extremity edema.  He was continued on Toprol-XL 25 mg daily and Dyazide 37.5-25 mg daily. -No medication changes today.  Continue current regimen.      Bilateral lower extremity edema    Largely unchanged from previous exam.  Amlodipine has been discontinued.  I have refilled Lasix 40 mg twice daily today.  I have also prescribed compression stockings, which he should wear anytime he is out of bed.      Right shoulder pain - Primary    X-rays from 11/6 showed degenerative changes of the [facility] joint.  I have placed a referral to orthopedic surgery for evaluation and management.      Hernia, abdominal    Appointment scheduled with general surgery on 11/28 for evaluation of possible ventral hernia.      Return in about 4 weeks (around 12/21/2021).   Johnette Abraham, MD

## 2021-11-23 NOTE — Telephone Encounter (Signed)
Looks like I wanted him to see sleep medicine and let them take over but in meantime can write rx or do the ono on RA to prove he needs it.

## 2021-11-24 ENCOUNTER — Encounter: Payer: Self-pay | Admitting: Orthopedic Surgery

## 2021-11-24 NOTE — Telephone Encounter (Signed)
Form received. Will place in sign folder and pend O2 order. Dr. Melvyn Novas routing to you as an [place]

## 2021-11-29 ENCOUNTER — Encounter: Payer: Self-pay | Admitting: Internal Medicine

## 2021-11-29 DIAGNOSIS — K469 Unspecified abdominal hernia without obstruction or gangrene: Secondary | ICD-10-CM | POA: Insufficient documentation

## 2021-11-29 NOTE — Assessment & Plan Note (Signed)
Appointment scheduled with general surgery on 11/28 for evaluation of possible ventral hernia.

## 2021-11-29 NOTE — Assessment & Plan Note (Signed)
X-rays from 11/6 showed degenerative changes of the [facility] joint.  I have placed a referral to orthopedic surgery for evaluation and management.

## 2021-11-29 NOTE — Assessment & Plan Note (Addendum)
Largely unchanged from previous exam.  Amlodipine has been discontinued.  I have refilled Lasix 40 mg twice daily today.  I have also prescribed compression stockings, which he should wear anytime he is out of bed.

## 2021-11-29 NOTE — Assessment & Plan Note (Signed)
BP 113/72 today.  Amlodipine was discontinued at his last appointment due to concern for lower extremity edema.  He was continued on Toprol-XL 25 mg daily and Dyazide 37.5-25 mg daily. -No medication changes today.  Continue current regimen.

## 2021-11-30 ENCOUNTER — Other Ambulatory Visit: Payer: Self-pay | Admitting: Internal Medicine

## 2021-12-07 ENCOUNTER — Encounter (HOSPITAL_COMMUNITY)
Admission: RE | Admit: 2021-12-07 | Discharge: 2021-12-07 | Disposition: A | Discharge status: Home / Self Care | Payer: Medicaid Other | Source: Ambulatory Visit | Attending: Gastroenterology | Admitting: Gastroenterology

## 2021-12-12 ENCOUNTER — Ambulatory Visit: Payer: Medicaid Other | Admitting: Neurology

## 2021-12-12 ENCOUNTER — Ambulatory Visit (INDEPENDENT_AMBULATORY_CARE_PROVIDER_SITE_OTHER): Payer: Medicaid Other | Admitting: Orthopedic Surgery

## 2021-12-12 ENCOUNTER — Encounter: Payer: Self-pay | Admitting: Orthopedic Surgery

## 2021-12-12 ENCOUNTER — Telehealth (INDEPENDENT_AMBULATORY_CARE_PROVIDER_SITE_OTHER): Payer: Self-pay | Admitting: *Deleted

## 2021-12-12 DIAGNOSIS — M25511 Pain in right shoulder: Secondary | ICD-10-CM

## 2021-12-12 MED ORDER — METHYLPREDNISOLONE ACETATE 40 MG/ML IJ SUSP
40.0000 mg | Freq: Once | INTRAMUSCULAR | Status: AC
  Administered 2021-12-12: 40 mg via INTRA_ARTICULAR

## 2021-12-12 MED ORDER — CYCLOBENZAPRINE HCL 10 MG PO TABS: 10.0000 mg | ORAL_TABLET | Freq: Two times a day (BID) | ORAL | 0 refills | Status: DC | PRN

## 2021-12-12 NOTE — Telephone Encounter (Signed)
Thanks for the update

## 2021-12-12 NOTE — Telephone Encounter (Addendum)
Per Maudie Mercury in endo "left 2 VM for Delaney Schnick to call us about procedure wednesday. He did not show on that day for pre-op either. I just attempted to call again and the [phone] number a man answered and told me I had a wrong number. I called the [phone] and again left a VM for him to call us. We can do everything over the phone for tomorrows procedure if he will call because he has had recent labs. Thank you!"  I also called patient and no answer. Procedure for tomorrow cancelled. Looks like pt was a triage. FYI to Dr. Jenetta Downer

## 2021-12-12 NOTE — Pre-Procedure Instructions (Signed)
Messaged Mindy and Tammy at [facility] because we Dalia Heading been unable to contact patient about his pre-op. We have left 3 VM for him to call us back.

## 2021-12-12 NOTE — Patient Instructions (Signed)

## 2021-12-12 NOTE — Progress Notes (Signed)
New Patient Visit  Assessment: Jerry Aguirre is a 56 y.o. male with the following: 1. Acute pain of right shoulder  Plan: Jerry Aguirre has pain in his right shoulder, with limited function.  Pain started approximately 2 months ago.  He denies any injury.  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  He states he went to bed normal, but woke up with the deformity.  Since then, he is continue to have pain and limited the use of the right arm.  He says it is weak and painful.  He also notes numbness and tingling radiating into his right hand.  I am concerned that there is a possibility of cervical spine pathology.  He does have an MRI in the system from approximately 2 months ago, which demonstrates some stenosis.  He also notes that he has issues with his balance.  Equivocal Hoffmann's bilaterally.  He has difficulty with fine motor movements.  I discussed proceeding with an injection in his right shoulder.  Pain causes tension how much relief he gets following the injection.  He states understanding.  It is possible that we may have to complete a further workup of his cervical spine depending on the efficacy of the injection.  He states understanding.  Follow-up in 6.  Procedure note injection - Right shoulder    Verbal consent was obtained to inject the right shoulder, subacromial space Timeout was completed to confirm the site of injection.   The skin was prepped with alcohol and ethyl chloride was sprayed at the injection site.  A 21-gauge needle was used to inject 40 mg of Depo-Medrol and 1% lidocaine (3 cc) into the subacromial space of the right shoulder using a posterolateral approach.  There were no complications.  A sterile bandage was applied.     Follow-up: Return in about 6 weeks (around 01/23/2022).  Subjective:  Chief Complaint  Patient presents with   Arm Pain    Right shld and arm pain, has popeye deformity R biceps.  Painful ROM, does not have FROM.   No meds specifically for this, but takes pain meds for other problems. NKI, just woke up one morning and it was like this.  Taking meloxicam and oxycodone.     History of Present Illness: Jerry Aguirre is a 56 y.o. male who has been referred by Sharolyn Douglas, MD for evaluation of right shoulder pain.  Approximate 2 months, he states he went to bed feeling normal.  He woke up the next morning, in severe pain and noted a [name] in his right arm.  No other injuries.  He has difficulty with overhead motion.  Limited use of the right arm.  He cannot get his hand behind his back.  He also has numbness and tingling rating into his hand.  He does take meloxicam and oxycodone with limited improvement in his symptoms.  No prior injections.   Review of Systems: No fevers or chills + numbness & tingling No chest pain No shortness of breath No bowel or bladder dysfunction No GI distress No headaches   Medical History:  Past Medical History:  Diagnosis Date   COPD (chronic obstructive pulmonary disease) (HCC)    GERD (gastroesophageal reflux disease)    Headache    Hyperlipidemia    Hypertension    Sleep apnea    pt says his "OSA was mild and they said he did not need a CPAP"    Past Surgical History:  Procedure Laterality  Date   HERNIA REPAIR Right    inguinal   LUMBAR LAMINECTOMY/DECOMPRESSION MICRODISCECTOMY N/A 04/21/2021   Procedure: LAMINECTOMY, MEDIAL FACETECTOMY LUMBAR FOUR- FIVE;  Surgeon: Vallarie Mare, MD;  Location: [facility];  Service: Neurosurgery;  Laterality: N/A;   TONSILLECTOMY     removed as a child    History reviewed. No pertinent family history. Social History   Tobacco Use   Smoking status: Every Day    Packs/day: 1.00    Types: Cigarettes   Smokeless tobacco: Never   Tobacco comments:    1 PPD  Vaping Use   Vaping Use: Never used  Substance Use Topics   Alcohol use: Not Currently   Drug use: Yes    Types: Marijuana    Comment: twice a week     Allergies  Allergen Reactions   Lisinopril Itching    headaches   Lopressor [Metoprolol Tartrate]     Unknown to pt     Current Meds  Medication Sig   albuterol (VENTOLIN HFA) 108 (90 Base) MCG/ACT inhaler Inhale 2 puffs into the lungs every 4 (four) hours as needed for wheezing or shortness of breath.   aspirin EC 81 MG EC tablet Take 1 tablet (81 mg total) by mouth daily. Swallow whole.   atorvastatin (LIPITOR) 40 MG tablet Take 1 tablet (40 mg total) by mouth daily at 6 PM.   baclofen (LIORESAL) 10 MG tablet Take 1 tablet (10 mg total) by mouth 2 (two) times daily.   cetirizine (ZYRTEC) 10 MG tablet TAKE 1 TABLET BY MOUTH ONCE DAILY AS NEEDED FOR ALLERGY SYMPTOMS.   cyclobenzaprine (FLEXERIL) 10 MG tablet Take 1 tablet (10 mg total) by mouth 2 (two) times daily as needed for muscle spasms.   docusate sodium (COLACE) 100 MG capsule Take 1 capsule (100 mg total) by mouth 2 (two) times daily.   Elastic Bandages & Supports (MEDICAL COMPRESSION STOCKINGS) MISC 2 each by Does not apply route daily.   ezetimibe (ZETIA) 10 MG tablet Take 1 tablet (10 mg total) by mouth daily.   furosemide (LASIX) 40 MG tablet Take 1 tablet (40 mg total) by mouth 2 (two) times daily.   lactulose (CHRONULAC) 10 GM/15ML solution Take 10 g by mouth at bedtime.   meloxicam (MOBIC) 7.5 MG tablet Take 1 tablet (7.5 mg total) by mouth 2 (two) times daily.   metoprolol succinate (TOPROL-XL) 25 MG 24 hr tablet TAKE ONE TABLET BY MOUTH ONCE DAILY AT BEDTIME.   ondansetron (ZOFRAN) 4 MG tablet Take 4 mg by mouth 2 (two) times daily.   oxyCODONE-acetaminophen (PERCOCET) 10-325 MG tablet Take 1 tablet by mouth every 6 (six) hours as needed for pain.   pantoprazole (PROTONIX) 40 MG tablet Take 40 mg by mouth 2 (two) times daily.   polyethylene glycol-electrolytes (TRILYTE) 420 g solution Take 4,000 mLs by mouth as directed.   pramipexole (MIRAPEX) 0.75 MG tablet Take 0.75-1.5 mg by mouth See admin instructions. Take  0.75 mg in the morning and 1.5 mg at night   pregabalin (LYRICA) 150 MG capsule Take 150 mg by mouth at bedtime.   Tiotropium Bromide Monohydrate (SPIRIVA RESPIMAT) 2.5 MCG/ACT AERS 2 puffs each am   triamterene-hydrochlorothiazide (DYAZIDE) 37.5-25 MG capsule Take 1 capsule by mouth daily.   zolpidem (AMBIEN) 10 MG tablet Take 10 mg by mouth at bedtime as needed.    Objective: There were no vitals taken for this visit.  Physical Exam:  General: Alert and oriented. and No acute distress. Gait:  Ambulates with the assistance of a cane.  Right shoulder with [name] tenderness to palpation over the anterior shoulder, within the bicipital groove.  Biceps tendon is palpable distally.  He is able to flex and supinate his right forearm.  Internal rotation to the right buttock.  Forward flexion limited to 120 degrees.  Positive Jobe's.  Slightly decreased sensation throughout the right hand.  2+ radial pulse.  Mildly positive Hoffmann's bilaterally.    IMAGING: I personally reviewed images previously obtained in clinic  X-rays of the right shoulder were previously obtained.  Loss of joint space within the glenohumeral joint.  Small osteophytes are appreciated.  No evidence of proximal humeral migration.   New Medications:  Meds ordered this encounter  Medications   cyclobenzaprine (FLEXERIL) 10 MG tablet    Sig: Take 1 tablet (10 mg total) by mouth 2 (two) times daily as needed for muscle spasms.    Dispense:  20 tablet    Refill:  0      Mordecai Rasmussen, MD  12/12/2021 11:10 AM

## 2021-12-12 NOTE — Addendum Note (Signed)
Addended by: Brand Males E on: 12/12/2021 11:24 AM   Modules accepted: Orders

## 2021-12-13 ENCOUNTER — Encounter: Payer: Self-pay | Admitting: General Surgery

## 2021-12-13 ENCOUNTER — Ambulatory Visit (INDEPENDENT_AMBULATORY_CARE_PROVIDER_SITE_OTHER): Payer: Medicaid Other | Admitting: General Surgery

## 2021-12-13 ENCOUNTER — Ambulatory Visit (HOSPITAL_COMMUNITY): Admission: RE | Admit: 2021-12-13 | Payer: Medicaid Other | Source: Home / Self Care | Admitting: Gastroenterology

## 2021-12-13 ENCOUNTER — Telehealth (INDEPENDENT_AMBULATORY_CARE_PROVIDER_SITE_OTHER): Payer: Self-pay | Admitting: *Deleted

## 2021-12-13 ENCOUNTER — Encounter (HOSPITAL_COMMUNITY): Admission: RE | Payer: Self-pay | Source: Home / Self Care

## 2021-12-13 VITALS — BP 130/82 | HR 74 | Temp 97.8°F | Resp 14 | Ht 63.5 in | Wt 217.0 lb

## 2021-12-13 DIAGNOSIS — M6208 Separation of muscle (nontraumatic), other site: Secondary | ICD-10-CM | POA: Insufficient documentation

## 2021-12-13 DIAGNOSIS — R140 Abdominal distension (gaseous): Secondary | ICD-10-CM

## 2021-12-13 DIAGNOSIS — R600 Localized edema: Secondary | ICD-10-CM | POA: Diagnosis not present

## 2021-12-13 DIAGNOSIS — R0602 Shortness of breath: Secondary | ICD-10-CM

## 2021-12-13 SURGERY — COLONOSCOPY WITH PROPOFOL
Anesthesia: Monitor Anesthesia Care

## 2021-12-13 NOTE — Telephone Encounter (Signed)
Pre-op sent to pt.

## 2021-12-13 NOTE — Telephone Encounter (Signed)
Spoke with pt. He has been scheduled for procedure 12/7 at 10:15am. Reports already has his prep kit at home. Advised will send new instructions and will need pre-op appt prior to procedure. I will call back with this appt. He voiced understanding.  Mitzie, see below thanks!

## 2021-12-13 NOTE — Patient Instructions (Signed)
Will check labs including liver test, kidney test, and heart failure test to see if these are different or worse from prior labs.    Continue to keep your stools soft and regular.  Get a Colonoscopy and Endoscopy given your symptoms.

## 2021-12-13 NOTE — Telephone Encounter (Signed)
Ok just call the pt and see if that works

## 2021-12-13 NOTE — Progress Notes (Unsigned)
+[facility] History and Physical  Reason for Referral:*** Referring Physician: ***  Chief Complaint   New Patient (Initial Visit)     Drayke Woo is a 56 y.o. male.  HPI: ***.  The *** started *** and has had a duration of ***.  It is associated with ***.  The *** is improved with ***, and is made worse with ***.    Quality*** Context***  Past Medical History:  Diagnosis Date   COPD (chronic obstructive pulmonary disease) (HCC)    GERD (gastroesophageal reflux disease)    Headache    Hyperlipidemia    Hypertension    Sleep apnea    pt says his "OSA was mild and they said he did not need a CPAP"    Past Surgical History:  Procedure Laterality Date   HERNIA REPAIR Right    inguinal   LUMBAR LAMINECTOMY/DECOMPRESSION MICRODISCECTOMY N/A 04/21/2021   Procedure: LAMINECTOMY, MEDIAL FACETECTOMY LUMBAR FOUR- FIVE;  Surgeon: Vallarie Mare, MD;  Location: [facility];  Service: Neurosurgery;  Laterality: N/A;   TONSILLECTOMY     removed as a child    No family history on file.  Social History   Tobacco Use   Smoking status: Every Day    Packs/day: 1.00    Types: Cigarettes   Smokeless tobacco: Never   Tobacco comments:    1 PPD  Vaping Use   Vaping Use: Never used  Substance Use Topics   Alcohol use: Not Currently   Drug use: Yes    Types: Marijuana    Comment: twice a week    Medications: {medication reviewed/display:3041432} Allergies as of 12/13/2021       Reactions   Lisinopril Itching   headaches   Lopressor [metoprolol Tartrate]    Unknown to pt         Medication List        Accurate as of December 13, 2021 11:03 AM. If you have any questions, ask your nurse or doctor.          albuterol 108 (90 Base) MCG/ACT inhaler Commonly known as: VENTOLIN HFA Inhale 2 puffs into the lungs every 4 (four) hours as needed for wheezing or shortness of breath.   aspirin EC 81 MG tablet Take 1 tablet (81 mg total) by mouth daily.  Swallow whole.   atorvastatin 40 MG tablet Commonly known as: LIPITOR Take 1 tablet (40 mg total) by mouth daily at 6 PM.   baclofen 10 MG tablet Commonly known as: LIORESAL Take 1 tablet (10 mg total) by mouth 2 (two) times daily.   cetirizine 10 MG tablet Commonly known as: ZYRTEC TAKE 1 TABLET BY MOUTH ONCE DAILY AS NEEDED FOR ALLERGY SYMPTOMS.   cyclobenzaprine 10 MG tablet Commonly known as: FLEXERIL Take 1 tablet (10 mg total) by mouth 2 (two) times daily as needed for muscle spasms.   docusate sodium 100 MG capsule Commonly known as: Colace Take 1 capsule (100 mg total) by mouth 2 (two) times daily.   ezetimibe 10 MG tablet Commonly known as: Zetia Take 1 tablet (10 mg total) by mouth daily.   furosemide 40 MG tablet Commonly known as: LASIX Take 1 tablet (40 mg total) by mouth 2 (two) times daily.   lactulose 10 GM/15ML solution Commonly known as: CHRONULAC Take 10 g by mouth at bedtime.   Medical Compression Stockings Misc 2 each by Does not apply route daily.   meloxicam 7.5 MG tablet Commonly known as: MOBIC Take 1 tablet (  7.5 mg total) by mouth 2 (two) times daily.   metoprolol succinate 25 MG 24 hr tablet Commonly known as: TOPROL-XL TAKE ONE TABLET BY MOUTH ONCE DAILY AT BEDTIME.   ondansetron 4 MG tablet Commonly known as: ZOFRAN Take 4 mg by mouth 2 (two) times daily.   oxyCODONE-acetaminophen 10-325 MG tablet Commonly known as: Percocet Take 1 tablet by mouth every 6 (six) hours as needed for pain.   pantoprazole 40 MG tablet Commonly known as: PROTONIX Take 40 mg by mouth 2 (two) times daily.   polyethylene glycol-electrolytes 420 g solution Commonly known as: TriLyte Take 4,000 mLs by mouth as directed.   pramipexole 0.75 MG tablet Commonly known as: MIRAPEX Take 0.75-1.5 mg by mouth See admin instructions. Take 0.75 mg in the morning and 1.5 mg at night   pregabalin 150 MG capsule Commonly known as: LYRICA Take 150 mg by mouth at  bedtime.   Spiriva Respimat 2.5 MCG/ACT Aers Generic drug: Tiotropium Bromide Monohydrate 2 puffs each am   triamterene-hydrochlorothiazide 37.5-25 MG capsule Commonly known as: DYAZIDE Take 1 capsule by mouth daily.   zolpidem 10 MG tablet Commonly known as: AMBIEN Take 10 mg by mouth at bedtime as needed.         ROS:  {Review of Systems:30496}  Blood pressure 130/82, pulse 74, temperature 97.8 F (36.6 C), temperature source Oral, resp. rate 14, height 5' 3.5" (1.613 m), weight 217 lb (98.4 kg), SpO2 92 %. Physical Exam  Results: No results found for this or any previous visit (from the past 48 hour(s)).  No results found.   Assessment & Plan:  Mithcell Schumpert is a 56 y.o. male with *** -*** -*** -Follow up ***  All questions were answered to the satisfaction of the patient and family***.  The risk and benefits of *** were discussed including but not limited to ***.  After careful consideration, Tel Deskins has decided to ***.    Virl Cagey 12/13/2021, 11:03 AM

## 2021-12-13 NOTE — Telephone Encounter (Signed)
Per Dr. Jenetta Downer "[facility], can we get him scheduled for EGD and colonoscopy in next available? Dx: dysphagia and change in bowel movements. Room 3, 2 day prep for colonoscopy please.  Please also ask Mitzie to set up an appointment in next available with [name], he is a new patient"

## 2021-12-14 ENCOUNTER — Telehealth: Payer: Self-pay | Admitting: *Deleted

## 2021-12-14 ENCOUNTER — Encounter: Payer: Self-pay | Admitting: General Surgery

## 2021-12-14 LAB — COMPREHENSIVE METABOLIC PANEL
ALT: 45 IU/L — ABNORMAL HIGH (ref 0–44)
AST: 34 IU/L (ref 0–40)
Albumin/Globulin Ratio: 1.6 (ref 1.2–2.2)
Albumin: 4.6 g/dL (ref 3.8–4.9)
Alkaline Phosphatase: 102 IU/L (ref 44–121)
BUN/Creatinine Ratio: 15 (ref 9–20)
BUN: 22 mg/dL (ref 6–24)
Bilirubin Total: 0.8 mg/dL (ref 0.0–1.2)
CO2: 25 mmol/L (ref 20–29)
Calcium: 10.1 mg/dL (ref 8.7–10.2)
Chloride: 97 mmol/L (ref 96–106)
Creatinine, Ser: 1.45 mg/dL — ABNORMAL HIGH (ref 0.76–1.27)
Globulin, Total: 2.8 g/dL (ref 1.5–4.5)
Glucose: 127 mg/dL — ABNORMAL HIGH (ref 70–99)
Potassium: 4 mmol/L (ref 3.5–5.2)
Sodium: 139 mmol/L (ref 134–144)
Total Protein: 7.4 g/dL (ref 6.0–8.5)
eGFR: 56 mL/min/{1.73_m2} — ABNORMAL LOW (ref >59)

## 2021-12-14 LAB — BRAIN NATRIURETIC PEPTIDE: BNP: 8.6 pg/mL (ref 0.0–100.0)

## 2021-12-14 NOTE — Telephone Encounter (Signed)
Received faxed results of CMP and BNP.   Dr. Constance Haw reviewed labs. Advised that labs are similar to prior labs from 11/20/2021. Advised that nothing concerning is standing out at this time.   Call placed to patient and patient made aware. Verbalized understanding.

## 2021-12-15 ENCOUNTER — Encounter (INDEPENDENT_AMBULATORY_CARE_PROVIDER_SITE_OTHER): Payer: Self-pay | Admitting: Gastroenterology

## 2021-12-15 ENCOUNTER — Ambulatory Visit (INDEPENDENT_AMBULATORY_CARE_PROVIDER_SITE_OTHER): Payer: Medicaid Other | Admitting: Gastroenterology

## 2021-12-15 VITALS — BP 127/73 | HR 61 | Temp 97.8°F | Ht 63.5 in | Wt 220.7 lb

## 2021-12-15 DIAGNOSIS — R1310 Dysphagia, unspecified: Secondary | ICD-10-CM | POA: Diagnosis not present

## 2021-12-15 DIAGNOSIS — R140 Abdominal distension (gaseous): Secondary | ICD-10-CM | POA: Diagnosis not present

## 2021-12-15 DIAGNOSIS — K219 Gastro-esophageal reflux disease without esophagitis: Secondary | ICD-10-CM | POA: Diagnosis not present

## 2021-12-15 MED ORDER — OMEPRAZOLE 40 MG PO CPDR: 40.0000 mg | DELAYED_RELEASE_CAPSULE | Freq: Every day | ORAL | 3 refills | Status: DC

## 2021-12-15 NOTE — Progress Notes (Addendum)
Referring Provider: Billie Lade, MD Primary Care Physician:  Billie Lade, MD Primary GI Physician:   Chief Complaint  Patient presents with   Follow-up    ED follow up. Having swelling and abdominal pain.    HPI:   Jerry Aguirre is a 56 y.o. male with past medical history of COPD, GERD, HLD, HTN, sleep apnea   Patient presenting today for abdominal pain.  Recent ED visit in early November with swelling/abdominal distention. CT A/P done at that time was normal. CBC and CMP WNL other than slight hypokalemia of 3.3, BNP 17. HCV Ab non reactive in september  Seen by Dr. Henreitta Leber for evaluation of possible hernia, though she did not feel that hernia was not causing anything besides cosmetic issues and recommended he see GI for further evaluation.   Notably, patient is scheduled for EGD and Colonoscopy on December 7th  Present:  Patient reports that over the past three weeks he is noticing an enlargement at the top of his abdomen when lying down. He also notes that abdomen is very distended. He is gaining weight. Is on diuretics but has not noted any improvement in LE edema or abdominal distention. LE edema has been present for some time. He notes occasional gas pain but no overt abdominal pain. stools are softer with 4-5 BMs per day, previously having more stool pellets and ''stringy" stools. Denies watery stools. He denies nausea or vomiting. States appetite has not been great and he has early satiety since abdominal distention began. Has been passing some gas intermittently without any improvement in distention. He does note that stools are more of a yellowish color. Denies rectal bleeding or melena. He has noticed some issues with pills not wanting to pass recently. He denies issues with solid food dysphagia. Has been coughing more recently and notes a globus sensation. Notes with acute illness recently, protonix BID has not been controlling his symptoms he is having acid  regurgitation and heartburn almost daily. Denies any recent changes in medications or antibiotic therapy.   NSAID ZOX:WRUE  Social hx: no etoh, <1 PPD  Fam hx: no CRC or liver disease  Last Colonoscopy: previously with polyps, maybe 15 years ago  Last Endoscopy:never  Recommendations:    Past Medical History:  Diagnosis Date   COPD (chronic obstructive pulmonary disease) (HCC)    GERD (gastroesophageal reflux disease)    Headache    Hyperlipidemia    Hypertension    Sleep apnea    pt says his "OSA was mild and they said he did not need a CPAP"    Past Surgical History:  Procedure Laterality Date   HERNIA REPAIR Right    inguinal   LUMBAR LAMINECTOMY/DECOMPRESSION MICRODISCECTOMY N/A 04/21/2021   Procedure: LAMINECTOMY, MEDIAL FACETECTOMY LUMBAR FOUR- FIVE;  Surgeon: Bedelia Person, MD;  Location: [facility];  Service: Neurosurgery;  Laterality: N/A;   TONSILLECTOMY     removed as a child    Current Outpatient Medications  Medication Sig Dispense Refill   albuterol (VENTOLIN HFA) 108 (90 Base) MCG/ACT inhaler Inhale 2 puffs into the lungs every 4 (four) hours as needed for wheezing or shortness of breath. 1 each 11   aspirin EC 81 MG EC tablet Take 1 tablet (81 mg total) by mouth daily. Swallow whole. 30 tablet 11   atorvastatin (LIPITOR) 40 MG tablet Take 1 tablet (40 mg total) by mouth daily at 6 PM. 30 tablet 0   baclofen (LIORESAL) 10 MG tablet Take 1  tablet (10 mg total) by mouth 2 (two) times daily. 60 each 2   cetirizine (ZYRTEC) 10 MG tablet TAKE 1 TABLET BY MOUTH ONCE DAILY AS NEEDED FOR ALLERGY SYMPTOMS. (Patient taking differently: Take 10 mg by mouth daily.) 30 tablet 0   Chlorphen-Phenyleph-ASA (ALKA-SELTZER PLUS COLD PO) Take 2 tablets by mouth at bedtime.     cyclobenzaprine (FLEXERIL) 10 MG tablet Take 1 tablet (10 mg total) by mouth 2 (two) times daily as needed for muscle spasms. 20 tablet 0   docusate sodium (COLACE) 100 MG capsule Take 1 capsule (100 mg  total) by mouth 2 (two) times daily. 60 capsule 2   Elastic Bandages & Supports (MEDICAL COMPRESSION STOCKINGS) MISC 2 each by Does not apply route daily. 2 each 0   ezetimibe (ZETIA) 10 MG tablet Take 1 tablet (10 mg total) by mouth daily. 90 tablet 3   furosemide (LASIX) 40 MG tablet Take 1 tablet (40 mg total) by mouth 2 (two) times daily. 180 tablet 1   lactulose (CHRONULAC) 10 GM/15ML solution Take 10 g by mouth at bedtime.     meloxicam (MOBIC) 7.5 MG tablet Take 1 tablet (7.5 mg total) by mouth 2 (two) times daily. 60 tablet 3   Menthol-Zinc Oxide (GOLD BOND EX) Apply 1 Application topically daily as needed (dry skin).     metoprolol succinate (TOPROL-XL) 25 MG 24 hr tablet TAKE ONE TABLET BY MOUTH ONCE DAILY AT BEDTIME. 30 tablet 0   ondansetron (ZOFRAN) 4 MG tablet Take 4 mg by mouth 2 (two) times daily.     oxyCODONE-acetaminophen (PERCOCET) 10-325 MG tablet Take 1 tablet by mouth every 6 (six) hours as needed for pain. 20 tablet 0   pantoprazole (PROTONIX) 40 MG tablet Take 40 mg by mouth 2 (two) times daily.     polyethylene glycol-electrolytes (TRILYTE) 420 g solution Take 4,000 mLs by mouth as directed. 4000 mL 0   pramipexole (MIRAPEX) 0.75 MG tablet Take 0.75-1.5 mg by mouth See admin instructions. Take 0.75 mg in the morning and 1.5 mg at night     pregabalin (LYRICA) 150 MG capsule Take 150 mg by mouth at bedtime.     Pseudoeph-Doxylamine-DM-APAP (NYQUIL PO) Take 30 mLs by mouth at bedtime.     SYMBICORT 80-4.5 MCG/ACT inhaler Inhale 2 puffs into the lungs 2 (two) times daily.     Tiotropium Bromide Monohydrate (SPIRIVA RESPIMAT) 2.5 MCG/ACT AERS 2 puffs each am (Patient taking differently: Inhale 2 each into the lungs 2 (two) times daily.) 1 each 11   triamterene-hydrochlorothiazide (DYAZIDE) 37.5-25 MG capsule Take 1 capsule by mouth daily.     zolpidem (AMBIEN) 10 MG tablet Take 10 mg by mouth at bedtime.     No current facility-administered medications for this visit.     Allergies as of 12/15/2021 - Review Complete 12/15/2021  Allergen Reaction Noted   Lisinopril Itching 02/13/2018   Lopressor [metoprolol tartrate]  02/13/2018    Family History  Problem Relation Age of Onset   Esophageal cancer Mother    Lung cancer Father     Social History   Socioeconomic History   Marital status: Single    Spouse name: Not on file   Number of children: 2   Years of education: Not on file   Highest education level: Not on file  Occupational History   Not on file  Tobacco Use   Smoking status: Every Day    Packs/day: 1.00    Types: Cigarettes    Passive  exposure: Current   Smokeless tobacco: Never   Tobacco comments:    Less than 1 pack per day  Vaping Use   Vaping Use: Never used  Substance and Sexual Activity   Alcohol use: Not Currently   Drug use: Yes    Types: Marijuana    Comment: twice a week   Sexual activity: Not on file  Other Topics Concern   Not on file  Social History Narrative   Not on file   Social Determinants of Health   Financial Resource Strain: Not on file  Food Insecurity: Not on file  Transportation Needs: Not on file  Physical Activity: Not on file  Stress: Not on file  Social Connections: Not on file   Review of systems General: negative for malaise, night sweats, fever, chills, weight loss Neck: Negative for lumps, goiter, pain and significant neck swelling Resp: Negative for cough, wheezing, dyspnea at rest CV: Negative for chest pain, leg swelling, palpitations, orthopnea GI: denies melena, hematochezia, nausea, vomiting, diarrhea, constipation, dysphagia, odyonophagia, early satiety or unintentional weight loss.  MSK: Negative for joint pain or swelling, back pain, and muscle pain. Derm: Negative for itching or rash Psych: Denies depression, anxiety, memory loss, confusion. No homicidal or suicidal ideation.  Heme: Negative for prolonged bleeding, bruising easily, and swollen nodes. Endocrine: Negative  for cold or heat intolerance, polyuria, polydipsia and goiter. Neuro: negative for tremor, gait imbalance, syncope and seizures. The remainder of the review of systems is noncontributory.  Physical Exam: Ht 5' 3.5" (1.613 m)   Wt 220 lb 11.2 oz (100.1 kg)   BMI 38.48 kg/m  General:   Alert and oriented. No distress noted. Pleasant and cooperative.  Head:  Normocephalic and atraumatic. Eyes:  Conjuctiva clear without scleral icterus. Mouth:  Oral mucosa pink and moist. Good dentition. No lesions. Heart: Normal rate and rhythm, s1 and s2 heart sounds present.  Lungs: Clear lung sounds in all lobes. Respirations equal and unlabored. Abdomen:  +BS, soft, non-tender and non-distended. No rebound or guarding. No HSM or masses noted. Derm: No palmar erythema or jaundice Msk:  Symmetrical without gross deformities. Normal posture. Extremities:  Without edema. Neurologic:  Alert and  oriented x4 Psych:  Alert and cooperative. Normal mood and affect.  Invalid input(s): "6 MONTHS"   ASSESSMENT: Axtyn Bellido is a 56 y.o. male presenting today for abdominal distention.  Abdominal distention for the past 3 weeks, not improved with diuretics. CT imaging unremarkable other than some stool noted in the colon. LFTs and other labs all WNL. No weight loss but he is continuing to retain fluid. Stools have changed as well with softer/looser stools recently though no rectal bleeding or melena. Denies watery stools. Query if distention is related to stool burden.    PLAN:  Keep schedule EGD/Colonoscopy appt  2. Stop protonix  3. Start omeprazole 40mg  daily   All questions were answered, patient verbalized understanding and is in agreement with plan as outlined above.    Follow Up: 3 months   Louann Hopson L. Jeanmarie Hubert, MSN, APRN, AGNP-C Adult-Gerontology Nurse Practitioner [facility]

## 2021-12-15 NOTE — Patient Instructions (Signed)
Please stop protonix and start omeprazole '40mg'$  once daily Keep your scheduled colonoscopy and EGD on 12/7 for further evaluation of your symptoms  Follow up 3 months

## 2021-12-15 NOTE — H&P (View-Only) (Signed)
Referring Provider: Johnette Abraham, MD nd Dr. Curlene Labrum, MD Primary Care Physician:  Johnette Abraham, MD Primary GI Physician:   Chief Complaint  Patient presents with   Follow-up    ED follow up. Having swelling and abdominal pain.    HPI:   Jerry Aguirre is a 56 y.o. male with past medical history of COPD, GERD, HLD, HTN, sleep apnea   Patient presenting today for abdominal distention.   Recent ED visit in early November with swelling/abdominal distention. CT A/P done at that time was normal. CBC and CMP WNL other than slight hypokalemia of 3.3, BNP 17. HCV Ab non reactive in september  Seen by Dr. Constance Haw for evaluation of possible hernia, diagnosed with diastasis recti without hernia and recommended he see GI for possible EGD along with previously scheduled colonoscopy.   Notably, patient is scheduled for EGD and Colonoscopy on December 7th  Present:  Patient reports that over the past three weeks he is noticing an enlargement in his abdomen as well as more enlargement in the top of his abdomen when lying down. He also notes that abdomen is very distended. He is gaining weight. Is on diuretics but has not noted any improvement in LE edema or abdominal distention. LE edema has been present for some time. He notes occasional gas pain but no overt abdominal pain. stools are softer with 4-5 BMs per day, previously having more stool pellets and ''stringy" stools. Denies watery stools. He denies nausea or vomiting. States appetite has not been great and he has early satiety since abdominal distention began. Has been passing some gas intermittently without any improvement in distention. He does note that stools are more of a yellowish color. Denies rectal bleeding or melena. He has noticed some issues with pills not wanting to pass recently. He denies issues with solid food dysphagia. Has been coughing more recently and notes a globus sensation. With acute illness recently, protonix  BID has not been controlling his GERD symptoms he is having acid regurgitation and heartburn almost daily. Denies any recent changes in medications or antibiotic therapy.   NSAID LNL:GXQJ  Social hx: no etoh, <1 PPD  Fam hx: no CRC or liver disease  Last Colonoscopy: previously with polyps, maybe 15 years ago  Last Endoscopy:never  Recommendations:    Past Medical History:  Diagnosis Date   COPD (chronic obstructive pulmonary disease) (HCC)    GERD (gastroesophageal reflux disease)    Headache    Hyperlipidemia    Hypertension    Sleep apnea    pt says his "OSA was mild and they said he did not need a CPAP"    Past Surgical History:  Procedure Laterality Date   HERNIA REPAIR Right    inguinal   LUMBAR LAMINECTOMY/DECOMPRESSION MICRODISCECTOMY N/A 04/21/2021   Procedure: LAMINECTOMY, MEDIAL FACETECTOMY LUMBAR FOUR- FIVE;  Surgeon: Vallarie Mare, MD;  Location: [facility];  Service: Neurosurgery;  Laterality: N/A;   TONSILLECTOMY     removed as a child    Current Outpatient Medications  Medication Sig Dispense Refill   albuterol (VENTOLIN HFA) 108 (90 Base) MCG/ACT inhaler Inhale 2 puffs into the lungs every 4 (four) hours as needed for wheezing or shortness of breath. 1 each 11   aspirin EC 81 MG EC tablet Take 1 tablet (81 mg total) by mouth daily. Swallow whole. 30 tablet 11   atorvastatin (LIPITOR) 40 MG tablet Take 1 tablet (40 mg total) by mouth daily at 6 PM. 30  tablet 0   baclofen (LIORESAL) 10 MG tablet Take 1 tablet (10 mg total) by mouth 2 (two) times daily. 60 each 2   cetirizine (ZYRTEC) 10 MG tablet TAKE 1 TABLET BY MOUTH ONCE DAILY AS NEEDED FOR ALLERGY SYMPTOMS. (Patient taking differently: Take 10 mg by mouth daily.) 30 tablet 0   Chlorphen-Phenyleph-ASA (ALKA-SELTZER PLUS COLD PO) Take 2 tablets by mouth at bedtime.     cyclobenzaprine (FLEXERIL) 10 MG tablet Take 1 tablet (10 mg total) by mouth 2 (two) times daily as needed for muscle spasms. 20 tablet 0    docusate sodium (COLACE) 100 MG capsule Take 1 capsule (100 mg total) by mouth 2 (two) times daily. 60 capsule 2   Elastic Bandages & Supports (MEDICAL COMPRESSION STOCKINGS) MISC 2 each by Does not apply route daily. 2 each 0   ezetimibe (ZETIA) 10 MG tablet Take 1 tablet (10 mg total) by mouth daily. 90 tablet 3   furosemide (LASIX) 40 MG tablet Take 1 tablet (40 mg total) by mouth 2 (two) times daily. 180 tablet 1   lactulose (CHRONULAC) 10 GM/15ML solution Take 10 g by mouth at bedtime.     meloxicam (MOBIC) 7.5 MG tablet Take 1 tablet (7.5 mg total) by mouth 2 (two) times daily. 60 tablet 3   Menthol-Zinc Oxide (GOLD BOND EX) Apply 1 Application topically daily as needed (dry skin).     metoprolol succinate (TOPROL-XL) 25 MG 24 hr tablet TAKE ONE TABLET BY MOUTH ONCE DAILY AT BEDTIME. 30 tablet 0   ondansetron (ZOFRAN) 4 MG tablet Take 4 mg by mouth 2 (two) times daily.     oxyCODONE-acetaminophen (PERCOCET) 10-325 MG tablet Take 1 tablet by mouth every 6 (six) hours as needed for pain. 20 tablet 0   pantoprazole (PROTONIX) 40 MG tablet Take 40 mg by mouth 2 (two) times daily.     polyethylene glycol-electrolytes (TRILYTE) 420 g solution Take 4,000 mLs by mouth as directed. 4000 mL 0   pramipexole (MIRAPEX) 0.75 MG tablet Take 0.75-1.5 mg by mouth See admin instructions. Take 0.75 mg in the morning and 1.5 mg at night     pregabalin (LYRICA) 150 MG capsule Take 150 mg by mouth at bedtime.     Pseudoeph-Doxylamine-DM-APAP (NYQUIL PO) Take 30 mLs by mouth at bedtime.     SYMBICORT 80-4.5 MCG/ACT inhaler Inhale 2 puffs into the lungs 2 (two) times daily.     Tiotropium Bromide Monohydrate (SPIRIVA RESPIMAT) 2.5 MCG/ACT AERS 2 puffs each am (Patient taking differently: Inhale 2 each into the lungs 2 (two) times daily.) 1 each 11   triamterene-hydrochlorothiazide (DYAZIDE) 37.5-25 MG capsule Take 1 capsule by mouth daily.     zolpidem (AMBIEN) 10 MG tablet Take 10 mg by mouth at bedtime.     No  current facility-administered medications for this visit.    Allergies as of 12/15/2021 - Review Complete 12/15/2021  Allergen Reaction Noted   Lisinopril Itching 02/13/2018   Lopressor [metoprolol tartrate]  02/13/2018    Family History  Problem Relation Age of Onset   Esophageal cancer Mother    Lung cancer Father     Social History   Socioeconomic History   Marital status: Single    Spouse name: Not on file   Number of children: 2   Years of education: Not on file   Highest education level: Not on file  Occupational History   Not on file  Tobacco Use   Smoking status: Every Day  Packs/day: 1.00    Types: Cigarettes    Passive exposure: Current   Smokeless tobacco: Never   Tobacco comments:    Less than 1 pack per day  Vaping Use   Vaping Use: Never used  Substance and Sexual Activity   Alcohol use: Not Currently   Drug use: Yes    Types: Marijuana    Comment: twice a week   Sexual activity: Not on file  Other Topics Concern   Not on file  Social History Narrative   Not on file   Social Determinants of Health   Financial Resource Strain: Not on file  Food Insecurity: Not on file  Transportation Needs: Not on file  Physical Activity: Not on file  Stress: Not on file  Social Connections: Not on file   Review of systems General: negative for malaise, night sweats, fever, chills, weight loss Neck: Negative for lumps, goiter, pain and significant neck swelling Resp: Negative for cough, wheezing, dyspnea at rest CV: Negative for chest pain, leg swelling, palpitations, orthopnea GI: denies melena, hematochezia, nausea, vomiting, diarrhea, constipation, dysphagia, odyonophagia, early satiety or unintentional weight loss. +GERD symptoms +changes in stools +abdominal distention MSK: Negative for joint pain or swelling, back pain, and muscle pain. Derm: Negative for itching or rash Psych: Denies depression, anxiety, memory loss, confusion. No homicidal or  suicidal ideation.  Heme: Negative for prolonged bleeding, bruising easily, and swollen nodes. Endocrine: Negative for cold or heat intolerance, polyuria, polydipsia and goiter. Neuro: negative for tremor, gait imbalance, syncope and seizures. The remainder of the review of systems is noncontributory.  Physical Exam: Ht 5' 3.5" (1.613 m)   Wt 220 lb 11.2 oz (100.1 kg)   BMI 38.48 kg/m  General:   Alert and oriented. No distress noted. Pleasant and cooperative.  Head:  Normocephalic and atraumatic. Eyes:  Conjuctiva clear without scleral icterus. Mouth:  Oral mucosa pink and moist. Good dentition. No lesions. Heart: Normal rate and rhythm, s1 and s2 heart sounds present.  Lungs: Clear lung sounds in all lobes. Respirations equal and unlabored. Abdomen:  +BS, soft, non-tender. No rebound or guarding. No HSM or masses noted. +abdominal distention, diastasis recti Derm: No palmar erythema or jaundice Msk:  Symmetrical without gross deformities. Normal posture. Extremities:  Without edema. Neurologic:  Alert and  oriented x4 Psych:  Alert and cooperative. Normal mood and affect.  Invalid input(s): "6 MONTHS"   ASSESSMENT: Jerry Aguirre is a 56 y.o. male presenting today for abdominal distention.  Abdominal distention for the past 3 weeks, not improved with diuretics. CT imaging unremarkable other than some stool noted in the colon. LFTs and other labs all WNL. No weight loss, he is continuing to retain fluid in his legs. Stools have changed as well with softer/looser stools that are yellow in color, recently though no rectal bleeding or melena. No evidence to suggest abdominal distention is related to his liver as LFTs have remained WNL and there was No obvious evidence of cirrhosis or concerning liver lesions, only presence of calcified hepatic granuloma on CT A/P on 11/5. Also with some globus sensation, worsening GERD symptoms and dysphagia as well. Recommend stopping protonix and  starting omeprazole 28m daily. Query if distention is related to stool burden noted on CT. Cannot rule out presence of malignancy given his myriad of symptoms. Dysphagia could be related to esophageal stricture, stenosis, web or ring. He is already scheduled for Colonoscopy and EGD which he needs to proceed with as etiology of his symptoms is  unclear at this time.     PLAN:  Keep scheduled EGD/Colonoscopy  2. Stop protonix  3. Start omeprazole 77m daily  4. Reflux and chewing precautions.   All questions were answered, patient verbalized understanding and is in agreement with plan as outlined above.    Follow Up: 3 months   Jovonda Selner L. CAlver Sorrow MSN, APRN, AGNP-C Adult-Gerontology Nurse Practitioner [facility] I have reviewed the note and agree with the APP's assessment as described in this progress note  DMaylon Peppers MD Gastroenterology and Hepatology [facility]

## 2021-12-19 ENCOUNTER — Encounter (HOSPITAL_COMMUNITY)
Admission: RE | Admit: 2021-12-19 | Discharge: 2021-12-19 | Disposition: A | Discharge status: Home / Self Care | Payer: Medicaid Other | Source: Ambulatory Visit | Attending: Gastroenterology | Admitting: Gastroenterology

## 2021-12-19 HISTORY — DX: Dyspnea, unspecified: R06.00

## 2021-12-20 ENCOUNTER — Telehealth: Payer: Self-pay | Admitting: Acute Care

## 2021-12-20 ENCOUNTER — Encounter: Payer: Medicaid Other | Admitting: Acute Care

## 2021-12-20 ENCOUNTER — Other Ambulatory Visit: Payer: Self-pay | Admitting: Orthopedic Surgery

## 2021-12-20 NOTE — Telephone Encounter (Signed)
Patient was a no-show for his 10:30 AM shared decision making visit prior to his December 21, 2021 CT scan at [facility].  I called at 1037 and again at 1045 there was no answer.  I left a HIPAA compliant message on the patient's phone explaining that we will have to cancel the CT and reschedule the shared decision-making visit to a later date and time. Denise please mark this patient as a no-show and reach out to him to get him rescheduled. Thanks so much

## 2021-12-20 NOTE — Telephone Encounter (Signed)
SDMV marked as no show and LDCT cancelled.  Received a VM from pt asking [name] to call his sister to reschedule his appts due to him having problems with his memory. Pt asked [name] to call Margarito Courser at [phone] [facility]). Called and left VM for her to call back to reschedule appts.

## 2021-12-21 ENCOUNTER — Telehealth: Payer: Self-pay

## 2021-12-21 ENCOUNTER — Encounter (HOSPITAL_COMMUNITY): Payer: Self-pay

## 2021-12-21 ENCOUNTER — Ambulatory Visit (INDEPENDENT_AMBULATORY_CARE_PROVIDER_SITE_OTHER): Payer: Medicaid Other | Admitting: Internal Medicine

## 2021-12-21 ENCOUNTER — Encounter: Payer: Self-pay | Admitting: Internal Medicine

## 2021-12-21 ENCOUNTER — Encounter (HOSPITAL_COMMUNITY)
Admission: RE | Admit: 2021-12-21 | Discharge: 2021-12-21 | Disposition: A | Discharge status: Home / Self Care | Payer: Medicaid Other | Source: Ambulatory Visit | Attending: Gastroenterology | Admitting: Gastroenterology

## 2021-12-21 ENCOUNTER — Ambulatory Visit (HOSPITAL_COMMUNITY): Payer: Medicaid Other

## 2021-12-21 VITALS — BP 115/74 | HR 98 | Ht 63.5 in | Wt 219.4 lb

## 2021-12-21 DIAGNOSIS — R140 Abdominal distension (gaseous): Secondary | ICD-10-CM

## 2021-12-21 DIAGNOSIS — Z1331 Encounter for screening for depression: Secondary | ICD-10-CM

## 2021-12-21 DIAGNOSIS — M25511 Pain in right shoulder: Secondary | ICD-10-CM | POA: Diagnosis not present

## 2021-12-21 DIAGNOSIS — G8929 Other chronic pain: Secondary | ICD-10-CM | POA: Diagnosis not present

## 2021-12-21 DIAGNOSIS — J4489 Other specified chronic obstructive pulmonary disease: Secondary | ICD-10-CM

## 2021-12-21 NOTE — Telephone Encounter (Signed)
Just have them send [name] what they want and I will make it fit the details in her case so it's valid

## 2021-12-21 NOTE — Telephone Encounter (Signed)
Patients sister called office and states [facility] needs another form from [name] and they never heard back.   Called Lisa at [employer] and she states they do not need a form and that a paper from insurance was faxed over explaining what they needed. Verified for Lattie Haw that I did not see anything from them after the last form was faxed and she states she will refax. She states patient needs an ONO ordered to be able to keep oxyegn and patients sister has told her that patient stops breathing in his sleep and  patients sister thinks he may need a sleep study.   Dr. Melvyn Novas can we order a ONO on room air for patient?  Also, would it be okay to have patient set up for a consult with one of the sleep doctors?   Please advise, Thanks!

## 2021-12-21 NOTE — Progress Notes (Signed)
Established Patient Office Visit  Subjective   Patient ID: Jerry Aguirre, male    DOB: 03-Feb-1965  Age: 56 y.o. MRN: [id number]  Chief Complaint  Patient presents with   Follow-up   Jerry Aguirre returns to care today for 1 month follow-up.  He was last seen by me on 11/8 at which time he continued to endorse bilateral lower extremity edema and expressed concern for a ventral hernia.  His sister reported that he had an appointment scheduled with general surgery for 11/28.  A referral was also placed to orthopedic surgery due to persistent right shoulder pain with degenerative changes noted on previous imaging.  1 month follow-up was arranged.  In the interim, he was evaluated by orthopedic surgery on 11/27 and received a subacromial steroid injection.  6-week follow-up has been arranged.  He was also evaluated by general surgery on 11/28.  No hernia was identified but he was found to have diastasis recti.  Dr. Constance Haw communicated with myself and Dr. Jenetta Downer (GI), who has scheduled EGD/colonoscopy for Jerry Aguirre tomorrow (12/7).  Jerry Aguirre reports that his right shoulder pain did not improve with recent injection.  His additional symptoms are largely unchanged.  He reports that he will be undergoing a sleep evaluation over the next 2 weeks with pulmonology.  He has no additional concerns to discuss today.  Past Medical History:  Diagnosis Date   COPD (chronic obstructive pulmonary disease) (HCC)    Dyspnea    GERD (gastroesophageal reflux disease)    Headache    Hyperlipidemia    Hypertension    Sleep apnea    pt says his "OSA was mild and they said he did not need a CPAP"   Past Surgical History:  Procedure Laterality Date   HERNIA REPAIR Right    inguinal   LUMBAR LAMINECTOMY/DECOMPRESSION MICRODISCECTOMY N/A 04/21/2021   Procedure: LAMINECTOMY, MEDIAL FACETECTOMY LUMBAR FOUR- FIVE;  Surgeon: Vallarie Mare, MD;  Location: [facility];  Service: Neurosurgery;  Laterality: N/A;    TONSILLECTOMY     removed as a child   Social History   Tobacco Use   Smoking status: Every Day    Packs/day: 1.00    Types: Cigarettes    Passive exposure: Current   Smokeless tobacco: Never   Tobacco comments:    Less than 1 pack per day  Vaping Use   Vaping Use: Never used  Substance Use Topics   Alcohol use: Not Currently   Drug use: Yes    Types: Marijuana    Comment: twice a week   Family History  Problem Relation Age of Onset   Esophageal cancer Mother    Lung cancer Father    Allergies  Allergen Reactions   Lisinopril Itching    headaches   Lopressor [Metoprolol Tartrate]     Upset stomach and headaches    Review of Systems  Respiratory:  Positive for wheezing.   Cardiovascular:  Positive for leg swelling.  Gastrointestinal:        Distention  Musculoskeletal:  Positive for joint pain (right shoulder).     Objective:     BP 115/74   Pulse 98   Ht 5' 3.5" (1.613 m)   Wt 219 lb 6.4 oz (99.5 kg)   SpO2 (!) 88%   BMI 38.26 kg/m  BP Readings from Last 3 Encounters:  12/22/21 95/62  12/21/21 115/74  12/21/21 112/75   Physical Exam Vitals reviewed.  Constitutional:      General: He is  not in acute distress.    Appearance: Normal appearance. He is obese. He is not ill-appearing.  HENT:     Head: Normocephalic and atraumatic.     Right Ear: External ear normal.     Left Ear: External ear normal.     Nose: Nose normal. No congestion or rhinorrhea.     Mouth/Throat:     Mouth: Mucous membranes are moist.     Pharynx: Oropharynx is clear.  Eyes:     Extraocular Movements: Extraocular movements intact.     Conjunctiva/sclera: Conjunctivae normal.     Pupils: Pupils are equal, round, and reactive to light.  Cardiovascular:     Rate and Rhythm: Normal rate and regular rhythm.     Pulses: Normal pulses.     Heart sounds: Normal heart sounds. No murmur heard. Pulmonary:     Effort: Pulmonary effort is normal.     Breath sounds: Wheezing (diffuse,  bilateral) present. No rhonchi or rales.  Abdominal:     General: Bowel sounds are normal. There is distension.     Tenderness: There is no abdominal tenderness.  Musculoskeletal:        General: No swelling or deformity. Normal range of motion.     Cervical back: Normal range of motion.     Right lower leg: Edema (2+) present.     Left lower leg: Edema (2+) present.  Skin:    General: Skin is warm and dry.     Capillary Refill: Capillary refill takes less than 2 seconds.  Neurological:     General: No focal deficit present.     Mental Status: He is alert and oriented to person, place, and time.     Motor: No weakness.  Psychiatric:        Mood and Affect: Mood normal.        Behavior: Behavior normal.        Thought Content: Thought content normal.    Last CBC Lab Results  Component Value Date   WBC 8.8 11/20/2021   HGB 14.5 11/20/2021   HCT 43.7 11/20/2021   MCV 90.5 11/20/2021   MCH 30.0 11/20/2021   RDW 13.9 11/20/2021   PLT 292 62/03/5595   Last metabolic panel Lab Results  Component Value Date   GLUCOSE 127 (H) 12/13/2021   NA 139 12/13/2021   K 4.0 12/13/2021   CL 97 12/13/2021   CO2 25 12/13/2021   BUN 22 12/13/2021   CREATININE 1.45 (H) 12/13/2021   EGFR 56 (L) 12/13/2021   CALCIUM 10.1 12/13/2021   PROT 7.4 12/13/2021   ALBUMIN 4.6 12/13/2021   LABGLOB 2.8 12/13/2021   AGRATIO 1.6 12/13/2021   BILITOT 0.8 12/13/2021   ALKPHOS 102 12/13/2021   AST 34 12/13/2021   ALT 45 (H) 12/13/2021   ANIONGAP 8 11/20/2021   Last lipids Lab Results  Component Value Date   CHOL 199 09/22/2021   HDL 42 09/22/2021   LDLCALC 130 (H) 09/22/2021   TRIG 153 (H) 09/22/2021   CHOLHDL 4.7 09/22/2021   Last hemoglobin A1c Lab Results  Component Value Date   HGBA1C 6.2 (H) 09/22/2021   Last thyroid functions Lab Results  Component Value Date   TSH 1.410 09/13/2021   Last vitamin D Lab Results  Component Value Date   VD25OH 25.4 (L) 09/22/2021   Last  vitamin B12 and Folate Lab Results  Component Value Date   VITAMINB12 272 09/22/2021   FOLATE >20.0 09/22/2021     Assessment &  Plan:   Problem List Items Addressed This Visit       Right shoulder pain    AC joint degenerative changes noted on imaging from 11/6.  He was referred to orthopedic surgery received subacromial steroid injection recently, however he reports that his pain has not improved today.  He will follow-up in 6 weeks for reevaluation.      Abdominal distention - Primary    Persistent symptoms without clear etiology.  He was recently evaluated by general surgery and found to have diastasis recti, but this would not fully explain his symptoms.  No evidence of hernia identified.  He is scheduled to undergo EGD/colonoscopy with GI tomorrow (12/6).  We will tentatively plan for follow-up in 1 month to review results and see where things stand at that time.      Positive screening for depression on 9-item Patient Health Questionnaire (PHQ-9)    His PHQ-9 score is elevated today (13).  Denies SI/HI.  He attributes his current symptoms to his persisting medical conditions without clear etiology.  He has declined starting a medication or a referral to psychiatry today.  We will follow-up in 1 month.       Return in about 1 month (around 01/21/2022).    Johnette Abraham, MD

## 2021-12-21 NOTE — Patient Instructions (Signed)
It was a pleasure to see you today.  Thank you for giving [name] the opportunity to be involved in your care.  Below is a brief recap of your visit and next steps.  We will plan to see you again in 1 month.  Summary No medication changes today EGD/Colonoscopy scheduled for tomorrow We will follow up in 1 month to see where things stand

## 2021-12-21 NOTE — Telephone Encounter (Signed)
Yes and yes to both

## 2021-12-21 NOTE — Telephone Encounter (Signed)
Order placed.   Paper from Lattie Haw also states:   "Also need to state that there hasn't been a break in his oxygen need after 11/15/2020. *(He has not had a break in need, he just changed insurance carriers from [facility] to [facility] [employer])*"   Dr. Melvyn Novas please advise, can you create a note with this on it?

## 2021-12-21 NOTE — Telephone Encounter (Signed)
Called and s/w patients sister Margarito Courser. Explained plan to do ONO and also scheduled Sleep consult with Dr. Elsworth Soho next available in [facility] office for patient. Nothing further needed for now.

## 2021-12-21 NOTE — Patient Instructions (Signed)
Jerry Aguirre  12/21/2021     '@[email or web address]'$ @   Your procedure is scheduled on 12/22/2021.  Report to [facility] at 8:30 A.M.  Call this number if you have problems the morning of surgery:  [phone]  If you experience any cold or flu symptoms such as cough, fever, chills, shortness of breath, etc. between now and your scheduled surgery, please notify us at the above number.   Remember:   Please Follow the Diet and Prep Instructions given to you by Dr Colman Cater office.     Take these medicines the morning of surgery with A SIP OF WATER : Baclofen, Zyrtec, Flexeril, Mobic, Metoprolol, Omeprazole, Zofran, Oxycodone and Mirapex    Do not wear jewelry, make-up or nail polish.  Do not wear lotions, powders, or perfumes, or deodorant.  Do not shave 48 hours prior to surgery.  Men may shave face and neck.  Do not bring valuables to the hospital.  [facility] is not responsible for any belongings or valuables.  Contacts, dentures or bridgework may not be worn into surgery.  Leave your suitcase in the car.  After surgery it may be brought to your room.  For patients admitted to the hospital, discharge time will be determined by your treatment team.  Patients discharged the day of surgery will not be allowed to drive home.   Name and phone number of your driver:   Family Special instructions:  N/A  Please read over the following fact sheets that you were given. Care and Recovery After Surgery  Colonoscopy, Adult A colonoscopy is a procedure to look at the entire large intestine. This procedure is done using a long, thin, flexible tube that has a camera on the end. You may have a colonoscopy: As a part of normal colorectal screening. If you have certain symptoms, such as: A low number of red blood cells in your blood (anemia). Diarrhea that does not go away. Pain in your abdomen. Blood in your stool. A colonoscopy can help screen for and diagnose medical problems,  including: An abnormal growth of cells or tissue (tumor). Abnormal growths within the lining of your intestine (polyps). Inflammation. Areas of bleeding. Tell your health care provider about: Any allergies you have. All medicines you are taking, including vitamins, herbs, eye drops, creams, and over-the-counter medicines. Any problems you or family members have had with anesthetic medicines. Any bleeding problems you have. Any surgeries you have had. Any medical conditions you have. Any problems you have had with having bowel movements. Whether you are pregnant or may be pregnant. What are the risks? Generally, this is a safe procedure. However, problems may occur, including: Bleeding. Damage to your intestine. Allergic reactions to medicines given during the procedure. Infection. This is rare. What happens before the procedure? Eating and drinking restrictions Follow instructions from your health care provider about eating or drinking restrictions, which may include: A few days before the procedure: Follow a low-fiber diet. Avoid nuts, seeds, dried fruit, raw fruits, and vegetables. 1-3 days before the procedure: Eat only gelatin dessert or ice pops. Drink only clear liquids, such as water, clear juice, clear broth or bouillon, black coffee or tea, or clear soft drinks or sports drinks. Avoid liquids that contain red or purple dye. The day of the procedure: Do not eat solid foods. You may continue to drink clear liquids until up to 2 hours before the procedure. Do not eat or drink anything starting 2 hours before the procedure, or within the  time period that your health care provider recommends. Bowel prep If you were prescribed a bowel prep to take by mouth (orally) to clean out your colon: Take it as told by your health care provider. Starting the day before your procedure, you will need to drink a large amount of liquid medicine. The liquid will cause you to have many bowel  movements of loose stool until your stool becomes almost clear or light green. If your skin or the opening between the buttocks (anus) gets irritated from diarrhea, you may relieve the irritation using: Wipes with medicine in them, such as adult wet wipes with aloe and vitamin E. A product to soothe skin, such as petroleum jelly. If you vomit while drinking the bowel prep: Take a break for up to 60 minutes. Begin the bowel prep again. Call your health care provider if you keep vomiting or you cannot take the bowel prep without vomiting. To clean out your colon, you may also be given: Laxative medicines. These help you have a bowel movement. Instructions for enema use. An enema is liquid medicine injected into your rectum. Medicines Ask your health care provider about: Changing or stopping your regular medicines or supplements. This is especially important if you are taking iron supplements, diabetes medicines, or blood thinners. Taking medicines such as aspirin and ibuprofen. These medicines can thin your blood. Do not take these medicines unless your health care provider tells you to take them. Taking over-the-counter medicines, vitamins, herbs, and supplements. General instructions Ask your health care provider what steps will be taken to help prevent infection. These may include washing skin with a germ-killing soap. If you will be going home right after the procedure, plan to have a responsible adult: Take you home from the hospital or clinic. You will not be allowed to drive. Care for you for the time you are told. What happens during the procedure?  An IV will be inserted into one of your veins. You will be given a medicine to make you fall asleep (general anesthetic). You will lie on your side with your knees bent. A lubricant will be put on the tube. Then the tube will be: Inserted into your anus. Gently eased through all parts of your large intestine. Air will be sent into your  colon to keep it open. This may cause some pressure or cramping. Images will be taken with the camera and will appear on a screen. A small tissue sample may be removed to be looked at under a microscope (biopsy). The tissue may be sent to a lab for testing if any signs of problems are found. If small polyps are found, they may be removed and checked for cancer cells. When the procedure is finished, the tube will be removed. The procedure may vary among health care providers and hospitals. What happens after the procedure? Your blood pressure, heart rate, breathing rate, and blood oxygen level will be monitored until you leave the hospital or clinic. You may have a small amount of blood in your stool. You may pass gas and have mild cramping or bloating in your abdomen. This is caused by the air that was used to open your colon during the exam. If you were given a sedative during the procedure, it can affect you for several hours. Do not drive or operate machinery until your health care provider says that it is safe. It is up to you to get the results of your procedure. Ask your health care provider, or  the department that is doing the procedure, when your results will be ready. Summary A colonoscopy is a procedure to look at the entire large intestine. Follow instructions from your health care provider about eating and drinking before the procedure. If you were prescribed an oral bowel prep to clean out your colon, take it as told by your health care provider. During the colonoscopy, a flexible tube with a camera on its end is inserted into the anus and then passed into all parts of the large intestine. This information is not intended to replace advice given to you by your health care provider. Make sure you discuss any questions you have with your health care provider. Document Revised: 12/27/2020 Document Reviewed: 08/25/2020 Elsevier Patient Education  [place] Anesthesia refers to the techniques, procedures, and medicines that help a person stay safe and comfortable during surgery. Monitored anesthesia care, or sedation, is one type of anesthesia. You may have sedation if you do not need to be asleep for your procedure. Procedures that use sedation may include: Surgery to remove cataracts from your eyes. A dental procedure. A biopsy. This is when a tissue sample is removed and looked at under a microscope. You will be watched closely during your procedure. Your level of sedation or type of anesthesia may be changed to fit your needs. Tell a health care provider about: Any allergies you have. All medicines you are taking, including vitamins, herbs, eye drops, creams, and over-the-counter medicines. Any problems you or family members have had with anesthesia. Any bleeding problems you have. Any surgeries you have had. Any medical conditions or illnesses you have. This includes sleep apnea, cough, fever, or the flu. Whether you are pregnant or may be pregnant. Whether you use cigarettes, alcohol, or drugs. Any use of steroids, whether by mouth or as a cream. What are the risks? Your health care provider will talk with you about risks. These may include: Getting too much medicine (oversedation). Nausea. Allergic reactions to medicines. Trouble breathing. If this happens, a breathing tube may be used to help you breathe. It will be removed when you are awake and breathing on your own. Heart trouble. Lung trouble. Confusion that gets better with time (emergence delirium). What happens before the procedure? When to stop eating and drinking Follow instructions from your health care provider about what you may eat and drink. These may include: 8 hours before your procedure Stop eating most foods. Do not eat meat, fried foods, or fatty foods. Eat only light foods, such as toast or crackers. All liquids are okay except energy drinks and  alcohol. 6 hours before your procedure Stop eating. Drink only clear liquids, such as water, clear fruit juice, black coffee, plain tea, and sports drinks. Do not drink energy drinks or alcohol. 2 hours before your procedure Stop drinking all liquids. You may be allowed to take medicines with small sips of water. If you do not follow your health care provider's instructions, your procedure may be delayed or canceled. Medicines Ask your health care provider about: Changing or stopping your regular medicines. These include any diabetes medicines or blood thinners you take. Taking medicines such as aspirin and ibuprofen. These medicines can thin your blood. Do not take them unless your health care provider tells you to. Taking over-the-counter medicines, vitamins, herbs, and supplements. Testing You may have an exam or testing. You may have a blood or urine sample taken. General instructions Do not use any products  that contain nicotine or tobacco for at least 4 weeks before the procedure. These products include cigarettes, chewing tobacco, and vaping devices, such as e-cigarettes. If you need help quitting, ask your health care provider. If you will be going home right after the procedure, plan to have a responsible adult: Take you home from the hospital or clinic. You will not be allowed to drive. Care for you for the time you are told. What happens during the procedure?  Your blood pressure, heart rate, breathing, level of pain, and blood oxygen level will be monitored. An IV will be inserted into one of your veins. You may be given: A sedative. This helps you relax. Anesthesia. This will: Numb certain areas of your body. Make you fall asleep for surgery. You will be given medicines as needed to keep you comfortable. The more medicine you are given, the deeper your level of sedation will be. Your level of sedation may be changed to fit your needs. There are three levels of  sedation: Mild sedation. At this level, you may feel awake and relaxed. You will be able to follow directions. Moderate sedation. At this level, you will be sleepy. You may not remember the procedure. Deep sedation. At this level, you will be asleep. You will not remember the procedure. How you get the medicines will depend on your age and the procedure. They may be given as: A pill. This may be taken by mouth (orally) or inserted into the rectum. An injection. This may be into a vein or muscle. A spray through the nose. After your procedure is over, the medicine will be stopped. The procedure may vary among health care providers and hospitals. What happens after the procedure? Your blood pressure, heart rate, breathing rate, and blood oxygen level will be monitored until you leave the hospital or clinic. You may feel sleepy, clumsy, or nauseous. You may not remember what happened during or after the procedure. Sedation can affect you for several hours. Do not drive or use machinery until your health care provider says that it is safe. This information is not intended to replace advice given to you by your health care provider. Make sure you discuss any questions you have with your health care provider. Document Revised: 05/30/2021 Document Reviewed: 05/30/2021 Elsevier Patient Education  [place]. Colonoscopy, Adult A colonoscopy is a procedure to look at the entire large intestine. This procedure is done using a long, thin, flexible tube that has a camera on the end. You may have a colonoscopy: As a part of normal colorectal screening. If you have certain symptoms, such as: A low number of red blood cells in your blood (anemia). Diarrhea that does not go away. Pain in your abdomen. Blood in your stool. A colonoscopy can help screen for and diagnose medical problems, including: An abnormal growth of cells or tissue (tumor). Abnormal growths within the lining of your intestine  (polyps). Inflammation. Areas of bleeding. Tell your health care provider about: Any allergies you have. All medicines you are taking, including vitamins, herbs, eye drops, creams, and over-the-counter medicines. Any problems you or family members have had with anesthetic medicines. Any bleeding problems you have. Any surgeries you have had. Any medical conditions you have. Any problems you have had with having bowel movements. Whether you are pregnant or may be pregnant. What are the risks? Generally, this is a safe procedure. However, problems may occur, including: Bleeding. Damage to your intestine. Allergic reactions to medicines given during  the procedure. Infection. This is rare. What happens before the procedure? Eating and drinking restrictions Follow instructions from your health care provider about eating or drinking restrictions, which may include: A few days before the procedure: Follow a low-fiber diet. Avoid nuts, seeds, dried fruit, raw fruits, and vegetables. 1-3 days before the procedure: Eat only gelatin dessert or ice pops. Drink only clear liquids, such as water, clear juice, clear broth or bouillon, black coffee or tea, or clear soft drinks or sports drinks. Avoid liquids that contain red or purple dye. The day of the procedure: Do not eat solid foods. You may continue to drink clear liquids until up to 2 hours before the procedure. Do not eat or drink anything starting 2 hours before the procedure, or within the time period that your health care provider recommends. Bowel prep If you were prescribed a bowel prep to take by mouth (orally) to clean out your colon: Take it as told by your health care provider. Starting the day before your procedure, you will need to drink a large amount of liquid medicine. The liquid will cause you to have many bowel movements of loose stool until your stool becomes almost clear or light green. If your skin or the opening between  the buttocks (anus) gets irritated from diarrhea, you may relieve the irritation using: Wipes with medicine in them, such as adult wet wipes with aloe and vitamin E. A product to soothe skin, such as petroleum jelly. If you vomit while drinking the bowel prep: Take a break for up to 60 minutes. Begin the bowel prep again. Call your health care provider if you keep vomiting or you cannot take the bowel prep without vomiting. To clean out your colon, you may also be given: Laxative medicines. These help you have a bowel movement. Instructions for enema use. An enema is liquid medicine injected into your rectum. Medicines Ask your health care provider about: Changing or stopping your regular medicines or supplements. This is especially important if you are taking iron supplements, diabetes medicines, or blood thinners. Taking medicines such as aspirin and ibuprofen. These medicines can thin your blood. Do not take these medicines unless your health care provider tells you to take them. Taking over-the-counter medicines, vitamins, herbs, and supplements. General instructions Ask your health care provider what steps will be taken to help prevent infection. These may include washing skin with a germ-killing soap. If you will be going home right after the procedure, plan to have a responsible adult: Take you home from the hospital or clinic. You will not be allowed to drive. Care for you for the time you are told. What happens during the procedure?  An IV will be inserted into one of your veins. You will be given a medicine to make you fall asleep (general anesthetic). You will lie on your side with your knees bent. A lubricant will be put on the tube. Then the tube will be: Inserted into your anus. Gently eased through all parts of your large intestine. Air will be sent into your colon to keep it open. This may cause some pressure or cramping. Images will be taken with the camera and will  appear on a screen. A small tissue sample may be removed to be looked at under a microscope (biopsy). The tissue may be sent to a lab for testing if any signs of problems are found. If small polyps are found, they may be removed and checked for cancer cells. When the procedure  is finished, the tube will be removed. The procedure may vary among health care providers and hospitals. What happens after the procedure? Your blood pressure, heart rate, breathing rate, and blood oxygen level will be monitored until you leave the hospital or clinic. You may have a small amount of blood in your stool. You may pass gas and have mild cramping or bloating in your abdomen. This is caused by the air that was used to open your colon during the exam. If you were given a sedative during the procedure, it can affect you for several hours. Do not drive or operate machinery until your health care provider says that it is safe. It is up to you to get the results of your procedure. Ask your health care provider, or the department that is doing the procedure, when your results will be ready. Summary A colonoscopy is a procedure to look at the entire large intestine. Follow instructions from your health care provider about eating and drinking before the procedure. If you were prescribed an oral bowel prep to clean out your colon, take it as told by your health care provider. During the colonoscopy, a flexible tube with a camera on its end is inserted into the anus and then passed into all parts of the large intestine. This information is not intended to replace advice given to you by your health care provider. Make sure you discuss any questions you have with your health care provider. Document Revised: 12/27/2020 Document Reviewed: 08/25/2020 Elsevier Patient Education  [place] Endoscopy, Adult Upper endoscopy is a procedure to look inside the upper GI (gastrointestinal) tract. The upper GI tract is  made up of: The esophagus. This is the part of the body that moves food from your mouth to your stomach. The stomach. The duodenum. This is the first part of your small intestine. This procedure is also called esophagogastroduodenoscopy (EGD) or gastroscopy. In this procedure, your health care provider passes a thin, flexible tube (endoscope) through your mouth and down your esophagus into your stomach and into your duodenum. A small camera is attached to the end of the tube. Images from the camera appear on a monitor in the exam room. During this procedure, your health care provider may also remove a small piece of tissue to be sent to a lab and examined under a microscope (biopsy). Your health care provider may do an upper endoscopy to diagnose cancers of the upper GI tract. You may also have this procedure to find the cause of other conditions, such as: Stomach pain. Heartburn. Pain or problems when swallowing. Nausea and vomiting. Stomach bleeding. Stomach ulcers. Tell a health care provider about: Any allergies you have. All medicines you are taking, including vitamins, herbs, eye drops, creams, and over-the-counter medicines. Any problems you or family members have had with anesthetic medicines. Any bleeding problems you have. Any surgeries you have had. Any medical conditions you have. Whether you are pregnant or may be pregnant. What are the risks? Your healthcare provider will talk with you about risks. These may include: Infection. Bleeding. Allergic reactions to medicines. A tear or hole (perforation) in the esophagus, stomach, or duodenum. What happens before the procedure? When to stop eating and drinking Follow instructions from your health care provider about what you may eat and drink. These may include: 8 hours before your procedure Stop eating most foods. Do not eat meat, fried foods, or fatty foods. Eat only light foods, such as toast or crackers. All  liquids are  okay except energy drinks and alcohol. 6 hours before your procedure Stop eating. Drink only clear liquids, such as water, clear fruit juice, black coffee, plain tea, and sports drinks. Do not drink energy drinks or alcohol. 2 hours before your procedure Stop drinking all liquids. You may be allowed to take medicines with small sips of water. If you do not follow your health care provider's instructions, your procedure may be delayed or canceled. Medicines Ask your health care provider about: Changing or stopping your regular medicines. This is especially important if you are taking diabetes medicines or blood thinners. Taking medicines such as aspirin and ibuprofen. These medicines can thin your blood. Do not take these medicines unless your health care provider tells you to take them. Taking over-the-counter medicines, vitamins, herbs, and supplements. General instructions If you will be going home right after the procedure, plan to have a responsible adult: Take you home from the hospital or clinic. You will not be allowed to drive. Care for you for the time you are told. What happens during the procedure?  An IV will be inserted into one of your veins. You may be given one or more of the following: A medicine to help you relax (sedative). A medicine to numb the throat (local anesthetic). You will lie on your left side on an exam table. Your health care provider will pass the endoscope through your mouth and down your esophagus. Your health care provider will use the scope to check the inside of your esophagus, stomach, and duodenum. Biopsies may be taken. The endoscope will be removed. The procedure may vary among health care providers and hospitals. What happens after the procedure? Your blood pressure, heart rate, breathing rate, and blood oxygen level will be monitored until you leave the hospital or clinic. When your throat is no longer numb, you may be given some fluids to  drink. If you were given a sedative during the procedure, it can affect you for several hours. Do not drive or operate machinery until your health care provider says that it is safe. It is up to you to get the results of your procedure. Ask your health care provider, or the department that is doing the procedure, when your results will be ready. Contact a health care provider if you: Have a sore throat that lasts longer than 1 day. Have a fever. Get help right away if you: Vomit blood or your vomit looks like coffee grounds. Have bloody, black, or tarry stools. Have a very bad sore throat or you cannot swallow. Have difficulty breathing or very bad pain in your chest or abdomen. These symptoms may be an emergency. Get help right away. Call 911. Do not wait to see if the symptoms will go away. Do not drive yourself to the hospital. Summary Upper endoscopy is a procedure to look inside the upper GI tract. During the procedure, an IV will be inserted into one of your veins. You may be given a medicine to help you relax. The endoscope will be passed through your mouth and down your esophagus. Follow instructions from your health care provider about what you can eat and drink. This information is not intended to replace advice given to you by your health care provider. Make sure you discuss any questions you have with your health care provider. Document Revised: 04/13/2021 Document Reviewed: 04/13/2021 Elsevier Patient Education  [place].

## 2021-12-21 NOTE — Telephone Encounter (Signed)
Will place in review folder

## 2021-12-22 ENCOUNTER — Encounter (HOSPITAL_COMMUNITY)
Admission: RE | Disposition: A | Discharge status: Home / Self Care | Payer: Self-pay | Source: Ambulatory Visit | Attending: Gastroenterology

## 2021-12-22 ENCOUNTER — Encounter (HOSPITAL_COMMUNITY): Payer: Self-pay | Admitting: Gastroenterology

## 2021-12-22 ENCOUNTER — Encounter (INDEPENDENT_AMBULATORY_CARE_PROVIDER_SITE_OTHER): Payer: Self-pay | Admitting: *Deleted

## 2021-12-22 ENCOUNTER — Ambulatory Visit (HOSPITAL_COMMUNITY)
Admission: RE | Admit: 2021-12-22 | Discharge: 2021-12-22 | Disposition: A | Discharge status: Home / Self Care | Payer: Medicaid Other | Source: Ambulatory Visit | Attending: Gastroenterology | Admitting: Gastroenterology

## 2021-12-22 ENCOUNTER — Ambulatory Visit (HOSPITAL_COMMUNITY): Payer: Medicaid Other | Admitting: Anesthesiology

## 2021-12-22 ENCOUNTER — Ambulatory Visit (HOSPITAL_BASED_OUTPATIENT_CLINIC_OR_DEPARTMENT_OTHER): Payer: Medicaid Other | Admitting: Anesthesiology

## 2021-12-22 DIAGNOSIS — K635 Polyp of colon: Secondary | ICD-10-CM | POA: Diagnosis not present

## 2021-12-22 DIAGNOSIS — K22711 Barrett's esophagus with high grade dysplasia: Secondary | ICD-10-CM | POA: Diagnosis not present

## 2021-12-22 DIAGNOSIS — Z79899 Other long term (current) drug therapy: Secondary | ICD-10-CM | POA: Insufficient documentation

## 2021-12-22 DIAGNOSIS — F17210 Nicotine dependence, cigarettes, uncomplicated: Secondary | ICD-10-CM

## 2021-12-22 DIAGNOSIS — I10 Essential (primary) hypertension: Secondary | ICD-10-CM | POA: Diagnosis not present

## 2021-12-22 DIAGNOSIS — E785 Hyperlipidemia, unspecified: Secondary | ICD-10-CM | POA: Insufficient documentation

## 2021-12-22 DIAGNOSIS — R194 Change in bowel habit: Secondary | ICD-10-CM | POA: Diagnosis present

## 2021-12-22 DIAGNOSIS — R1319 Other dysphagia: Secondary | ICD-10-CM | POA: Diagnosis not present

## 2021-12-22 DIAGNOSIS — D490 Neoplasm of unspecified behavior of digestive system: Secondary | ICD-10-CM

## 2021-12-22 DIAGNOSIS — J449 Chronic obstructive pulmonary disease, unspecified: Secondary | ICD-10-CM | POA: Insufficient documentation

## 2021-12-22 DIAGNOSIS — R109 Unspecified abdominal pain: Secondary | ICD-10-CM | POA: Insufficient documentation

## 2021-12-22 DIAGNOSIS — K219 Gastro-esophageal reflux disease without esophagitis: Secondary | ICD-10-CM | POA: Diagnosis not present

## 2021-12-22 DIAGNOSIS — D124 Benign neoplasm of descending colon: Secondary | ICD-10-CM

## 2021-12-22 DIAGNOSIS — E876 Hypokalemia: Secondary | ICD-10-CM | POA: Insufficient documentation

## 2021-12-22 DIAGNOSIS — R140 Abdominal distension (gaseous): Secondary | ICD-10-CM

## 2021-12-22 DIAGNOSIS — Z1211 Encounter for screening for malignant neoplasm of colon: Secondary | ICD-10-CM

## 2021-12-22 DIAGNOSIS — K5730 Diverticulosis of large intestine without perforation or abscess without bleeding: Secondary | ICD-10-CM

## 2021-12-22 DIAGNOSIS — G4733 Obstructive sleep apnea (adult) (pediatric): Secondary | ICD-10-CM | POA: Diagnosis not present

## 2021-12-22 DIAGNOSIS — R1310 Dysphagia, unspecified: Secondary | ICD-10-CM | POA: Insufficient documentation

## 2021-12-22 HISTORY — PX: COLONOSCOPY WITH PROPOFOL: SHX5780

## 2021-12-22 HISTORY — PX: ESOPHAGOGASTRODUODENOSCOPY (EGD) WITH PROPOFOL: SHX5813

## 2021-12-22 HISTORY — PX: POLYPECTOMY: SHX5525

## 2021-12-22 HISTORY — PX: BIOPSY: SHX5522

## 2021-12-22 LAB — HM COLONOSCOPY

## 2021-12-22 SURGERY — COLONOSCOPY WITH PROPOFOL
Anesthesia: General

## 2021-12-22 MED ORDER — SIMETHICONE 125 MG PO CAPS: 1.0000 | ORAL_CAPSULE | Freq: Four times a day (QID) | ORAL | 1 refills | Status: AC | PRN

## 2021-12-22 MED ORDER — METOCLOPRAMIDE HCL 5 MG/ML IJ SOLN
10.0000 mg | Freq: Once | INTRAMUSCULAR | Status: AC
  Administered 2021-12-22: 10 mg via INTRAVENOUS

## 2021-12-22 MED ORDER — PROPOFOL 500 MG/50ML IV EMUL
INTRAVENOUS | Status: AC
  Filled 2021-12-22: qty 50

## 2021-12-22 MED ORDER — IPRATROPIUM-ALBUTEROL 0.5-2.5 (3) MG/3ML IN SOLN
3.0000 mL | Freq: Once | RESPIRATORY_TRACT | Status: AC
  Administered 2021-12-22: 3 mL via RESPIRATORY_TRACT

## 2021-12-22 MED ORDER — LACTATED RINGERS IV SOLN: INTRAVENOUS | Status: DC

## 2021-12-22 MED ORDER — LIDOCAINE HCL (PF) 2 % IJ SOLN
INTRAMUSCULAR | Status: AC
  Filled 2021-12-22: qty 5

## 2021-12-22 MED ORDER — KETAMINE HCL 50 MG/5ML IJ SOSY
PREFILLED_SYRINGE | INTRAMUSCULAR | Status: AC
  Filled 2021-12-22: qty 5

## 2021-12-22 MED ORDER — LIDOCAINE HCL (CARDIAC) PF 100 MG/5ML IV SOSY
PREFILLED_SYRINGE | INTRAVENOUS | Status: DC | PRN
  Administered 2021-12-22: 100 mg via INTRAVENOUS

## 2021-12-22 MED ORDER — IPRATROPIUM-ALBUTEROL 0.5-2.5 (3) MG/3ML IN SOLN
RESPIRATORY_TRACT | Status: AC
  Filled 2021-12-22: qty 3

## 2021-12-22 MED ORDER — METOCLOPRAMIDE HCL 5 MG/ML IJ SOLN
INTRAMUSCULAR | Status: AC
  Filled 2021-12-22: qty 2

## 2021-12-22 MED ORDER — PROPOFOL 10 MG/ML IV BOLUS
INTRAVENOUS | Status: DC | PRN
  Administered 2021-12-22: 20 mg via INTRAVENOUS

## 2021-12-22 MED ORDER — PROPOFOL 500 MG/50ML IV EMUL
INTRAVENOUS | Status: DC | PRN
  Administered 2021-12-22: 150 ug/kg/min via INTRAVENOUS

## 2021-12-22 MED ORDER — KETAMINE HCL 10 MG/ML IJ SOLN
INTRAMUSCULAR | Status: DC | PRN
  Administered 2021-12-22: 5 mg via INTRAVENOUS
  Administered 2021-12-22 (×2): 10 mg via INTRAVENOUS

## 2021-12-22 NOTE — Discharge Instructions (Addendum)
You are being discharged to home.  Resume your previous diet.  We are waiting for your pathology results.  Continue your present medications.  Your physician has recommended a repeat colonoscopy for surveillance based on pathology results.  Stop using high dose aspirin including Goody/BC powders, NSAIDs such as Aleve, meloxicam, ibuprofen, naproxen, Motrin, Voltaren or Advil (even the topical ones)

## 2021-12-22 NOTE — Transfer of Care (Signed)
Immediate Anesthesia Transfer of Care Note  Patient: Jerry Aguirre  Procedure(s) Performed: COLONOSCOPY WITH PROPOFOL ESOPHAGOGASTRODUODENOSCOPY (EGD) WITH PROPOFOL POLYPECTOMY BIOPSY  Patient Location: PACU  Anesthesia Type:General  Level of Consciousness: drowsy  Airway & Oxygen Therapy: Patient Spontanous Breathing  Post-op Assessment: Report given to RN and Post -op Vital signs reviewed and stable  Post vital signs: Reviewed and stable  Last Vitals:  Vitals Value Taken Time  BP 95/62 12/22/21 1049  Temp 36.7 C 12/22/21 1049  Pulse 79 12/22/21 1049  Resp 16 12/22/21 1049  SpO2 93 % 12/22/21 1049    Last Pain:  Vitals:   12/22/21 1049  TempSrc: Oral  PainSc: 0-No pain         Complications: No notable events documented.

## 2021-12-22 NOTE — Op Note (Addendum)
[facility] Patient Name: Jerry Aguirre Procedure Date: 12/22/2021 9:50 AM MRN: [id number] Date of Birth: 06/16/1965 Attending MD: Maylon Peppers , , [id number] CSN: [id number] Age: 56 Admit Type: Outpatient Procedure:                Upper GI endoscopy Indications:              Abdominal pain, Dysphagia, Abdominal bloating Providers:                Maylon Peppers, Janeece Riggers, RN, Everardo Pacific Referring MD:              Medicines:                Monitored Anesthesia Care Complications:            No immediate complications. Estimated Blood Loss:     Estimated blood loss: none. Procedure:                Pre-Anesthesia Assessment:                           - Prior to the procedure, a History and Physical                            was performed, and patient medications, allergies                            and sensitivities were reviewed. The patient's                            tolerance of previous anesthesia was reviewed.                           - The risks and benefits of the procedure and the                            sedation options and risks were discussed with the                            patient. All questions were answered and informed                            consent was obtained.                           - ASA Grade Assessment: III - A patient with severe                            systemic disease.                           After obtaining informed consent, the endoscope was                            passed under direct vision. Throughout the                            procedure, the  patient's blood pressure, pulse, and                            oxygen saturations were monitored continuously. The                            [id number]-[id number] ([id number]) scope was introduced through the                            mouth, and advanced to the second part of duodenum.                            The upper GI endoscopy was accomplished without                             difficulty. The patient tolerated the procedure                            well. Scope In: 10:03:37 AM Scope Out: 10:16:32 AM Total Procedure Duration: 0 hours 12 minutes 55 seconds  Findings:      A small, fungating mass with no bleeding and no stigmata of recent       bleeding was found in the distal esophagus, 39 to 41 cm from the       incisors, extending from the GE junction. The mass had some associated       fibrin components atached to it. The mass was non-obstructing and not       circumferential. Imaging was performed using white light and narrow band       imaging to visualize the mucosa. Biopsies were taken with a cold forceps       for histology.      The entire examined stomach was normal. Biopsies were taken with a cold       forceps for Helicobacter pylori testing.      The examined duodenum was normal. Biopsies were taken with a cold       forceps for histology. 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 Biopsied.                           - Normal stomach. Biopsied.                           - Normal examined duodenum. Biopsied. Moderate Sedation:      Per Anesthesia Care Recommendation:           - Discharge patient to home (ambulatory).                           - Resume previous diet.                           - Await pathology results.                           -  Continue present medications.                           - Stop using high dose aspirin including Goody/BC                            powders, NSAIDs such as Aleve, meloxicam,                            ibuprofen, naproxen, Motrin, Voltaren or Advil                            (even the topical ones)                           - Start simethicone every 6 hours as needed for                            bloating. Procedure Code(s):        --- Professional ---                           [phone], Esophagogastroduodenoscopy, flexible,                             transoral; with biopsy, single or multiple Diagnosis Code(s):        --- Professional ---                           D49.0, Neoplasm of unspecified behavior of                            digestive system                           R10.9, Unspecified abdominal pain                           R13.10, Dysphagia, unspecified                           R14.0, Abdominal distension (gaseous) CPT copyright 2022 American Medical Association. All rights reserved. The codes documented in this report are preliminary and upon coder review may  be revised to meet current compliance requirements. Maylon Peppers, MD Maylon Peppers,  12/22/2021 10:47:50 AM This report has been signed electronically. Number of Addenda: 0

## 2021-12-22 NOTE — Anesthesia Preprocedure Evaluation (Signed)
Anesthesia Evaluation  Patient identified by MRN, date of birth, ID band Patient awake    Reviewed: Allergy & Precautions, H&P , NPO status , Patient's Chart, lab work & pertinent test results  Airway Mallampati: II  TM Distance: >3 FB Neck ROM: Full    Dental  (+) Dental Advisory Given, Edentulous Upper, Edentulous Lower   Pulmonary shortness of breath and with exertion, sleep apnea (mild) , COPD,  COPD inhaler, Current Smoker    + wheezing      Cardiovascular hypertension, Pt. on medications + DOE  Normal cardiovascular exam Rhythm:Regular Rate:Normal     Neuro/Psych  Headaches  Neuromuscular disease  negative psych ROS   GI/Hepatic Neg liver ROS,GERD  Medicated and Poorly Controlled,,  Endo/Other  negative endocrine ROS    Renal/GU negative Renal ROS  negative genitourinary   Musculoskeletal negative musculoskeletal ROS (+)    Abdominal   Peds negative pediatric ROS (+)  Hematology negative hematology ROS (+)   Anesthesia Other Findings   Reproductive/Obstetrics negative OB ROS                              Anesthesia Physical Anesthesia Plan  ASA: 4  Anesthesia Plan: General   Post-op Pain Management: Minimal or no pain anticipated   Induction: Intravenous  PONV Risk Score and Plan: 1 and Propofol infusion  Airway Management Planned: Nasal Cannula and Natural Airway  Additional Equipment:   Intra-op Plan:   Post-operative Plan: Possible Post-op intubation/ventilation  Informed Consent: I have reviewed the patients History and Physical, chart, labs and discussed the procedure including the risks, benefits and alternatives for the proposed anesthesia with the patient or authorized representative who has indicated his/her understanding and acceptance.     Dental advisory given  Plan Discussed with: CRNA and Surgeon  Anesthesia Plan Comments:          Anesthesia  Quick Evaluation

## 2021-12-22 NOTE — Interval H&P Note (Signed)
History and Physical Interval Note:  12/22/2021 9:11 AM  Jerry Aguirre  has presented today for surgery, with the diagnosis of DYSPHAGIA, CHANGE IN BOWELS.  The various methods of treatment have been discussed with the patient and family. After consideration of risks, benefits and other options for treatment, the patient has consented to  Procedure(s) with comments: COLONOSCOPY WITH PROPOFOL (N/A) - 10:15am, asa 3 ESOPHAGOGASTRODUODENOSCOPY (EGD) WITH PROPOFOL (N/A) as a surgical intervention.  The patient's history has been reviewed, patient examined, no change in status, stable for surgery.  I have reviewed the patient's chart and labs.  Questions were answered to the patient's satisfaction.     [name]

## 2021-12-22 NOTE — Op Note (Signed)
[facility] Patient Name: Jerry Aguirre Procedure Date: 12/22/2021 9:51 AM MRN: [id number] Date of Birth: 1965-12-17 Attending MD: Maylon Peppers , , [id number] CSN: [id number] Age: 56 Admit Type: Outpatient Procedure:                Colonoscopy Indications:              Incidental change in bowel habits noted Providers:                Maylon Peppers, Janeece Riggers, RN, Everardo Pacific Referring MD:              Medicines:                Monitored Anesthesia Care Complications:            No immediate complications. Estimated Blood Loss:     Estimated blood loss: none. Procedure:                Pre-Anesthesia Assessment:                           - Prior to the procedure, a History and Physical                            was performed, and patient medications, allergies                            and sensitivities were reviewed. The patient's                            tolerance of previous anesthesia was reviewed.                           - The risks and benefits of the procedure and the                            sedation options and risks were discussed with the                            patient. All questions were answered and informed                            consent was obtained.                           - ASA Grade Assessment: III - A patient with severe                            systemic disease.                           After obtaining informed consent, the colonoscope                            was passed under direct vision. Throughout the                            procedure, the patient's  blood pressure, pulse, and                            oxygen saturations were monitored continuously. The                            [id number]-HQ190L ([id number]) scope was introduced through                            the anus and advanced to the the cecum, identified                            by appendiceal orifice and ileocecal valve. The                             colonoscopy was performed without difficulty. The                            patient tolerated the procedure well. The quality                            of the bowel preparation was adequate. Scope In: 10:21:36 AM Scope Out: 10:43:27 AM Scope Withdrawal Time: 0 hours 15 minutes 45 seconds  Total Procedure Duration: 0 hours 21 minutes 51 seconds  Findings:      The perianal and digital rectal examinations were normal.      An 8 mm polyp was found in the descending colon. The polyp was sessile.       The polyp was removed with a cold snare. Resection and retrieval were       complete.      Scattered medium-mouthed diverticula were found in the sigmoid colon and       descending colon. Biopsies for histology from the normal colon were       taken with a cold forceps from the right colon and left colon for       evaluation of microscopic colitis.      The retroflexed view of the distal rectum and anal verge was normal and       showed no anal or rectal abnormalities. Impression:               - One 8 mm polyp in the descending colon, removed                            with a cold snare. Resected and retrieved.                           - Diverticulosis in the sigmoid colon and in the                            descending colon. Biopsied.                           - The distal rectum and anal verge are normal on  retroflexion view. Moderate Sedation:      Per Anesthesia Care Recommendation:           - Discharge patient to home (ambulatory).                           - Resume previous diet.                           - Await pathology results.                           - Repeat colonoscopy for surveillance based on                            pathology results. Procedure Code(s):        --- Professional ---                           [phone], Colonoscopy, flexible; with removal of                            tumor(s), polyp(s), or other lesion(s) by snare                             technique                           [phone], Colonoscopy, flexible; with biopsy,                            single or multiple Diagnosis Code(s):        --- Professional ---                           D12.4, Benign neoplasm of descending colon                           K57.30, Diverticulosis of large intestine without                            perforation or abscess without bleeding CPT copyright 2022 American Medical Association. All rights reserved. The codes documented in this report are preliminary and upon coder review may  be revised to meet current compliance requirements. Maylon Peppers, MD Maylon Peppers,  12/22/2021 10:52:36 AM This report has been signed electronically. Number of Addenda: 0

## 2021-12-22 NOTE — Anesthesia Postprocedure Evaluation (Signed)
Anesthesia Post Note  Patient: Jerry Aguirre  Procedure(s) Performed: COLONOSCOPY WITH PROPOFOL ESOPHAGOGASTRODUODENOSCOPY (EGD) WITH PROPOFOL POLYPECTOMY BIOPSY  Patient location during evaluation: Phase II Anesthesia Type: General Level of consciousness: awake and alert and oriented Pain management: pain level controlled Vital Signs Assessment: post-procedure vital signs reviewed and stable Respiratory status: spontaneous breathing, nonlabored ventilation and respiratory function stable Cardiovascular status: blood pressure returned to baseline and stable Postop Assessment: no apparent nausea or vomiting Anesthetic complications: no  No notable events documented.   Last Vitals:  Vitals:   12/22/21 0850 12/22/21 1049  BP: 125/88 95/62  Pulse: 77 79  Resp: 18 16  Temp: 36.8 C 36.7 C  SpO2: 96% 93%    Last Pain:  Vitals:   12/22/21 1049  TempSrc: Oral  PainSc: 0-No pain                 Khyleigh Furney C Freddi Schrager

## 2021-12-23 ENCOUNTER — Other Ambulatory Visit: Payer: Self-pay | Admitting: Gastroenterology

## 2021-12-23 DIAGNOSIS — K22711 Barrett's esophagus with high grade dysplasia: Secondary | ICD-10-CM

## 2021-12-23 LAB — SURGICAL PATHOLOGY

## 2021-12-23 MED ORDER — OMEPRAZOLE 40 MG PO CPDR: 40.0000 mg | DELAYED_RELEASE_CAPSULE | Freq: Every day | ORAL | 3 refills | Status: DC

## 2021-12-26 NOTE — Progress Notes (Signed)
Referral  and notes faxed to [facility], they will review referral and call patient to scheduled apt

## 2021-12-26 NOTE — Progress Notes (Signed)
Referral and notes faxed to [facility] they will review referral and call patient with apt

## 2021-12-28 ENCOUNTER — Encounter (HOSPITAL_COMMUNITY): Payer: Self-pay | Admitting: Gastroenterology

## 2021-12-28 DIAGNOSIS — Z1331 Encounter for screening for depression: Secondary | ICD-10-CM | POA: Insufficient documentation

## 2021-12-28 NOTE — Assessment & Plan Note (Signed)
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  He was referred to orthopedic surgery received subacromial steroid injection recently, however he reports that his pain has not improved today.  He will follow-up in 6 weeks for reevaluation.

## 2021-12-28 NOTE — Assessment & Plan Note (Signed)
His PHQ-9 score is elevated today (13).  Denies SI/HI.  He attributes his current symptoms to his persisting medical conditions without clear etiology.  He has declined starting a medication or a referral to psychiatry today.  We will follow-up in 1 month.

## 2021-12-28 NOTE — Assessment & Plan Note (Addendum)
Persistent symptoms without clear etiology.  He was recently evaluated by general surgery and found to have diastasis recti, but this would not fully explain his symptoms.  No evidence of hernia identified.  He is scheduled to undergo EGD/colonoscopy with GI tomorrow (12/6).  We will tentatively plan for follow-up in 1 month to review results and see where things stand at that time.

## 2021-12-29 ENCOUNTER — Encounter: Payer: Self-pay | Admitting: Internal Medicine

## 2021-12-29 DIAGNOSIS — G4734 Idiopathic sleep related nonobstructive alveolar hypoventilation: Secondary | ICD-10-CM

## 2022-01-04 ENCOUNTER — Telehealth: Payer: Self-pay | Admitting: *Deleted

## 2022-01-04 NOTE — Telephone Encounter (Signed)
Called and spoke with Lattie Haw, she asked if we had ordered ONO for pt yet. Confirmed we did order on 12/21/21 and she states they did not receive order. Verified with Nunzio Cobbs and Nunzio Cobbs states she will fax order over to [facility] [employer] at [phone]. Lattie Haw confirmed understanding. Nothing further needed

## 2022-01-05 ENCOUNTER — Other Ambulatory Visit: Payer: Self-pay | Admitting: Internal Medicine

## 2022-01-05 ENCOUNTER — Other Ambulatory Visit: Payer: Self-pay | Admitting: Orthopedic Surgery

## 2022-01-17 ENCOUNTER — Other Ambulatory Visit: Payer: Self-pay | Admitting: Orthopedic Surgery

## 2022-01-17 ENCOUNTER — Encounter: Payer: Medicaid Other | Admitting: Primary Care

## 2022-01-18 ENCOUNTER — Ambulatory Visit (INDEPENDENT_AMBULATORY_CARE_PROVIDER_SITE_OTHER): Payer: Medicaid Other | Admitting: Acute Care

## 2022-01-18 ENCOUNTER — Encounter: Payer: Self-pay | Admitting: Acute Care

## 2022-01-18 DIAGNOSIS — F17210 Nicotine dependence, cigarettes, uncomplicated: Secondary | ICD-10-CM | POA: Diagnosis not present

## 2022-01-18 NOTE — Progress Notes (Signed)
Virtual Visit via Telephone Note  I connected with Jerry Aguirre on 01/18/22 at  1:30 PM EST by telephone and verified that I am speaking with the correct person using two identifiers.  Location: Patient:  At home Provider:  Virtual Visit via Telephone Note  I connected with Jerry Aguirre on 01/18/22 at  1:30 PM EST by telephone and verified that I am speaking with the correct person using two identifiers.  Location: Patient:  At home ( [place]) Provider:  [place], [place], [place], [address]    I discussed the limitations, risks, security and privacy concerns of performing an evaluation and management service by telephone and the availability of in person appointments. I also discussed with the patient that there may be a patient responsible charge related to this service. The patient expressed understanding and agreed to proceed.     Shared Decision Making Visit Lung Cancer Screening Program [phone])   Eligibility: Age 57 y.o. Pack Years Smoking History Calculation 83 pack year smoking history (# packs/per year x # years smoked) Recent History of coughing up blood  no Unexplained weight loss? no ( >Than 15 pounds within the last 6 months ) Prior History Lung / other cancer no (Diagnosis within the last 5 years already requiring surveillance chest CT Scans). Smoking Status Current Smoker Former Smokers: Years since quit:  NA  Quit Date:  NA  Visit Components: Discussion included one or more decision making aids. yes Discussion included risk/benefits of screening. yes Discussion included potential follow up diagnostic testing for abnormal scans. yes Discussion included meaning and risk of over diagnosis. yes Discussion included meaning and risk of False Positives. yes Discussion included meaning of total radiation exposure. yes  Counseling Included: Importance of adherence to annual lung cancer LDCT screening. yes Impact of comorbidities on ability to  participate in the program. yes Ability and willingness to under diagnostic treatment. yes  Smoking Cessation Counseling: Current Smokers:  Discussed importance of smoking cessation. yes Information about tobacco cessation classes and interventions provided to patient. yes Patient provided with "ticket" for LDCT Scan. yes Symptomatic Patient. no  Counseling NA Diagnosis Code: Tobacco Use Z72.0 Asymptomatic Patient yes  Counseling (Intermediate counseling: > three minutes counseling) J1941 Former Smokers:  Discussed the importance of maintaining cigarette abstinence. yes Diagnosis Code: Personal History of Nicotine Dependence. D40.814 Information about tobacco cessation classes and interventions provided to patient. Yes Patient provided with "ticket" for LDCT Scan. yes Written Order for Lung Cancer Screening with LDCT placed in Epic. Yes (CT Chest Lung Cancer Screening Low Dose W/O CM) [id number] Z12.2-Screening of respiratory organs Z87.891-Personal history of nicotine dependence  I have spent 25 minutes of face to face/ virtual visit   time with  Jerry Aguirre discussing the risks and benefits of lung cancer screening. We viewed / discussed a power point together that explained in detail the above noted topics. We paused at intervals to allow for questions to be asked and answered to ensure understanding.We discussed that the single most powerful action that he can take to decrease his risk of developing lung cancer is to quit smoking. We discussed whether or not he is ready to commit to setting a quit date. We discussed options for tools to aid in quitting smoking including nicotine replacement therapy, non-nicotine medications, support groups, Quit Smart classes, and behavior modification. We discussed that often times setting smaller, more achievable goals, such as eliminating 1 cigarette a day for a week and then 2 cigarettes a day  for a week can be helpful in slowly decreasing the number of  cigarettes smoked. This allows for a sense of accomplishment as well as providing a clinical benefit. I provided  him  with smoking cessation  information  with contact information for community resources, classes, free nicotine replacement therapy, and access to mobile apps, text messaging, and on-line smoking cessation help. I have also provided  hin  the office contact information in the event he needs to contact me, or the screening staff. We discussed the time and location of the scan, and that either Doroteo Glassman RN, Joella Prince, RN  or I will call / send a letter with the results within 24-72 hours of receiving them. The patient verbalized understanding of all of  the above and had no further questions upon leaving the office. They have my contact information in the event they have any further questions.  I spent 3 minutes counseling on smoking cessation and the health risks of continued tobacco abuse.  I explained to the patient that there has been a high incidence of coronary artery disease noted on these exams. I explained that this is a non-gated exam therefore degree or severity cannot be determined. This patient is on statin therapy. I have asked the patient to follow-up with their PCP regarding any incidental finding of coronary artery disease and management with diet or medication as their PCP  feels is clinically indicated. The patient verbalized understanding of the above and had no further questions upon completion of the visit.      Magdalen Spatz, NP 01/18/2022

## 2022-01-18 NOTE — Patient Instructions (Signed)
Thank you for participating in the [facility]. It was our pleasure to meet you today. We will call you with the results of your scan within the next few days. Your scan will be assigned a Lung RADS category score by the physicians reading the scans.  This Lung RADS score determines follow up scanning.  See below for description of categories, and follow up screening recommendations. We will be in touch to schedule your follow up screening annually or based on recommendations of our providers. We will fax a copy of your scan results to your Primary Care Physician, or the physician who referred you to the program, to ensure they have the results. Please call the office if you have any questions or concerns regarding your scanning experience or results.  Our office number is [phone]. Please speak with Denise Phelps, RN. , or  Denise Buckner RN, They are  our Lung Cancer Screening RN.'s If They are unavailable when you call, Please leave a message on the voice mail. We will return your call at our earliest convenience.This voice mail is monitored several times a day.  Remember, if your scan is normal, we will scan you annually as long as you continue to meet the criteria for the program. (Age 55-77, Current smoker or smoker who has quit within the last 15 years). If you are a smoker, remember, quitting is the single most powerful action that you can take to decrease your risk of lung cancer and other pulmonary, breathing related problems. We know quitting is hard, and we are here to help.  Please let us know if there is anything we can do to help you meet your goal of quitting. If you are a former smoker, congratulations. We are proud of you! Remain smoke free! Remember you can refer friends or family members through the number above.  We will screen them to make sure they meet criteria for the program. Thank you for helping us take better care of you by  participating in Lung Screening.  You can receive free nicotine replacement therapy ( patches, gum or mints) by calling 1-800-QUIT NOW. Please call so we can get you on the path to becoming  a non-smoker. I know it is hard, but you can do this!  Lung RADS Categories:  Lung RADS 1: no nodules or definitely non-concerning nodules.  Recommendation is for a repeat annual scan in 12 months.  Lung RADS 2:  nodules that are non-concerning in appearance and behavior with a very low likelihood of becoming an active cancer. Recommendation is for a repeat annual scan in 12 months.  Lung RADS 3: nodules that are probably non-concerning , includes nodules with a low likelihood of becoming an active cancer.  Recommendation is for a 6-month repeat screening scan. Often noted after an upper respiratory illness. We will be in touch to make sure you have no questions, and to schedule your 6-month scan.  Lung RADS 4 A: nodules with concerning findings, recommendation is most often for a follow up scan in 3 months or additional testing based on our provider's assessment of the scan. We will be in touch to make sure you have no questions and to schedule the recommended 3 month follow up scan.  Lung RADS 4 B:  indicates findings that are concerning. We will be in touch with you to schedule additional diagnostic testing based on our provider's  assessment of the scan.  Other options for assistance in smoking cessation (   As covered by your insurance benefits)  Hypnosis for smoking cessation  [employer] [phone]  Acupuncture for smoking cessation  [facility] [phone]   

## 2022-01-19 ENCOUNTER — Ambulatory Visit (HOSPITAL_COMMUNITY)
Admission: RE | Admit: 2022-01-19 | Discharge: 2022-01-19 | Disposition: A | Discharge status: Home / Self Care | Payer: Medicaid Other | Source: Ambulatory Visit | Attending: Acute Care | Admitting: Acute Care

## 2022-01-19 DIAGNOSIS — Z122 Encounter for screening for malignant neoplasm of respiratory organs: Secondary | ICD-10-CM | POA: Insufficient documentation

## 2022-01-19 DIAGNOSIS — F17210 Nicotine dependence, cigarettes, uncomplicated: Secondary | ICD-10-CM | POA: Diagnosis present

## 2022-01-19 DIAGNOSIS — Z87891 Personal history of nicotine dependence: Secondary | ICD-10-CM | POA: Diagnosis not present

## 2022-01-23 ENCOUNTER — Other Ambulatory Visit: Payer: Self-pay | Admitting: Acute Care

## 2022-01-23 ENCOUNTER — Encounter: Payer: Self-pay | Admitting: Internal Medicine

## 2022-01-23 ENCOUNTER — Ambulatory Visit (INDEPENDENT_AMBULATORY_CARE_PROVIDER_SITE_OTHER): Payer: Medicaid Other

## 2022-01-23 ENCOUNTER — Other Ambulatory Visit: Payer: Self-pay | Admitting: Internal Medicine

## 2022-01-23 ENCOUNTER — Ambulatory Visit (INDEPENDENT_AMBULATORY_CARE_PROVIDER_SITE_OTHER): Payer: Medicaid Other | Admitting: Internal Medicine

## 2022-01-23 ENCOUNTER — Ambulatory Visit (INDEPENDENT_AMBULATORY_CARE_PROVIDER_SITE_OTHER): Payer: Medicaid Other | Admitting: Orthopedic Surgery

## 2022-01-23 ENCOUNTER — Encounter: Payer: Self-pay | Admitting: Orthopedic Surgery

## 2022-01-23 VITALS — BP 124/76 | HR 94 | Ht 63.0 in | Wt 217.6 lb

## 2022-01-23 DIAGNOSIS — G8929 Other chronic pain: Secondary | ICD-10-CM

## 2022-01-23 DIAGNOSIS — Z87891 Personal history of nicotine dependence: Secondary | ICD-10-CM

## 2022-01-23 DIAGNOSIS — M79642 Pain in left hand: Secondary | ICD-10-CM

## 2022-01-23 DIAGNOSIS — K22711 Barrett's esophagus with high grade dysplasia: Secondary | ICD-10-CM

## 2022-01-23 DIAGNOSIS — R140 Abdominal distension (gaseous): Secondary | ICD-10-CM

## 2022-01-23 DIAGNOSIS — R202 Paresthesia of skin: Secondary | ICD-10-CM

## 2022-01-23 DIAGNOSIS — E559 Vitamin D deficiency, unspecified: Secondary | ICD-10-CM | POA: Diagnosis not present

## 2022-01-23 DIAGNOSIS — Z122 Encounter for screening for malignant neoplasm of respiratory organs: Secondary | ICD-10-CM

## 2022-01-23 DIAGNOSIS — I10 Essential (primary) hypertension: Secondary | ICD-10-CM

## 2022-01-23 DIAGNOSIS — M25511 Pain in right shoulder: Secondary | ICD-10-CM

## 2022-01-23 DIAGNOSIS — M542 Cervicalgia: Secondary | ICD-10-CM

## 2022-01-23 DIAGNOSIS — J4489 Other specified chronic obstructive pulmonary disease: Secondary | ICD-10-CM

## 2022-01-23 DIAGNOSIS — G4736 Sleep related hypoventilation in conditions classified elsewhere: Secondary | ICD-10-CM

## 2022-01-23 DIAGNOSIS — M79601 Pain in right arm: Secondary | ICD-10-CM | POA: Diagnosis not present

## 2022-01-23 DIAGNOSIS — E785 Hyperlipidemia, unspecified: Secondary | ICD-10-CM | POA: Diagnosis not present

## 2022-01-23 DIAGNOSIS — F17210 Nicotine dependence, cigarettes, uncomplicated: Secondary | ICD-10-CM

## 2022-01-23 DIAGNOSIS — M48061 Spinal stenosis, lumbar region without neurogenic claudication: Secondary | ICD-10-CM

## 2022-01-23 NOTE — Progress Notes (Signed)
New Patient Visit  Assessment: Jerry Aguirre is a 57 y.o. male with the following: Right shoulder pain Cervical radiculopathy, with numbness and weakness in the right upper extremity  Plan: Jerry Aguirre has pain in his right shoulder, with limited function.  Pain is radiating from his neck, through his shoulder and into his hand.  He has dense numbness and tingling in the right hand.  He does have a history of carpal tunnel syndrome, with surgical release.  Given his weakness, numbness and radiating pains, I am concerned that he has issues in his cervical spine.  As such, I am recommending a cervical spine MRI.  We will meet to discuss the results once they are available.  If the results of the cervical spine MRI do not require further treatment, we can focus on evaluating the right shoulder in more detail.   Follow-up: Return for After MRI.  Subjective:  Chief Complaint  Patient presents with   Shoulder Pain    Right / has severe pain     History of Present Illness: Jerry Aguirre is a 57 y.o. male who returns to clinic for repeat evaluation of his right shoulder.  I saw him in clinic a couple months ago.  I injected his right shoulder.  He states it helped with the pain for 1-2 days.  He has had no relief.  In addition, he continues to have pain radiating from his neck, through shoulder and into his hand.  He has numbness and tingling in the right hand.  He has very little range of motion of the right shoulder, and exhibits weakness of the right upper extremity.  In addition, he has some issues with his balance.  He does have a history of surgery on his lower back.  Review of Systems: No fevers or chills + numbness & tingling No chest pain No shortness of breath No bowel or bladder dysfunction No GI distress No headaches      Objective: There were no vitals taken for this visit.  Physical Exam:  General: Alert and oriented. and No acute distress. Gait: Ambulates with  the assistance of a cane.  Right shoulder tenderness to palpation over the anterior shoulder.  Forward flexion is limited to 90 degrees.  Positive Jobe's.  Positive drop arm test.  Numbness throughout the right hand.  Weakness throughout the right upper extremity.  Positive Hoffmann's bilaterally.  4/5 grip strength.    IMAGING: I personally reviewed images previously obtained in clinic  X-ray of the cervical spine was obtained in clinic today.  No acute injuries are noted.  Well-maintained disc height.  Small osteophytes are appreciated.  No anterolisthesis.  No bony lesions.  Impression: Cervical spine with diffuse degenerative changes  New Medications:  No orders of the defined types were placed in this encounter.     Jerry Rasmussen, MD  01/23/2022 9:23 AM

## 2022-01-23 NOTE — Progress Notes (Unsigned)
Established Patient Office Visit  Subjective   Patient ID: Jerry Aguirre, male    DOB: [birth year]  Age: 57 y.o. MRN: [id number]  Chief Complaint  Patient presents with   abdominal distention    Follow up   Jerry Aguirre returns to care today.  Jerry Aguirre was last evaluated by me on 12/6 at which time Jerry Aguirre continued to endorse symptoms of abdominal distention and shortness of breath.  Jerry Aguirre underwent EGD/colonoscopy with GI on 12/7, which revealed Barrett's esophagus with high-grade dysplasia.  Jerry Aguirre has been referred to [facility] for Esophageal Diseases and Swallowing and has an appointment tomorrow (1/1).  Jerry Aguirre has been seen by orthopedic surgery for follow-up of chronic right shoulder and neck pain.  Jerry Aguirre also completed an overnight oxygen test that showed desaturations below 89% for a total of 3 hours and 26 minutes while asleep.  Jerry Aguirre will be repeating this test on 2 L [place].  Today Jerry Aguirre states that his symptoms are largely unchanged.  Jerry Aguirre continues to endorse abdominal distention, lower extremity edema, and shortness of breath.  Jerry Aguirre is also concerned about pain in the dorsum of his left hand today.  Jerry Aguirre states that Jerry Aguirre has previously suffered an injury to the left hand and was told that there is a retained piece of metal present.  Jerry Aguirre is concerned that this is contributing to his current symptoms.  Past Medical History:  Diagnosis Date   COPD (chronic obstructive pulmonary disease) (HCC)    Dyspnea    GERD (gastroesophageal reflux disease)    Headache    Hyperlipidemia    Hypertension    Sleep apnea    pt says his "OSA was mild and they said Jerry Aguirre did not need a CPAP"   Past Surgical History:  Procedure Laterality Date   BIOPSY  12/22/2021   Procedure: BIOPSY;  Surgeon: Harvel Quale, MD;  [facility];  Service: Gastroenterology;;   COLONOSCOPY WITH PROPOFOL N/A 12/22/2021   Procedure: COLONOSCOPY WITH PROPOFOL;  Surgeon: Harvel Quale, MD;  [facility];  Service:  Gastroenterology;  Laterality: N/A;  10:15am, asa 3   ESOPHAGOGASTRODUODENOSCOPY (EGD) WITH PROPOFOL N/A 12/22/2021   Procedure: ESOPHAGOGASTRODUODENOSCOPY (EGD) WITH PROPOFOL;  Surgeon: Harvel Quale, MD;  [facility];  Service: Gastroenterology;  Laterality: N/A;   HERNIA REPAIR Right    inguinal   LUMBAR LAMINECTOMY/DECOMPRESSION MICRODISCECTOMY N/A 04/21/2021   Procedure: LAMINECTOMY, MEDIAL FACETECTOMY LUMBAR FOUR- FIVE;  Surgeon: Vallarie Mare, MD;  Location: [facility];  Service: Neurosurgery;  Laterality: N/A;   POLYPECTOMY  12/22/2021   Procedure: POLYPECTOMY;  Surgeon: Harvel Quale, MD;  [facility];  Service: Gastroenterology;;   TONSILLECTOMY     removed as a child   Social History   Tobacco Use   Smoking status: Every Day    Packs/day: 1.00    Types: Cigarettes    Passive exposure: Current   Smokeless tobacco: Never   Tobacco comments:    Less than 1 pack per day  Vaping Use   Vaping Use: Never used  Substance Use Topics   Alcohol use: Not Currently   Drug use: Yes    Types: Marijuana    Comment: twice a week   Family History  Problem Relation Age of Onset   Esophageal cancer Mother    Lung cancer Father    Allergies  Allergen Reactions   Lisinopril Itching    headaches   Review of Systems  Constitutional:  Positive for malaise/fatigue.  Respiratory:  Positive for shortness of breath.   Cardiovascular:  Positive for leg swelling.  Gastrointestinal:  Positive for abdominal pain (abdominal distention).  Musculoskeletal:  Positive for back pain (Lumbar back pain).       Pain in dorsum of left hand     Objective:     BP 124/76   Pulse 94   Ht '5\' 3"'$  (1.6 m)   Wt 217 lb 9.6 oz (98.7 kg)   SpO2 90%   BMI 38.55 kg/m  BP Readings from Last 3 Encounters:  01/23/22 124/76  12/22/21 95/62  12/21/21 115/74   Physical Exam Vitals reviewed.  Constitutional:      General: Jerry Aguirre is not in acute distress.     Appearance: Normal appearance. Jerry Aguirre is obese. Jerry Aguirre is not ill-appearing.  HENT:     Head: Normocephalic and atraumatic.     Right Ear: External ear normal.     Left Ear: External ear normal.     Nose: Nose normal. No congestion or rhinorrhea.     Mouth/Throat:     Mouth: Mucous membranes are moist.     Pharynx: Oropharynx is clear.  Eyes:     Extraocular Movements: Extraocular movements intact.     Conjunctiva/sclera: Conjunctivae normal.     Pupils: Pupils are equal, round, and reactive to light.  Cardiovascular:     Rate and Rhythm: Normal rate and regular rhythm.     Pulses: Normal pulses.     Heart sounds: Normal heart sounds. No murmur heard. Pulmonary:     Effort: Pulmonary effort is normal.     Breath sounds: Wheezing (diffuse, bilateral) present. No rhonchi or rales.  Abdominal:     General: Bowel sounds are normal. There is distension.     Tenderness: There is no abdominal tenderness.  Musculoskeletal:        General: No swelling or deformity. Normal range of motion.     Cervical back: Normal range of motion.     Right lower leg: Edema (2+) present.     Left lower leg: Edema (2+) present.  Skin:    General: Skin is warm and dry.     Capillary Refill: Capillary refill takes less than 2 seconds.     Comments: No obvious deformity on inspection of the left hand.  ROM is intact.  There is tenderness palpation over the third and fourth MCPs.  Neurological:     General: No focal deficit present.     Mental Status: Jerry Aguirre is alert and oriented to person, place, and time.     Motor: No weakness.  Psychiatric:        Mood and Affect: Mood normal.        Behavior: Behavior normal.        Thought Content: Thought content normal.    Last CBC Lab Results  Component Value Date   WBC 8.8 11/20/2021   HGB 14.5 11/20/2021   HCT 43.7 11/20/2021   MCV 90.5 11/20/2021   MCH 30.0 11/20/2021   RDW 13.9 11/20/2021   PLT 292 65/46/5035   Last metabolic panel Lab Results  Component  Value Date   GLUCOSE 129 (H) 01/23/2022   NA 142 01/23/2022   K 3.8 01/23/2022   CL 96 01/23/2022   CO2 29 01/23/2022   BUN 14 01/23/2022   CREATININE 1.35 (H) 01/23/2022   EGFR 61 01/23/2022   CALCIUM 10.0 01/23/2022   PROT 7.4 12/13/2021   ALBUMIN 4.6 12/13/2021  LABGLOB 2.8 12/13/2021   AGRATIO 1.6 12/13/2021   BILITOT 0.8 12/13/2021   ALKPHOS 102 12/13/2021   AST 34 12/13/2021   ALT 45 (H) 12/13/2021   ANIONGAP 8 11/20/2021   Last lipids Lab Results  Component Value Date   CHOL 147 01/23/2022   HDL 30 (L) 01/23/2022   LDLCALC 71 01/23/2022   TRIG 282 (H) 01/23/2022   CHOLHDL 4.9 01/23/2022   Last hemoglobin A1c Lab Results  Component Value Date   HGBA1C 6.2 (H) 09/22/2021   Last thyroid functions Lab Results  Component Value Date   TSH 1.410 09/13/2021   Last vitamin D Lab Results  Component Value Date   VD25OH 13.3 (L) 01/23/2022   Last vitamin B12 and Folate Lab Results  Component Value Date   VITAMINB12 272 09/22/2021   FOLATE >20.0 09/22/2021     Assessment & Plan:   Problem List Items Addressed This Visit       Barrett's esophagus with high grade dysplasia    Noted on EGD from last month.  Jerry Aguirre has been referred to [facility] for esophageal diseases and swallowing.  Jerry Aguirre has an appointment scheduled for tomorrow (1/9).      Lumbar stenosis    Jerry Aguirre reports today that his symptoms have worsened recently.  Jerry Aguirre plans to contact his neurosurgeons office to schedule follow-up.      Abdominal distention    Symptoms are largely unchanged.  Exact etiology remains unclear.  Last TTE was performed in July 2022 and showed normal EF with no valvular/structural abnormalities.  Jerry Aguirre continues to experience bilateral lower extremity edema as well.  Jerry Aguirre is still waiting for compression stockings from [place].  -Ongoing workup per GI -Continue Lasix 40 mg twice daily and triamterene-HCTZ -Consider referring to cardiology for evaluation if symptoms persist  without clear etiology      Left hand pain    Today Jerry Aguirre endorses pain in the dorsum of his left hand.  There is no inciting event or trauma at the onset of pain.  There are no obvious deformities on inspection and ROM is intact.  There is mild tenderness palpation over the third and fourth MCPs of the left hand.  Jerry Aguirre is concerned that there is a retained piece of metal from a previous laceration that is causing his current symptoms.  -I have ordered x-rays of the left hand today.      Return in about 3 months (around 04/24/2022).    Johnette Abraham, MD

## 2022-01-23 NOTE — Telephone Encounter (Signed)
See prior message

## 2022-01-23 NOTE — Patient Instructions (Signed)
It was a pleasure to see you today.  Thank you for giving [name] the opportunity to be involved in your care.  Below is a brief recap of your visit and next steps.  We will plan to see you again in 3 months.  Summary No medication changes today Please wear compression stocking when you are able to get them from [place] We will repeat labs today Follow up in 3 months

## 2022-01-23 NOTE — Patient Instructions (Signed)
Once the neck MRI is scheduled, please contact the clinic to schedule a follow up appointment.

## 2022-01-23 NOTE — Telephone Encounter (Signed)
ONO RA   01/07/22  desats x 3h26 min <89% so 01/23/2022 rec 2lpm and repeat and also be sure seeing sleep medicine or refer again

## 2022-01-24 ENCOUNTER — Ambulatory Visit (HOSPITAL_COMMUNITY)
Admission: RE | Admit: 2022-01-24 | Discharge: 2022-01-24 | Disposition: A | Discharge status: Home / Self Care | Payer: Medicaid Other | Source: Ambulatory Visit | Attending: Internal Medicine | Admitting: Internal Medicine

## 2022-01-24 ENCOUNTER — Other Ambulatory Visit: Payer: Self-pay | Admitting: Internal Medicine

## 2022-01-24 DIAGNOSIS — M79642 Pain in left hand: Secondary | ICD-10-CM | POA: Diagnosis present

## 2022-01-24 DIAGNOSIS — E559 Vitamin D deficiency, unspecified: Secondary | ICD-10-CM

## 2022-01-24 LAB — BASIC METABOLIC PANEL
BUN/Creatinine Ratio: 10 (ref 9–20)
BUN: 14 mg/dL (ref 6–24)
CO2: 29 mmol/L (ref 20–29)
Calcium: 10 mg/dL (ref 8.7–10.2)
Chloride: 96 mmol/L (ref 96–106)
Creatinine, Ser: 1.35 mg/dL — ABNORMAL HIGH (ref 0.76–1.27)
Glucose: 129 mg/dL — ABNORMAL HIGH (ref 70–99)
Potassium: 3.8 mmol/L (ref 3.5–5.2)
Sodium: 142 mmol/L (ref 134–144)
eGFR: 61 mL/min/{1.73_m2} (ref >59)

## 2022-01-24 LAB — LIPID PANEL
Chol/HDL Ratio: 4.9 ratio (ref 0.0–5.0)
Cholesterol, Total: 147 mg/dL (ref 100–199)
HDL: 30 mg/dL — ABNORMAL LOW
LDL Chol Calc (NIH): 71 mg/dL (ref 0–99)
Triglycerides: 282 mg/dL — ABNORMAL HIGH (ref 0–149)
VLDL Cholesterol Cal: 46 mg/dL — ABNORMAL HIGH (ref 5–40)

## 2022-01-24 LAB — VITAMIN D 25 HYDROXY (VIT D DEFICIENCY, FRACTURES): Vit D, 25-Hydroxy: 13.3 ng/mL — ABNORMAL LOW (ref 30.0–100.0)

## 2022-01-24 MED ORDER — VITAMIN D (ERGOCALCIFEROL) 1.25 MG (50000 UNIT) PO CAPS: 50000.0000 [IU] | ORAL_CAPSULE | ORAL | 0 refills | Status: DC

## 2022-01-25 ENCOUNTER — Telehealth: Payer: Self-pay | Admitting: Internal Medicine

## 2022-01-25 ENCOUNTER — Other Ambulatory Visit: Payer: Self-pay

## 2022-01-25 DIAGNOSIS — K22711 Barrett's esophagus with high grade dysplasia: Secondary | ICD-10-CM | POA: Insufficient documentation

## 2022-01-25 DIAGNOSIS — R600 Localized edema: Secondary | ICD-10-CM

## 2022-01-25 DIAGNOSIS — M79642 Pain in left hand: Secondary | ICD-10-CM | POA: Insufficient documentation

## 2022-01-25 NOTE — Assessment & Plan Note (Signed)
Noted on EGD from last month.  He has been referred to [facility] for esophageal diseases and swallowing.  He has an appointment scheduled for tomorrow (1/9).

## 2022-01-25 NOTE — Telephone Encounter (Signed)
Patient has visit with [facility] office in [place] today. Office request that patient have referral to cardiology for surgery clearance.   Patient / sister wants a call back when referral placed

## 2022-01-25 NOTE — Telephone Encounter (Signed)
Referral placed.

## 2022-01-25 NOTE — Assessment & Plan Note (Signed)
Symptoms are largely unchanged.  Exact etiology remains unclear.  Last TTE was performed in July 2022 and showed normal EF with no valvular/structural abnormalities.  He continues to experience bilateral lower extremity edema as well.  He is still waiting for compression stockings from [place].  -Ongoing workup per GI -Continue Lasix 40 mg twice daily and triamterene-HCTZ -Consider referring to cardiology for evaluation if symptoms persist without clear etiology

## 2022-01-25 NOTE — Assessment & Plan Note (Signed)
He reports today that his symptoms have worsened recently.  He plans to contact his neurosurgeons office to schedule follow-up.

## 2022-01-25 NOTE — Assessment & Plan Note (Signed)
Today he endorses pain in the dorsum of his left hand.  There is no inciting event or trauma at the onset of pain.  There are no obvious deformities on inspection and ROM is intact.  There is mild tenderness palpation over the third and fourth MCPs of the left hand.  He is concerned that there is a retained piece of metal from a previous laceration that is causing his current symptoms.  -I have ordered x-rays of the left hand today.

## 2022-01-29 NOTE — Progress Notes (Unsigned)
Jerry Aguirre, male    DOB: 08/05/1965   MRN: [id number]   Brief patient profile:  18  yowm active smoker/MM  referred to pulmonary clinic 07/27/2021 by NP Jerry Aguirre in [place]  for breathing        History of Present Illness  07/27/2021  Pulmonary/ 1st Aguirre eval/Jerry Aguirre  Chief Complaint  Patient presents with   Consult    Sob with exertion x 2 yrs., cough-green occass.,o2 sats 64-70's occass.  Dyspnea:  walks with crutch due to knees / walking to chicken house and up the ramp the house  Push mower x 5 min / [employer] from one end of store to other  Cough: worse in am / sometimes green on abx day 7 keflex  Sleep: no cpap / 2.5 lpm hs x 2009 and hypersomnolent, seeing neuro for cpap but not able to work out sleep issues/cpap effectiveness so far SABA use: four times a day  plus symb Rec [name] will be contacting you by phone for referral to sleep medicine in [place] and you should not drive until this problem is corrected > not seen as of 09/13/2021 and not on schedule to see  Plan A = Automatic = Always=    symbicort 80 Take 2 puffs first thing in am and then another 2 puffs about 12 hours later.  Work on inhaler technique: Plan B = Backup (to supplement plan A, not to replace it) Only use your albuterol inhaler as a rescue medication  Make sure you check your oxygen saturation  AT  your highest level of activity (not after you stop)   to be sure it stays over 90% Please remember to go to the lab   Allergy screen 0.3/   IgE 462    alpha one AT phenotype  MM  Level 158         09/13/2021  f/u ov/Jerry Aguirre/Jerry Aguirre re: GOLD 3  maint on symbicort 80 c/o swelling all over Chief Complaint  Patient presents with   Follow-up    Has not had sleep apt scheduled- no one called.  Breathing has worsened since last ov.   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 but did not check sats as rec  Cough: very light green, worse in am / no better p prednisone or abx  Sleeping: very  poorly in lift chair / only sleeps 2 h then awake rest of night/ was told by neuro he does not have osa but pt wants second opinion  SABA use: 3 x daily helps some  02: 2.5 lpm at hs and prn daytime but not titrating to sats  Covid status: none, flatly declines to consider  Lung cancer screening: eligible, rec  Rec [name] will be contacting you by phone for referral to Sleep Medicine  (do drive in meantime)  - if you don't hear back from [name] within one week please call us back or notify us thru MyChart and we'll address it right away.  Protonix 40 mg Take 30- 60 min before your first and last meals of the day  GERD diet  For cough > mucinex dm 1200 mg every 12 hours as needed  Plan A = Automatic = Always=    Change symbicort  to Breztri Take 2 puffs first thing in am and then another 2 puffs about 12 hours later.   Plan B = Backup (to supplement plan A, not to replace it) Only use your albuterol inhaler as a  rescue medication  Make sure you check your oxygen saturation  AT  your highest level of activity (not after you stop)  The key is to stop smoking completely before smoking completely stops you! Please schedule a follow up Aguirre visit in 2 weeks, sooner if needed  with all medications /inhalers/ solutions in hand so we can verify exactly what you are taking. This includes all medications from all doctors and over the counters  - if getting worse on present plan go to ER  ADDENDUM:  instructions should say do NOT  drive in meantime/expedite referral to sleep medicine    09/27/2021  f/u ov/Jerry Aguirre/Jerry Aguirre re: GOLD 3 copd/ smoking  maint on breztri   Chief Complaint  Patient presents with   Follow-up    He is doing good during the day and he is worse at night, he is having some congestion.    Dyspnea:  better walking to chickens/ still smoking  Cough: mucoid, assoc with worse  nasal congestion  Sleeping: in a lift  chair feels suffocating, resolves p 5 min with or without  saba SABA use: using way to much, not taking ppi as rec  02: 2.5 lpm  Covid status: refuses to consider  Lung cancer screening: done  Rec Plan A = Automatic = Always=    Breztri Take 2 puffs first thing in am and then another 2 puffs about 12 hours later.   Work on inhaler technique:  Plan B = Backup (to supplement plan A, not to replace it) Only use your albuterol inhaler as a rescue medication Ok to try albuterol 15 min before an activity (on alternating days)  that you know would usually make you short of breath  Protonix (pantoprazole) is  Take 30- 60 min before your first and last meals of the day     11/08/2021  f/u ov/Jerry Aguirre/Jerry Aguirre re: GOLD 3 / still smoking  maint on Breztri   Chief Complaint  Patient presents with   Follow-up    Breathing is getting worse. Is out of breztri inhaler    Dyspnea:  walking at [employer] half way to back slower than others = MMRC3 = can't walk 100 yards even at a slow pace at a flat grade s stopping due to sob   Cough: hoarse/ white  Sleeping: lift chair 45 degrees/ suffocating immediately > sleep stud pending  SABA use: none now  02:  2.5 lpm hs only  Covid status: refuses vax Lung cancer screening: scheduled for 12/21/21  Rec Plan A = Automatic = Always=    Symbicort 160 Take 2 puffs first thing in am and then another 2 puffs about 12 hours later and chase the AM dose of symbicort with spriva x 2 puffs Work on inhaler technique:   Plan B = Backup (to supplement plan A, not to replace it) Only use your albuterol inhaler as a rescue medication  Ok to try albuterol 15 min before an activity (on alternating days)  that you know would usually make you short of breath    [name] will be contacting you by phone for referral to ENT eval in [facility]     01/30/2022  f/u ov/Jerry Aguirre/Jerry Aguirre re: *** maint on ***  No chief complaint on file.   Dyspnea:  *** Cough: *** Sleeping: *** SABA use: *** 02: *** Covid status: *** Lung  cancer screening: ***   No obvious day to day or daytime variability or assoc excess/ purulent sputum or mucus  plugs or hemoptysis or cp or chest tightness, subjective wheeze or overt sinus or hb symptoms.   *** without nocturnal  or early am exacerbation  of respiratory  c/o's or need for noct saba. Also denies any obvious fluctuation of symptoms with weather or environmental changes or other aggravating or alleviating factors except as outlined above   No unusual exposure hx or h/o childhood pna/ asthma or knowledge of premature birth.  Current Allergies, Complete Past Medical History, Past Surgical History, Family History, and Social History were reviewed in [employer] record.  ROS  The following are not active complaints unless bolded Hoarseness, sore throat, dysphagia, dental problems, itching, sneezing,  nasal congestion or discharge of excess mucus or purulent secretions, ear ache,   fever, chills, sweats, unintended wt loss or wt gain, classically pleuritic or exertional cp,  orthopnea pnd or arm/hand swelling  or leg swelling, presyncope, palpitations, abdominal pain, anorexia, nausea, vomiting, diarrhea  or change in bowel habits or change in bladder habits, change in stools or change in urine, dysuria, hematuria,  rash, arthralgias, visual complaints, headache, numbness, weakness or ataxia or problems with walking or coordination,  change in mood or  memory.        No outpatient medications have been marked as taking for the 01/30/22 encounter (Appointment) with Tanda Rockers, MD.                         Past Medical History:  Diagnosis Date   COPD (chronic obstructive pulmonary disease) ([place])    GERD (gastroesophageal reflux disease)    Headache    Hyperlipidemia    Hypertension    Sleep apnea    pt says his "OSA was mild and they said he did not need a CPAP"      Objective:    Wts   01/30/2022        ***   09/27/21 203 lb 12.8 oz (92.4  kg)  09/22/21 209 lb 12.8 oz (95.2 kg)  09/13/21 211 lb 9.6 oz (96 kg)      Vital signs reviewed  01/30/2022  - Note at rest 02 sats  ***% on ***   General appearance:    ***    Mod bar*** 1+ bilateral LE pitting edema***                                   Assessment

## 2022-01-30 ENCOUNTER — Other Ambulatory Visit: Payer: Self-pay

## 2022-01-30 ENCOUNTER — Other Ambulatory Visit: Payer: Self-pay | Admitting: Orthopedic Surgery

## 2022-01-30 ENCOUNTER — Other Ambulatory Visit: Payer: Self-pay | Admitting: Internal Medicine

## 2022-01-30 ENCOUNTER — Ambulatory Visit (INDEPENDENT_AMBULATORY_CARE_PROVIDER_SITE_OTHER): Payer: Medicaid Other | Admitting: Internal Medicine

## 2022-01-30 ENCOUNTER — Encounter: Payer: Self-pay | Admitting: Internal Medicine

## 2022-01-30 VITALS — BP 138/76 | HR 78 | Temp 97.6°F | Wt 218.8 lb

## 2022-01-30 DIAGNOSIS — J4489 Other specified chronic obstructive pulmonary disease: Secondary | ICD-10-CM | POA: Diagnosis not present

## 2022-01-30 DIAGNOSIS — F17210 Nicotine dependence, cigarettes, uncomplicated: Secondary | ICD-10-CM | POA: Diagnosis not present

## 2022-01-30 DIAGNOSIS — J449 Chronic obstructive pulmonary disease, unspecified: Secondary | ICD-10-CM

## 2022-01-30 DIAGNOSIS — G4736 Sleep related hypoventilation in conditions classified elsewhere: Secondary | ICD-10-CM | POA: Diagnosis not present

## 2022-01-30 MED ORDER — SPIRIVA RESPIMAT 2.5 MCG/ACT IN AERS: INHALATION_SPRAY | RESPIRATORY_TRACT | 11 refills | Status: DC

## 2022-01-30 MED ORDER — AZITHROMYCIN 250 MG PO TABS: ORAL_TABLET | ORAL | 0 refills | Status: DC

## 2022-01-30 MED ORDER — PREDNISONE 10 MG PO TABS: ORAL_TABLET | ORAL | 0 refills | Status: DC

## 2022-01-30 NOTE — Assessment & Plan Note (Addendum)
4-5 min discussion re active cigarette smoking in addition to office E&M  Ask about tobacco use:   ongoing Advise quitting   I took an extended  opportunity with this patient to outline the consequences of continued cigarette use  in airway disorders based on all the data we have from the multiple national lung health studies (perfomed over decades at millions of dollars in cost)  indicating that smoking cessation, not choice of inhalers or physicians, is the most important aspect of his care esp in view of LDSCT findings of emphysema for which only way to reduce the progession = smoking cessation    Assess willingness:  Not committed at this point Assist in quit attempt:  Per PCP when ready Arrange follow up:   Follow up per Primary Care planned

## 2022-01-30 NOTE — Patient Instructions (Addendum)
Zpak Prednisone 10 mg take  4 each am x 2 days,   2 each am x 2 days,  1 each am x 2 days and stop   -  Only use your albuterol as a rescue medication to be used if you can't catch your breath by resting or doing a relaxed purse lip breathing pattern.  - The less you use it, the better it will work when you need it. - Ok to use up to 2 puffs  every 4 hours if you must but call for immediate appointment if use goes up over your usual need - Don't leave home without it !!  (think of it like  starter fluid for your car)   Also  Ok to try albuterol 15 min before an activity (on alternating days)  that you know would usually make you short of breath and see if it makes any difference and if makes none then don't take albuterol after activity unless you can't catch your breath as this means it's the resting that helps, not the albuterol.     The key is to stop smoking completely before smoking completely stops you!   Keep appt with Dr Elsworth Soho and be sure to wear your 02 every night especially on your overnight test    Please schedule a follow up visit in 3 months but call sooner if needed

## 2022-01-30 NOTE — Assessment & Plan Note (Signed)
Active smoking/MM Spirometry 07/27/2021  FEV1 1.4 (46%)  Ratio 0.63 and mildly concave   - 07/27/2021   Walked on RA  x  3  lap(s) =  approx 750  ft  @ avg/cane pace, stopped due to end of study with L Leg pain with lowest 02 sats  96%  -  07/27/2021  :  Allergy screen 0.3/   IgE 462    alpha one AT phenotype  MM  Level 158 - 07/27/2021 trial of symbicort 80 2bid since cough > doe    - 09/13/2021  After extensive coaching inhaler device,  effectiveness =   75% (short Ti) > changed to breztri 2bid as sob >> cough  - 11/08/2021  After extensive coaching inhaler device,  effectiveness =    75% SMI > spiriva/ symbicort 160 - 01/30/2022  After extensive coaching inhaler device,  effectiveness =    75% on smi and hfa    Group D (now reclassified as E) in terms of symptom/risk and laba/lama/ICS  therefore appropriate rx at this point >>>  symb/spiriva plus more approp saba   Re SABA :  I spent extra time with pt today reviewing appropriate use of albuterol for prn use on exertion with the following points: 1) saba is for relief of sob that does not improve by walking a slower pace or resting but rather if the pt does not improve after trying this first. 2) If the pt is convinced, as many are, that saba helps recover from activity faster then it's easy to tell if this is the case by re-challenging : ie stop, take the inhaler, then p 5 minutes try the exact same activity (intensity of workload) that just caused the symptoms and see if they are substantially diminished or not after saba 3) if there is an activity that reproducibly causes the symptoms, try the saba 15 min before the activity on alternate days   If in fact the saba really does help, then fine to continue to use it prn but advised may need to look closer at the maintenance regimen being used to achieve better control of airways disease with exertion.   Having mild flare at present > rec zpak/ Prednisone 10 mg take  4 each am x 2 days,   2 each am x 2  days,  1 each am x 2 days and stop

## 2022-01-30 NOTE — Assessment & Plan Note (Addendum)
See sleep study  12/2018 - As of 07/27/2021 2.5 lpm hs and no desats walking - referred to sleep medicine 07/27/2021 >>> not scheduled to see as of 09/12/2021 > referred again 09/13/2021  - 11/08/2021   Walked on RA  x  3  lap(s) =  approx 450  ft  @ mod pace cane assist, stopped due to end of study c/o sob on laps 2/3  with lowest 02 sats 93%  - ONO RA   01/07/22  desats x 3h 26 min <89% so 01/23/2022 rec 2lpm and repeat and also be sure seeing sleep medicine or refer again - 01/30/2022 mod pace room air  x 2 laps 150 ft each and cane assist =  Mild SOB laps one and two. Could not complete lap 3 because of hip pain with lowest SATS  95% on RA  No 02 needed daytime for now   Sleep issues still under eval with repeat ono on 2lpm pending as well as formal sleep eval  here > Dr Elsworth Soho   Each maintenance medication was reviewed in detail including emphasizing most importantly the difference between maintenance and prns and under what circumstances the prns are to be triggered using an action plan format where appropriate.  Total time for H and P, chart review, counseling, reviewing hfa/smi/02 device(s) , directly observing portions of ambulatory 02 saturation study/ and generating customized AVS unique to this office visit / same day charting > 30 min for multiple  refractory respiratory  symptoms of uncertain etiology

## 2022-01-31 ENCOUNTER — Ambulatory Visit (INDEPENDENT_AMBULATORY_CARE_PROVIDER_SITE_OTHER): Payer: Medicaid Other | Admitting: Cardiovascular Disease

## 2022-01-31 ENCOUNTER — Encounter: Payer: Medicaid Other | Admitting: Primary Care

## 2022-01-31 ENCOUNTER — Encounter: Payer: Self-pay | Admitting: Cardiovascular Disease

## 2022-01-31 ENCOUNTER — Ambulatory Visit (HOSPITAL_COMMUNITY)
Admission: RE | Admit: 2022-01-31 | Discharge: 2022-01-31 | Disposition: A | Discharge status: Home / Self Care | Payer: Medicaid Other | Source: Ambulatory Visit | Attending: Orthopedic Surgery | Admitting: Orthopedic Surgery

## 2022-01-31 VITALS — BP 118/68 | HR 67 | Ht 63.0 in | Wt 217.0 lb

## 2022-01-31 DIAGNOSIS — G8929 Other chronic pain: Secondary | ICD-10-CM | POA: Insufficient documentation

## 2022-01-31 DIAGNOSIS — I10 Essential (primary) hypertension: Secondary | ICD-10-CM | POA: Insufficient documentation

## 2022-01-31 DIAGNOSIS — M79609 Pain in unspecified limb: Secondary | ICD-10-CM | POA: Insufficient documentation

## 2022-01-31 DIAGNOSIS — Z01810 Encounter for preprocedural cardiovascular examination: Secondary | ICD-10-CM | POA: Insufficient documentation

## 2022-01-31 DIAGNOSIS — M542 Cervicalgia: Secondary | ICD-10-CM | POA: Diagnosis present

## 2022-01-31 DIAGNOSIS — E785 Hyperlipidemia, unspecified: Secondary | ICD-10-CM

## 2022-01-31 DIAGNOSIS — R202 Paresthesia of skin: Secondary | ICD-10-CM | POA: Insufficient documentation

## 2022-01-31 NOTE — Assessment & Plan Note (Signed)
History of greater than 100-pack-year tobacco abuse smoking 3 packs a day since he was a teenager, currently smoking less than 1 pack a day for last several years.  He does have COPD.

## 2022-01-31 NOTE — Assessment & Plan Note (Signed)
History of dyslipidemia on atorvastatin and Zetia with lipid profile performed 01/23/2022 revealing total cholesterol 147, LDL 71 and HDL 30.

## 2022-01-31 NOTE — Patient Instructions (Signed)
Medication Instructions:  No changes *If you need a refill on your cardiac medications before your next appointment, please call your pharmacy*  Testing/Procedures: Your physician has requested that you have an echocardiogram. Echocardiography is a painless test that uses sound waves to create images of your heart. It provides your doctor with information about the size and shape of your heart and how well your heart's chambers and valves are working. This procedure takes approximately one hour. There are no restrictions for this procedure. Please do NOT wear cologne, perfume, aftershave, or lotions (deodorant is allowed). Please arrive 15 minutes prior to your appointment time.   CT- Calcium Scoring   Follow-Up: At [facility], you and your health needs are our priority.  As part of our continuing mission to provide you with exceptional heart care, we have created designated Provider Care Teams.  These Care Teams include your primary Cardiologist (physician) and Advanced Practice Providers (APPs -  Physician Assistants and Nurse Practitioners) who all work together to provide you with the care you need, when you need it.  We recommend signing up for the patient portal called "MyChart".  Sign up information is provided on this After Visit Summary.  MyChart is used to connect with patients for Virtual Visits (Telemedicine).  Patients are able to view lab/test results, encounter notes, upcoming appointments, etc.  Non-urgent messages can be sent to your provider as well.   To learn more about what you can do with MyChart, go to [email or web address].    Your next appointment:   6 month(s)  Provider:   Dr Gwenlyn Found   Other Instructions Surgical Clearance will be Based on findings from testing/procedures.

## 2022-01-31 NOTE — Progress Notes (Signed)
01/31/2022 Jerry Aguirre   March 04, 1965  [id number]  Primary Physician Johnette Abraham, MD Primary Cardiologist: Lorretta Harp MD Garret Reddish, [place], [place]  HPI:  Jerry Aguirre is a 57 y.o. moderately overweight divorced Caucasian male father of 2, grandfather 4 grandchildren is accompanied by his Sister Margarito Courser who is a CMA.  He was referred by his PCP, Dr. Olin Hauser, because of lower extremity edema and for preoperative clearance before endoscopy at [facility].  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  His risk factors include greater than 100-pack-year tobacco abuse having smoked 3 packs a day since adolescence now down to less than 1 pack a day for last several years.  Does have COPD.  He has treated hypertension and hyperlipidemia.  His father had a CABG in his mid 28s and died several years thereafter.  He is never had a heart attack or stroke.  He gets occasional atypical chest pain.  Apparently he has Barrett's esophagus and needs endoscopy and further evaluation.  He did have a 2D echocardiogram performed a year ago that was unremarkable.  He also has 2-3+ pitting lower extreme edema on both furosemide and Dyazide.  He had a chest CT performed 1//24 for cancer screening that showed mild coronary artery calcification.   Current Meds  Medication Sig   albuterol (VENTOLIN HFA) 108 (90 Base) MCG/ACT inhaler Inhale 2 puffs into the lungs every 4 (four) hours as needed for wheezing or shortness of breath.   aspirin EC 81 MG EC tablet Take 1 tablet (81 mg total) by mouth daily. Swallow whole.   atorvastatin (LIPITOR) 40 MG tablet Take 1 tablet (40 mg total) by mouth daily at 6 PM.   azithromycin (ZITHROMAX) 250 MG tablet Take 2 on day one then 1 daily x 4 days   baclofen (LIORESAL) 10 MG tablet TAKE 1 TABLET BY MOUTH TWICE DAILY AS NEEDED.   cetirizine  (ZYRTEC) 10 MG tablet TAKE 1 TABLET BY MOUTH ONCE DAILY AS NEEDED FOR ALLERGY SYMPTOMS.   Chlorphen-Phenyleph-ASA (ALKA-SELTZER PLUS COLD PO) Take 2 tablets by mouth at bedtime.   cyclobenzaprine (FLEXERIL) 10 MG tablet TAKE (1) TABLET BY MOUTH TWICE DAILY AS NEEDED FOR MUSCLE SPASMS.   docusate sodium (COLACE) 100 MG capsule Take 1 capsule (100 mg total) by mouth 2 (two) times daily.   Elastic Bandages & Supports (MEDICAL COMPRESSION STOCKINGS) MISC 2 each by Does not apply route daily.   ezetimibe (ZETIA) 10 MG tablet Take 1 tablet (10 mg total) by mouth daily.   furosemide (LASIX) 40 MG tablet Take 1 tablet (40 mg total) by mouth 2 (two) times daily.   lactulose (CHRONULAC) 10 GM/15ML solution Take 10 g by mouth at bedtime.   meloxicam (MOBIC) 7.5 MG tablet Take 1 tablet (7.5 mg total) by mouth 2 (two) times daily.   Menthol-Zinc Oxide (GOLD BOND EX) Apply 1 Application topically daily as needed (dry skin).   metoprolol succinate (TOPROL-XL) 25 MG 24 hr tablet TAKE ONE TABLET BY MOUTH ONCE DAILY AT BEDTIME.   omeprazole (PRILOSEC) 40 MG capsule Take 1 capsule (40 mg total) by mouth daily.   ondansetron (ZOFRAN) 4 MG tablet Take 4 mg by mouth 2 (two) times daily.   oxyCODONE-acetaminophen (PERCOCET) 10-325 MG tablet Take 1 tablet by mouth every 6 (six) hours as needed for pain.   pramipexole (MIRAPEX) 0.75 MG  tablet Take 0.75-1.5 mg by mouth See admin instructions. Take 0.75 mg in the morning and 1.5 mg at night   predniSONE (DELTASONE) 10 MG tablet Take  4 each am x 2 days,   2 each am x 2 days,  1 each am x 2 days and stop   pregabalin (LYRICA) 150 MG capsule Take 150 mg by mouth at bedtime.   Pseudoeph-Doxylamine-DM-APAP (NYQUIL PO) Take 30 mLs by mouth at bedtime.   Simethicone 125 MG CAPS Take 1 capsule (125 mg total) by mouth every 6 (six) hours as needed (bloating).   SYMBICORT 80-4.5 MCG/ACT inhaler Inhale 2 puffs into the lungs 2 (two) times daily.   Tiotropium Bromide Monohydrate  (SPIRIVA RESPIMAT) 2.5 MCG/ACT AERS 2 puffs each am   triamterene-hydrochlorothiazide (DYAZIDE) 37.5-25 MG capsule Take 1 capsule by mouth daily.   Vitamin D, Ergocalciferol, (DRISDOL) 1.25 MG (50000 UNIT) CAPS capsule Take 1 capsule (50,000 Units total) by mouth every 7 (seven) days for 12 doses.   zolpidem (AMBIEN) 10 MG tablet Take 10 mg by mouth at bedtime.     Allergies  Allergen Reactions   Lisinopril Itching    headaches    Social History   Socioeconomic History   Marital status: Single    Spouse name: Not on file   Number of children: 2   Years of education: Not on file   Highest education level: Not on file  Occupational History   Not on file  Tobacco Use   Smoking status: Every Day    Packs/day: 1.00    Types: Cigarettes    Passive exposure: Current   Smokeless tobacco: Never   Tobacco comments:    Less than 1 pack per day  Vaping Use   Vaping Use: Never used  Substance and Sexual Activity   Alcohol use: Not Currently   Drug use: Yes    Types: Marijuana    Comment: twice a week   Sexual activity: Not on file  Other Topics Concern   Not on file  Social History Narrative   Not on file   Social Determinants of Health   Financial Resource Strain: Not on file  Food Insecurity: Not on file  Transportation Needs: Not on file  Physical Activity: Not on file  Stress: Not on file  Social Connections: Not on file  Intimate Partner Violence: Not on file     Review of Systems: General: negative for chills, fever, night sweats or weight changes.  Cardiovascular: negative for chest pain, dyspnea on exertion, edema, orthopnea, palpitations, paroxysmal nocturnal dyspnea or shortness of breath Dermatological: negative for rash Respiratory: negative for cough or wheezing Urologic: negative for hematuria Abdominal: negative for nausea, vomiting, diarrhea, bright red blood per rectum, melena, or hematemesis Neurologic: negative for visual changes, syncope, or  dizziness All other systems reviewed and are otherwise negative except as noted above.    Blood pressure 118/68, pulse 67, height '5\' 3"'$  (1.6 m), weight 217 lb (98.4 kg).  General appearance: alert and no distress Neck: no adenopathy, no carotid bruit, no JVD, supple, symmetrical, trachea midline, and thyroid not enlarged, symmetric, no tenderness/mass/nodules Lungs: clear to auscultation bilaterally Heart: regular rate and rhythm, S1, S2 normal, no murmur, click, rub or gallop Extremities: 2+ pitting edema bilaterally. Pulses: 2+ and symmetric Skin: Skin color, texture, turgor normal. No rashes or lesions Neurologic: Grossly normal  EKG not performed today  ASSESSMENT AND PLAN:   Essential hypertension History of essential hypertension a blood pressure measured today at  118/68.  He is on metoprolol and Dyazide.  Dyslipidemia History of dyslipidemia on atorvastatin and Zetia with lipid profile performed 01/23/2022 revealing total cholesterol 147, LDL 71 and HDL 30.  Cigarette smoker History of greater than 100-pack-year tobacco abuse smoking 3 packs a day since he was a teenager, currently smoking less than 1 pack a day for last several years.  He does have COPD.     Lorretta Harp MD FACP,FACC,FAHA, [facility] 01/31/2022 9:38 AM

## 2022-01-31 NOTE — Assessment & Plan Note (Signed)
History of essential hypertension a blood pressure measured today at [facility].  He is on metoprolol and Dyazide.

## 2022-02-03 ENCOUNTER — Ambulatory Visit: Payer: Medicaid Other | Admitting: Internal Medicine

## 2022-02-03 ENCOUNTER — Encounter: Payer: Self-pay | Admitting: Orthopedic Surgery

## 2022-02-03 ENCOUNTER — Ambulatory Visit (INDEPENDENT_AMBULATORY_CARE_PROVIDER_SITE_OTHER): Payer: Medicaid Other | Admitting: Orthopedic Surgery

## 2022-02-03 VITALS — Ht 63.0 in | Wt 217.0 lb

## 2022-02-03 DIAGNOSIS — M79609 Pain in unspecified limb: Secondary | ICD-10-CM | POA: Diagnosis not present

## 2022-02-03 DIAGNOSIS — M542 Cervicalgia: Secondary | ICD-10-CM | POA: Diagnosis not present

## 2022-02-03 DIAGNOSIS — G8929 Other chronic pain: Secondary | ICD-10-CM | POA: Diagnosis not present

## 2022-02-03 DIAGNOSIS — R202 Paresthesia of skin: Secondary | ICD-10-CM | POA: Diagnosis not present

## 2022-02-03 NOTE — Progress Notes (Signed)
Return Patient Visit  Assessment: Jerry Aguirre is a 57 y.o. male with the following: Right shoulder pain Cervical radiculopathy, with numbness and weakness in the right upper extremity; ? myelopathy  Plan: Jerry Aguirre continues to have symptoms in his right arm.  Onset of pain, [name] or atraumatic.  He states he woke up with this deformity.  He has some improvement in the symptoms in his right shoulder, and is gradually getting better.  However, he continues to have numbness and weakness in the right upper extremity.  Recent MRI of the cervical spine is without areas of severe stenosis.  However, upon my review, he has a congenitally narrow canal.  He states that he has issues with falling and his balance is poor.  He has difficulty with fine motor movements.  He has bilateral Hoffmann's on physical exam.  I remain concerned that he has some pathology within the cervical spine, and would like for him to be evaluated by Dr. Laurance Flatten.  A referral was placed today.  In regards to his right shoulder, he is demonstrating some improvement in his symptoms.  I certainly hope that this will continue.  If it is determined that he does not have issues with his neck, I am happy to continue following him for his right shoulder.  He will continue with light activities.  He will follow-up with me for his right shoulder as needed.   Follow-up: Return for Referral to see Dr. Laurance Flatten for further evaluation of your cervical spine.  Subjective:  Chief Complaint  Patient presents with   Neck Pain    Here for MRI results    History of Present Illness: Jerry Aguirre is a 57 y.o. male who returns to clinic for repeat evaluation of his right shoulder.  At the last clinic visit, I recommended a cervical spine MRI.  He has completed the MRI, and is here to discuss the results.  He continues to have pain in the right shoulder.  He notes his motion is getting better, but he still has a lot of weakness.   Atraumatic onset.  He states that he woke up 1 morning, and noticed a [name] of the right upper extremity.  Since then, he has had this pain.  He also continues to have numbness and tingling throughout the right hand.  Throughout the day, he notes progressively worsening pain in his neck.  He reports that his balance is very poor.  He is unable to hold items in his right hand for very long.  He also has numbness and tingling in both hands.  He has been taking Flexeril, with some improvement in his symptoms.   Review of Systems: No fevers or chills + numbness & tingling No chest pain No shortness of breath No bowel or bladder dysfunction No GI distress No headaches      Objective: Ht '5\' 3"'$  (1.6 m)   Wt 217 lb (98.4 kg)   BMI 38.44 kg/m   Physical Exam:  General: Alert and oriented. and No acute distress. Gait: Ambulates with the assistance of a cane.  Right shoulder tenderness to palpation over the anterior shoulder.  Forward flexion to 120 degrees.  Internal Tatian to his back pocket.  [name].  Positive Jobe's.  Positive drop arm test.  Negative Yergason's.  Numbness throughout the right hand.  Weakness throughout the right upper extremity.  Positive Hoffmann's bilaterally.  4/5 grip strength.    IMAGING: I personally reviewed images previously obtained in clinic  Cervical spine MRI  IMPRESSION: 1. Moderately motion degraded examination. 2. No acute findings or clear explanation for the patient's symptoms. 3. Mild cervical spondylosis with disc bulging and uncinate spurring as described, similar to previous study. No significant central spinal stenosis. Moderate foraminal narrowing again suggested at C5-6, left greater than right. No more than mild foraminal narrowing identified at the additional levels, with detail limited by motion.   New Medications:  No orders of the defined types were placed in this encounter.     Mordecai Rasmussen,  MD  02/03/2022 8:51 AM

## 2022-02-07 ENCOUNTER — Ambulatory Visit (INDEPENDENT_AMBULATORY_CARE_PROVIDER_SITE_OTHER): Payer: Medicaid Other

## 2022-02-07 ENCOUNTER — Encounter: Payer: Self-pay | Admitting: Orthopedic Surgery

## 2022-02-07 ENCOUNTER — Other Ambulatory Visit: Payer: Self-pay | Admitting: Internal Medicine

## 2022-02-07 ENCOUNTER — Ambulatory Visit (INDEPENDENT_AMBULATORY_CARE_PROVIDER_SITE_OTHER): Payer: Medicaid Other | Admitting: Orthopedic Surgery

## 2022-02-07 ENCOUNTER — Other Ambulatory Visit: Payer: Self-pay | Admitting: Orthopedic Surgery

## 2022-02-07 VITALS — BP 125/76 | HR 96 | Ht 63.0 in | Wt 217.0 lb

## 2022-02-07 DIAGNOSIS — G5602 Carpal tunnel syndrome, left upper limb: Secondary | ICD-10-CM

## 2022-02-07 DIAGNOSIS — M79609 Pain in unspecified limb: Secondary | ICD-10-CM

## 2022-02-07 DIAGNOSIS — R202 Paresthesia of skin: Secondary | ICD-10-CM | POA: Diagnosis not present

## 2022-02-07 DIAGNOSIS — M25511 Pain in right shoulder: Secondary | ICD-10-CM | POA: Diagnosis not present

## 2022-02-07 DIAGNOSIS — G8929 Other chronic pain: Secondary | ICD-10-CM

## 2022-02-07 DIAGNOSIS — M542 Cervicalgia: Secondary | ICD-10-CM

## 2022-02-07 DIAGNOSIS — G5601 Carpal tunnel syndrome, right upper limb: Secondary | ICD-10-CM

## 2022-02-07 DIAGNOSIS — M5104 Intervertebral disc disorders with myelopathy, thoracic region: Secondary | ICD-10-CM

## 2022-02-07 NOTE — Progress Notes (Addendum)
Orthopedic Spine Surgery Office Note  Assessment: Patient is a 57 y.o. male with multiple issues that he wanted discuss  Bilateral numbness/paresthesias/pain into the lower extremities in all distributions.  Also reports significant imbalance and is using a cane due to his instability.  Had prior L4-5 decompression with no relief.  Possible thoracic myelopathy Bilateral hand numbness and tingling that had improved with brace use.  Has provocative findings of carpal tunnel bilaterally.  Bilateral carpal tunnel syndrome Neck stiffness and pain that is felt in the anterior shoulder.  Has paresthesias and pain distal to the elbow in all distributions   Plan: -Patient has multiple complaints.  I do not think that all of his complaints are explained by 1 diagnosis.  I am going to work him up further with an EMG/NCS given his significant numbness and tingling in multiple distributions and all 4 extremities.  I also am going to get an MRI of his thoracic spines that he is complaining of significant pain radiating to the legs and imbalance.  He is not having any hand dexterity issues and his cervical spine MRI although nondiagnostic was not concerning for central stenosis -Since he received some relief with bilateral wrist braces for his carpal tunnel, new braces were prescribed him today -Explained to patient that if we ever decide on surgery, I would want a new cervical spine MRI because of the artifact makes his scan difficult to interpret and I would want to have all the detail I need prior to surgery -I explained to him that he would need to be completely nicotine free before any elective surgery -Patient should return to office in 5 weeks, x-rays at next visit: Thoracic AP and lateral   Patient expressed understanding of the plan and all questions were answered to the patient's satisfaction.   ___________________________________________________________________________   History:  Patient is a 57  y.o. male who presents today for several issues.  First, he comes in to talk about neck stiffness.  He feels that his neck is stiff in all directions.  He states he occasionally gets some pain in his neck but the bigger issue is that he is not able to move it quickly used to.  He states then within the last 6 months he has developed pain that is in the anterior aspect of the shoulder and developed a [name] to his right arm.  He also notes around the time of the shoulder symptoms, developing pain and paresthesias that radiate from his right elbow down to all of his fingertips.  He feels it in all distributions.  He also has paresthesias on the left side that radiate from the wrist down in all the fingertips but it is not as significant as the right side.  He drops objects with the right side due to the paresthesias and numbness.  He does not have the same symptoms on the left side.  Patient also wants to talk about his low back.  He has severe low back pain that has been chronic in nature.  He underwent a L4-5 decompression with Dr. Marcello Aguirre in April 2023.  He states that he had bilateral paresthesias and pain radiating down his legs in all distributions.  He also had significant issues with unsteadiness.  He had been using a cane because of his instability with gait.  He has had several falls.  He states that he got no better with his decompression surgery.   Weakness: Denies Difficulty with fine motor skills (e.g., buttoning shirts,  handwriting): Yes, with the right hand but no symptoms on the left side Symptoms of imbalance: Yes, significant and has been getting worse.  Is not using a cane and has had several falls Paresthesias and numbness: Yes, bilateral lower extremities in all distributions.  Right upper extremity distal to the elbow in all distributions.  In the left upper extremity distal to the wrist into all the fingertips Bowel or bladder incontinence: Denies Saddle anesthesia:  Denies  Treatments tried: L4-5 decompression surgery, Lyrica, right shoulder injection, wrist braces, gabapentin  Review of systems: Denies fevers and chills, night sweats, unexplained weight loss, history of cancer. Has pain that wakes him at night  Past medical history: COPD GERD HLD HTN Sleep apnea Migraines Chronic pain  Allergies: lisinopril, Vicodin, Lopressor  Past surgical history:  Hernia repair Polypectomy Tonsillectomy L4/5 bilateral laminotomies, foraminotomies, and medial facetectomies  Social history: Reports use of nicotine product (smoking, vaping, patches, smokeless) Alcohol use: denies Denies recreational drug use  Physical Exam:  General: no acute distress, appears stated age Neurologic: alert, answering questions appropriately, following commands Respiratory: unlabored breathing on room air, symmetric chest rise Psychiatric: appropriate affect, normal cadence to speech   MSK (spine):  -Strength exam      Left  Right Grip strength               5/5  5/5 Interosseus   4/5   4/5 Wrist extension  5/5  5/5 Wrist flexion   5/5  5/5 Elbow flexion   5/5  5/5 Deltoid    5/5  5/5  EHL    4/5  4/5 TA    5/5  5/5 GSC    5/5  5/5 Knee extension  5/5  5/5 Hip flexion   5/5  5/5  -Sensory exam    Sensation intact to light touch in L3-S1 nerve distributions of bilateral lower extremities  Sensation intact to light touch in C5-T1 nerve distributions of bilateral upper extremities  -Brachioradialis DTR: 1/4 on the left, 1/4 on the right -Biceps DTR: 1/4 on the left, 1/4 on the right -Achilles DTR: 2/4 on the left, 2/4 on the right -Patellar tendon DTR: 2/4 on the left, 2/4 on the right  -Spurling: Negative bilaterally -Hoffman sign: Negative bilaterally -Clonus: No beats bilaterally -Interosseous wasting: None seen -Grip and release test: Negative -Romberg: Negative -Gait: Wide-based -Imbalance with tandem gait: Yes -Unable to perform heel or  toe walking due to instability  Left shoulder exam: No pain to range of motion, negative Jobe test, negative drop arm sign, negative belly press, no weakness with external rotation with arm at side Right shoulder exam: Pain with Jobe test, no pain through range of motion, negative drop arm sign, no weakness with external rotation with arm at side, negative belly press, tender to palpation over the bicipital groove, [name] noted  Tinel's at wrist: Positive bilaterally Phalen's at wrist: Negative bilaterally Durkan's: Positive on the right, negative on the left  Tinel's at elbow: Negative on the right, positive on the left  Imaging: XR of the cervical spine from 02/07/2022 and 01/23/2022 was independently reviewed and interpreted, showing no significant disc at loss.  No fracture desiccation.  No evidence of instability on flexion/extension views.  MRI of the cervical spine from 01/31/2022 was independently reviewed and interpreted, showing possible C5-6 bilateral foraminal stenosis and possible right-sided foraminal stenosis at C6-7.  Study is significantly limited by artifact.  Cannot see normal level of detail of the foramen on the axial  cuts.   Patient name: Jerry Aguirre Patient MRN: [id number] Date of visit: 02/07/22

## 2022-02-08 ENCOUNTER — Ambulatory Visit (INDEPENDENT_AMBULATORY_CARE_PROVIDER_SITE_OTHER): Payer: Medicaid Other | Admitting: Pulmonary Disease

## 2022-02-08 ENCOUNTER — Encounter: Payer: Self-pay | Admitting: Pulmonary Disease

## 2022-02-08 VITALS — BP 126/84 | HR 94 | Temp 97.6°F | Ht 63.0 in | Wt 217.8 lb

## 2022-02-08 DIAGNOSIS — J4489 Other specified chronic obstructive pulmonary disease: Secondary | ICD-10-CM | POA: Diagnosis not present

## 2022-02-08 DIAGNOSIS — G4736 Sleep related hypoventilation in conditions classified elsewhere: Secondary | ICD-10-CM

## 2022-02-08 DIAGNOSIS — G4733 Obstructive sleep apnea (adult) (pediatric): Secondary | ICD-10-CM

## 2022-02-08 NOTE — Patient Instructions (Signed)
  X split night study  Continue using oxygen & fluid pills
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
## 2022-02-08 NOTE — Assessment & Plan Note (Signed)
Nocturnal hypoxia seems to be on the basis of COPD We will assess during the sleep study

## 2022-02-09 ENCOUNTER — Ambulatory Visit (HOSPITAL_COMMUNITY): Admission: RE | Admit: 2022-02-09 | Payer: Medicaid Other | Source: Ambulatory Visit

## 2022-02-09 ENCOUNTER — Encounter (HOSPITAL_COMMUNITY): Payer: Self-pay

## 2022-02-10 ENCOUNTER — Ambulatory Visit (HOSPITAL_COMMUNITY)
Admission: RE | Admit: 2022-02-10 | Discharge: 2022-02-10 | Disposition: A | Discharge status: Home / Self Care | Payer: Medicaid Other | Source: Ambulatory Visit | Attending: Cardiovascular Disease | Admitting: Cardiovascular Disease

## 2022-02-10 ENCOUNTER — Other Ambulatory Visit: Payer: Self-pay

## 2022-02-10 DIAGNOSIS — E785 Hyperlipidemia, unspecified: Secondary | ICD-10-CM | POA: Diagnosis present

## 2022-02-10 DIAGNOSIS — I10 Essential (primary) hypertension: Secondary | ICD-10-CM | POA: Diagnosis present

## 2022-02-10 DIAGNOSIS — R202 Paresthesia of skin: Secondary | ICD-10-CM

## 2022-02-10 DIAGNOSIS — Z01810 Encounter for preprocedural cardiovascular examination: Secondary | ICD-10-CM | POA: Diagnosis present

## 2022-02-10 LAB — ECHOCARDIOGRAM COMPLETE
AR max vel: 3.81 cm2
AV Area VTI: 4.03 cm2
AV Area mean vel: 3.94 cm2
AV Mean grad: 4 mmHg
AV Peak grad: 8.2 mmHg
Ao pk vel: 1.43 m/s
Area-P 1/2: 3.6 cm2
S' Lateral: 2.6 cm

## 2022-02-10 NOTE — Progress Notes (Signed)
*  PRELIMINARY RESULTS* Echocardiogram 2D Echocardiogram has been performed.  Jerry Aguirre 02/10/2022, 9:14 AM

## 2022-02-13 ENCOUNTER — Ambulatory Visit: Payer: Medicaid Other | Admitting: Orthopedic Surgery

## 2022-02-15 ENCOUNTER — Telehealth: Payer: Self-pay | Admitting: Pulmonary Disease

## 2022-02-15 NOTE — Telephone Encounter (Signed)
Sleep study has been changed to 3/6 by the pt as he has surgery 2/26 which is the next avail for [place], pt did not want to come to [place] for a sooner appt. Has been added to the cancellation list.   FYI

## 2022-02-20 ENCOUNTER — Other Ambulatory Visit: Payer: Self-pay | Admitting: Orthopedic Surgery

## 2022-02-23 ENCOUNTER — Ambulatory Visit (INDEPENDENT_AMBULATORY_CARE_PROVIDER_SITE_OTHER): Payer: Medicaid Other | Admitting: Neurology

## 2022-02-23 DIAGNOSIS — R202 Paresthesia of skin: Secondary | ICD-10-CM

## 2022-02-23 NOTE — Procedures (Signed)
[facility]  [place], [address]  [place], [place] [address] Tel: [phone] Fax: [phone] Test Date:  02/23/2022  Patient: Jerry Aguirre DOB: 1965/02/11 Physician: Narda Amber, DO  Sex: Male Height: '5\' 3"'$  Ref Phys: Ileene Rubens, MD  ID#: [id number]   Technician:    History: This is a 57 year old man referred for evaluation of generalized paresthesias of the arms and legs.  NCV & EMG Findings: Extensive electrodiagnostic testing of the right upper extremity and additional studies of the left shows:  Bilateral median, ulnar, radial, and mixed palmar studies are within normal limits. Bilateral median and ulnar motor responses are within normal limits. There is no evidence of active or chronic motor axonal loss changes affecting any of the tested muscles.  Motor unit configuration and recruitment pattern is within normal limits.  Impression: This is a normal study of the upper extremities.  In particular, there is no evidence of cervical radiculopathy, neuropathy, carpal tunnel syndrome, or ulnar neuropathy affecting either upper extremity.   ___________________________ Narda Amber, DO    Nerve Conduction Studies   Stim Site NR Peak (ms) Norm Peak (ms) O-P Amp (V) Norm O-P Amp  Left Median Anti Sensory (2nd Digit)  33 C  Wrist    3.5 <3.6 15.6 >15  Right Median Anti Sensory (2nd Digit)  33 C  Wrist    3.0 <3.6 20.9 >15  Left Radial Anti Sensory (Base 1st Digit)  33 C  Wrist    2.2 <2.7 20.0 >14  Right Radial Anti Sensory (Base 1st Digit)  33 C  Wrist    2.0 <2.7 15.2 >14  Left Ulnar Anti Sensory (5th Digit)  33 C  Wrist    3.1 <3.1 10.9 >10  Right Ulnar Anti Sensory (5th Digit)  33 C  Wrist    3.1 <3.1 10.8 >10     Stim Site NR Onset (ms) Norm Onset (ms) O-P Amp (mV) Norm O-P Amp Site1 Site2 Delta-0 (ms) Dist (cm) Vel (m/s) Norm Vel (m/s)  Left Median Motor (Abd Poll Brev)  33 C  Wrist    3.1 <4.0 10.3 >6 Elbow Wrist 6.2 31.0 50 >50   Elbow    9.3  10.1         Right Median Motor (Abd Poll Brev)  33 C  Wrist    3.1 <4.0 9.0 >6 Elbow Wrist 5.9 30.0 51 >50  Elbow    9.0  8.1         Left Ulnar Motor (Abd Dig Minimi)  33 C  Wrist    2.5 <3.1 9.7 >7 B Elbow Wrist 4.3 22.0 51 >50  B Elbow    6.8  9.3  A Elbow B Elbow 2.0 10.0 50 >50  A Elbow    8.8  9.3         Right Ulnar Motor (Abd Dig Minimi)  33 C  Wrist    2.5 <3.1 11.5 >7 B Elbow Wrist 4.0 23.0 58 >50  B Elbow    6.5  11.5  A Elbow B Elbow 1.4 10.0 65 >50  A Elbow    7.9  11.3            Stim Site NR Peak (ms) Norm Peak (ms) P-T Amp (V) Site1 Site2 Delta-P (ms) Norm Delta (ms)  Left Median/Ulnar Palm Comparison (Wrist - 8cm)  33 C  Median Palm    1.9 <2.2 41.6 Median Palm Ulnar Palm 0.0   Ulnar Palm  1.9 <2.2 9.0      Right Median/Ulnar Palm Comparison (Wrist - 8cm)  33 C  Median Palm    2.0 <2.2 56.7 Median Palm Ulnar Palm 0.3   Ulnar Palm    2.3 <2.2 14.9       Electromyography   Side Muscle Ins.Act Fibs Fasc Recrt Amp Dur Poly Activation Comment  Left 1stDorInt Nml Nml Nml Nml Nml Nml Nml Nml N/A  Left Abd Poll Brev Nml Nml Nml Nml Nml Nml Nml Nml N/A  Left PronatorTeres Nml Nml Nml Nml Nml Nml Nml Nml N/A  Left Biceps Nml Nml Nml Nml Nml Nml Nml Nml N/A  Left Triceps Nml Nml Nml Nml Nml Nml Nml Nml N/A  Left Deltoid Nml Nml Nml Nml Nml Nml Nml Nml N/A  Right 1stDorInt Nml Nml Nml Nml Nml Nml Nml Nml N/A  Right Abd Poll Brev Nml Nml Nml Nml Nml Nml Nml Nml N/A  Right PronatorTeres Nml Nml Nml Nml Nml Nml Nml Nml N/A  Right Biceps Nml Nml Nml Nml Nml Nml Nml Nml N/A  Right Triceps Nml Nml Nml Nml Nml Nml Nml Nml N/A    Waveforms:

## 2022-02-24 ENCOUNTER — Ambulatory Visit (HOSPITAL_COMMUNITY): Payer: Medicaid Other

## 2022-02-28 ENCOUNTER — Encounter: Payer: Self-pay | Admitting: Internal Medicine

## 2022-02-28 ENCOUNTER — Ambulatory Visit (INDEPENDENT_AMBULATORY_CARE_PROVIDER_SITE_OTHER): Payer: Medicaid Other | Admitting: Internal Medicine

## 2022-02-28 ENCOUNTER — Other Ambulatory Visit: Payer: Self-pay | Admitting: Internal Medicine

## 2022-02-28 VITALS — BP 117/73 | HR 87 | Ht 64.0 in | Wt 215.4 lb

## 2022-02-28 DIAGNOSIS — E785 Hyperlipidemia, unspecified: Secondary | ICD-10-CM

## 2022-02-28 DIAGNOSIS — E119 Type 2 diabetes mellitus without complications: Secondary | ICD-10-CM

## 2022-02-28 DIAGNOSIS — E538 Deficiency of other specified B group vitamins: Secondary | ICD-10-CM | POA: Diagnosis not present

## 2022-02-28 DIAGNOSIS — E876 Hypokalemia: Secondary | ICD-10-CM | POA: Diagnosis not present

## 2022-02-28 DIAGNOSIS — K22711 Barrett's esophagus with high grade dysplasia: Secondary | ICD-10-CM

## 2022-02-28 DIAGNOSIS — R7989 Other specified abnormal findings of blood chemistry: Secondary | ICD-10-CM

## 2022-02-28 MED ORDER — ATORVASTATIN CALCIUM 40 MG PO TABS: 40.0000 mg | ORAL_TABLET | Freq: Every day | ORAL | 0 refills | Status: DC

## 2022-02-28 MED ORDER — OMEPRAZOLE 40 MG PO CPDR: 40.0000 mg | DELAYED_RELEASE_CAPSULE | Freq: Every day | ORAL | 3 refills | Status: DC

## 2022-02-28 MED ORDER — LANCET DEVICE MISC: 1.0000 | Freq: Three times a day (TID) | 0 refills | Status: AC

## 2022-02-28 MED ORDER — LANCETS MISC. MISC: 1.0000 | Freq: Three times a day (TID) | 0 refills | Status: AC

## 2022-02-28 MED ORDER — METFORMIN HCL ER 500 MG PO TB24: 500.0000 mg | ORAL_TABLET | Freq: Every day | ORAL | 2 refills | Status: DC

## 2022-02-28 MED ORDER — BACLOFEN 10 MG PO TABS: 10.0000 mg | ORAL_TABLET | Freq: Two times a day (BID) | ORAL | 0 refills | Status: DC | PRN

## 2022-02-28 MED ORDER — BLOOD GLUCOSE TEST VI STRP: 1.0000 | ORAL_STRIP | Freq: Three times a day (TID) | 0 refills | Status: DC

## 2022-02-28 MED ORDER — CYANOCOBALAMIN 1000 MCG/ML IJ SOLN
1000.0000 ug | Freq: Once | INTRAMUSCULAR | Status: AC
  Administered 2022-02-28: 1000 ug via INTRAMUSCULAR

## 2022-02-28 MED ORDER — BLOOD GLUCOSE MONITORING SUPPL DEVI: 1.0000 | Freq: Three times a day (TID) | 0 refills | Status: DC

## 2022-02-28 NOTE — Patient Instructions (Signed)
It was a pleasure to see you today.  Thank you for giving [name] the opportunity to be involved in your care.  Below is a brief recap of your visit and next steps.  We will plan to see you again in 1 month  Summary Start metformin xr 500 mg daily Checking urine studies today as well as chemistry panel to monitor your potassium level Follow up in 1 month

## 2022-02-28 NOTE — Progress Notes (Signed)
Established Patient Office Visit  Subjective   Patient ID: Jerry Aguirre, male    DOB: Feb 12, 1965  Age: 57 y.o. MRN: [id number]  Chief Complaint  Patient presents with   Hemoglobin A1c Screening    A1c was high at cardiologist and wants to follow up about it.    Jerry Aguirre returns to care today.  He was last seen by me on 1/8 for routine follow-up.  In the interim he has been seen by gastroenterology [facility] for follow-up of Barrett's esophagus with high-grade dysplasia.  He has also been seen by pulmonology and cardiology.  Screening labs performed by cardiology at [facility] are notable for a hemoglobin A1c of 6.6, meeting criteria for diabetes.  He has called to schedule an appointment today in light of these results.  Jerry Aguirre has no additional concerns to discuss today.  He continues to endorse dyspnea with exertion, bilateral lower extreme edema, and abdominal distention.  Past Medical History:  Diagnosis Date   COPD (chronic obstructive pulmonary disease) (HCC)    Dyspnea    GERD (gastroesophageal reflux disease)    Headache    Hyperlipidemia    Hypertension    Sleep apnea    pt says his "OSA was mild and they said he did not need a CPAP"   Past Surgical History:  Procedure Laterality Date   BIOPSY  12/22/2021   Procedure: BIOPSY;  Surgeon: Harvel Quale, MD;  [facility];  Service: Gastroenterology;;   COLONOSCOPY WITH PROPOFOL N/A 12/22/2021   Procedure: COLONOSCOPY WITH PROPOFOL;  Surgeon: Harvel Quale, MD;  [facility];  Service: Gastroenterology;  Laterality: N/A;  10:15am, asa 3   ESOPHAGOGASTRODUODENOSCOPY (EGD) WITH PROPOFOL N/A 12/22/2021   Procedure: ESOPHAGOGASTRODUODENOSCOPY (EGD) WITH PROPOFOL;  Surgeon: Harvel Quale, MD;  [facility];  Service: Gastroenterology;  Laterality: N/A;   HERNIA REPAIR Right    inguinal   LUMBAR LAMINECTOMY/DECOMPRESSION MICRODISCECTOMY N/A 04/21/2021   Procedure:  LAMINECTOMY, MEDIAL FACETECTOMY LUMBAR FOUR- FIVE;  Surgeon: Vallarie Mare, MD;  Location: [place];  Service: Neurosurgery;  Laterality: N/A;   POLYPECTOMY  12/22/2021   Procedure: POLYPECTOMY;  Surgeon: Harvel Quale, MD;  [facility];  Service: Gastroenterology;;   TONSILLECTOMY     removed as a child   Social History   Tobacco Use   Smoking status: Every Day    Packs/day: 1.00    Types: Cigarettes    Passive exposure: Current   Smokeless tobacco: Never   Tobacco comments:    Less than 1 pack per day  Vaping Use   Vaping Use: Never used  Substance Use Topics   Alcohol use: Not Currently   Drug use: Yes    Types: Marijuana    Comment: twice a week   Family History  Problem Relation Age of Onset   Esophageal cancer Mother    Lung cancer Father    Allergies  Allergen Reactions   Lisinopril Itching    headaches   Review of Systems  Constitutional:  Positive for malaise/fatigue.  Respiratory:  Positive for shortness of breath.   Cardiovascular:  Positive for leg swelling.  Gastrointestinal:  Positive for abdominal pain (abdominal distention).     Objective:     BP 117/73   Pulse 87   Ht 5' 4"$  (1.626 m)   Wt 215 lb 6.4 oz (97.7 kg)   SpO2 90%   BMI 36.97 kg/m  BP Readings from Last 3 Encounters:  02/28/22 117/73  02/08/22 126/84  02/07/22 125/76   Physical Exam Vitals reviewed.  Constitutional:      General: He is not in acute distress.    Appearance: Normal appearance. He is obese. He is not ill-appearing.  HENT:     Head: Normocephalic and atraumatic.     Right Ear: External ear normal.     Left Ear: External ear normal.     Nose: Nose normal. No congestion or rhinorrhea.     Mouth/Throat:     Mouth: Mucous membranes are moist.     Pharynx: Oropharynx is clear.  Eyes:     Extraocular Movements: Extraocular movements intact.     Conjunctiva/sclera: Conjunctivae normal.     Pupils: Pupils are equal, round, and reactive to  light.  Cardiovascular:     Rate and Rhythm: Normal rate and regular rhythm.     Pulses: Normal pulses.     Heart sounds: Normal heart sounds. No murmur heard. Pulmonary:     Effort: Pulmonary effort is normal.     Breath sounds: No wheezing, rhonchi or rales.  Abdominal:     General: Bowel sounds are normal. There is distension.     Tenderness: There is no abdominal tenderness.  Musculoskeletal:        General: No swelling or deformity. Normal range of motion.     Cervical back: Normal range of motion.     Right lower leg: Edema (2+) present.     Left lower leg: Edema (2+) present.  Skin:    General: Skin is warm and dry.     Capillary Refill: Capillary refill takes less than 2 seconds.  Neurological:     General: No focal deficit present.     Mental Status: He is alert and oriented to person, place, and time.     Motor: No weakness.  Psychiatric:        Mood and Affect: Mood normal.        Behavior: Behavior normal.        Thought Content: Thought content normal.   Last CBC Lab Results  Component Value Date   WBC 8.8 11/20/2021   HGB 14.5 11/20/2021   HCT 43.7 11/20/2021   MCV 90.5 11/20/2021   MCH 30.0 11/20/2021   RDW 13.9 11/20/2021   PLT 292 [id number]   Last metabolic panel Lab Results  Component Value Date   GLUCOSE 101 (H) 02/28/2022   NA 143 02/28/2022   K 4.0 02/28/2022   CL 99 02/28/2022   CO2 25 02/28/2022   BUN 12 02/28/2022   CREATININE 1.20 02/28/2022   EGFR 71 02/28/2022   CALCIUM 10.2 02/28/2022   PROT 7.4 12/13/2021   ALBUMIN 4.6 12/13/2021   LABGLOB 2.8 12/13/2021   AGRATIO 1.6 12/13/2021   BILITOT 0.8 12/13/2021   ALKPHOS 102 12/13/2021   AST 34 12/13/2021   ALT 45 (H) 12/13/2021   ANIONGAP 8 11/20/2021   Last lipids Lab Results  Component Value Date   CHOL 147 01/23/2022   HDL 30 (L) 01/23/2022   LDLCALC 71 01/23/2022   TRIG 282 (H) 01/23/2022   CHOLHDL 4.9 01/23/2022   Last hemoglobin A1c Lab Results  Component Value Date    HGBA1C 6.2 (H) 09/22/2021   Last thyroid functions Lab Results  Component Value Date   TSH 1.410 09/13/2021   Last vitamin D Lab Results  Component Value Date   VD25OH 13.3 (L) 01/23/2022   Last vitamin B12 and Folate Lab Results  Component Value Date   VITAMINB12 272 09/22/2021   FOLATE >20.0 09/22/2021     Assessment & Plan:   Problem List Items Addressed This Visit       Barrett's esophagus with high grade dysplasia    Noted on EGD in December 2023.  He has established care at [facility] for esophageal diseases and swallowing.  He will undergo EGD with resection on 2/26.      Type 2 diabetes mellitus without complications ([place]) - Primary    New diagnosis.  Meeting criteria with A1c 6.6 on labs from earlier this month.  Currently denies symptoms of polyuria/polydipsia. -Start metformin XR 500 mg daily -Urine microalbumin/creatinine ratio ordered today -Follow-up in 1 month      Hypokalemia    Noted on recent labs.  Treated with brief course of potassium supplementation. -Repeat chemistry panel ordered today      Low vitamin B12 level    B12 level low-normal recently.  He has requested a B12 injection today. -Vitamin B12 injection administered      Return in about 1 month (around 03/29/2022).   Johnette Abraham, MD

## 2022-03-02 ENCOUNTER — Encounter: Payer: Medicaid Other | Admitting: Neurology

## 2022-03-02 ENCOUNTER — Encounter: Payer: Self-pay | Admitting: Internal Medicine

## 2022-03-02 LAB — MICROALBUMIN / CREATININE URINE RATIO
Creatinine, Urine: 173.7 mg/dL
Microalb/Creat Ratio: 4 mg/g creat (ref 0–29)
Microalbumin, Urine: 7.4 ug/mL

## 2022-03-02 LAB — BASIC METABOLIC PANEL
BUN/Creatinine Ratio: 10 (ref 9–20)
BUN: 12 mg/dL (ref 6–24)
CO2: 25 mmol/L (ref 20–29)
Calcium: 10.2 mg/dL (ref 8.7–10.2)
Chloride: 99 mmol/L (ref 96–106)
Creatinine, Ser: 1.2 mg/dL (ref 0.76–1.27)
Glucose: 101 mg/dL — ABNORMAL HIGH (ref 70–99)
Potassium: 4 mmol/L (ref 3.5–5.2)
Sodium: 143 mmol/L (ref 134–144)
eGFR: 71 mL/min/{1.73_m2} (ref >59)

## 2022-03-06 ENCOUNTER — Other Ambulatory Visit: Payer: Self-pay | Admitting: Orthopedic Surgery

## 2022-03-06 ENCOUNTER — Other Ambulatory Visit: Payer: Self-pay | Admitting: Internal Medicine

## 2022-03-06 DIAGNOSIS — E876 Hypokalemia: Secondary | ICD-10-CM | POA: Insufficient documentation

## 2022-03-06 DIAGNOSIS — R7989 Other specified abnormal findings of blood chemistry: Secondary | ICD-10-CM | POA: Insufficient documentation

## 2022-03-06 DIAGNOSIS — E119 Type 2 diabetes mellitus without complications: Secondary | ICD-10-CM | POA: Insufficient documentation

## 2022-03-06 NOTE — Assessment & Plan Note (Signed)
B12 level low-normal recently.  He has requested a B12 injection today. -Vitamin B12 injection administered

## 2022-03-06 NOTE — Assessment & Plan Note (Signed)
Noted on EGD in December 2023.  He has established care at [facility] for esophageal diseases and swallowing.  He will undergo EGD with resection on 2/26.

## 2022-03-06 NOTE — Assessment & Plan Note (Signed)
New diagnosis.  Meeting criteria with A1c 6.6 on labs from earlier this month.  Currently denies symptoms of polyuria/polydipsia. -Start metformin XR 500 mg daily -Urine microalbumin/creatinine ratio ordered today -Follow-up in 1 month

## 2022-03-06 NOTE — Assessment & Plan Note (Signed)
Noted on recent labs.  Treated with brief course of potassium supplementation. -Repeat chemistry panel ordered today

## 2022-03-09 ENCOUNTER — Ambulatory Visit (INDEPENDENT_AMBULATORY_CARE_PROVIDER_SITE_OTHER): Payer: Medicaid Other | Admitting: Neurology

## 2022-03-09 ENCOUNTER — Encounter: Payer: Self-pay | Admitting: Internal Medicine

## 2022-03-09 ENCOUNTER — Other Ambulatory Visit: Payer: Self-pay

## 2022-03-09 DIAGNOSIS — R202 Paresthesia of skin: Secondary | ICD-10-CM | POA: Diagnosis not present

## 2022-03-09 DIAGNOSIS — K22711 Barrett's esophagus with high grade dysplasia: Secondary | ICD-10-CM

## 2022-03-09 MED ORDER — OMEPRAZOLE 40 MG PO CPDR: 40.0000 mg | DELAYED_RELEASE_CAPSULE | Freq: Every day | ORAL | 3 refills | Status: DC

## 2022-03-09 NOTE — Procedures (Signed)
[facility]  [address], [address] [place]  [place], [place] [address] Tel: [phone] Fax: [phone] Test Date:  03/09/2022  Patient: Jerry Aguirre DOB: 02-Jun-1965 Physician: Narda Amber, DO  Sex: Male Height: 5' 3"$  Ref Phys: Ileene Rubens, MD  ID#: [id number]   Technician:    History:   NCV & EMG Findings: Electrodiagnostic testing of the right lower extremity and additional studies of the left shows: Bilateral sural and superficial peroneal sensory responses are within normal limits. Bilateral peroneal and tibial motor responses are within normal limits. Bilateral tibial H reflex studies are mildly prolonged.  In isolate, these findings are of uncertain clinical significance. There is no evidence of active or chronic motor axonal changes affecting any of the tested muscles.  Motor unit configuration and recruitment pattern is within normal limits.   Impression: This is a normal study of the lower extremities.  In particular, there is no evidence of a large fiber sensorimotor polyneuropathy or lumbosacral radiculopathy   ___________________________ Narda Amber, DO    Nerve Conduction Studies   Stim Site NR Peak (ms) Norm Peak (ms) O-P Amp (V) Norm O-P Amp  Left Sup Peroneal Anti Sensory (Ant Lat Mall)  32 C  12 cm    2.4 <4.6 5.4 >4  Right Sup Peroneal Anti Sensory (Ant Lat Mall)  32 C  12 cm    2.8 <4.6 7.3 >4  Left Sural Anti Sensory (Lat Mall)  32 C  Calf    2.8 <4.6 5.7 >4  Right Sural Anti Sensory (Lat Mall)  32 C  Calf    2.1 <4.6 7.1 >4     Stim Site NR Onset (ms) Norm Onset (ms) O-P Amp (mV) Norm O-P Amp Site1 Site2 Delta-0 (ms) Dist (cm) Vel (m/s) Norm Vel (m/s)  Left Peroneal Motor (Ext Dig Brev)  32 C  Ankle    3.8 <6.0 3.3 >2.5 B Fib Ankle 7.8 35.0 45 >40  B Fib    11.6  3.1  Poplt B Fib 1.6 8.0 50 >40  Poplt    13.2  3.1         Right Peroneal Motor (Ext Dig Brev)  32 C  Ankle    4.9 <6.0 3.9 >2.5 B Fib Ankle 7.0 31.0 44 >40  B  Fib    11.9  3.3  Poplt B Fib 1.9 8.0 42 >40  Poplt    13.8  3.1         Left Tibial Motor (Abd Hall Brev)  32 C  Ankle    3.4 <6.0 12.7 >4 Knee Ankle 9.6 40.0 42 >40  Knee    13.0  9.4         Right Tibial Motor (Abd Hall Brev)  32 C  Ankle    3.4 <6.0 12.4 >4 Knee Ankle 9.4 40.0 43 >40  Knee    12.8  8.8          Electromyography   Side Muscle Ins.Act Fibs Fasc Recrt Amp Dur Poly Activation Comment  Right GluteusMed Nml Nml Nml Nml Nml Nml Nml Nml N/A  Right AntTibialis Nml Nml Nml Nml Nml Nml Nml Nml N/A  Right Gastroc Nml Nml Nml Nml Nml Nml Nml Nml N/A  Right BicepsFemS Nml Nml Nml Nml Nml Nml Nml Nml N/A  Right RectFemoris Nml Nml Nml Nml Nml Nml Nml Nml N/A  Right Flex Dig Long Nml Nml Nml Nml Nml Nml Nml Nml N/A  Left AntTibialis Nml Nml  Nml Nml Nml Nml Nml Nml N/A  Left Flex Dig Long Nml Nml Nml Nml Nml Nml Nml Nml N/A  Left Gastroc Nml Nml Nml Nml Nml Nml Nml Nml N/A  Left RectFemoris Nml Nml Nml Nml Nml Nml Nml Nml N/A  Left GluteusMed Nml Nml Nml Nml Nml Nml Nml Nml N/A      Waveforms:

## 2022-03-14 ENCOUNTER — Encounter: Payer: Medicaid Other | Admitting: Pulmonary Disease

## 2022-03-15 ENCOUNTER — Other Ambulatory Visit: Payer: Self-pay | Admitting: Orthopedic Surgery

## 2022-03-16 ENCOUNTER — Encounter: Payer: Self-pay | Admitting: Radiology

## 2022-03-20 ENCOUNTER — Ambulatory Visit (INDEPENDENT_AMBULATORY_CARE_PROVIDER_SITE_OTHER): Payer: Medicaid Other | Admitting: Gastroenterology

## 2022-03-20 ENCOUNTER — Other Ambulatory Visit: Payer: Self-pay | Admitting: Internal Medicine

## 2022-03-20 MED ORDER — TRIAMTERENE-HCTZ 37.5-25 MG PO CAPS: 1.0000 | ORAL_CAPSULE | Freq: Every day | ORAL | 1 refills | Status: DC

## 2022-03-22 ENCOUNTER — Ambulatory Visit: Payer: Medicaid Other | Attending: Pulmonary Disease | Admitting: Pulmonary Disease

## 2022-03-22 DIAGNOSIS — J4489 Other specified chronic obstructive pulmonary disease: Secondary | ICD-10-CM | POA: Insufficient documentation

## 2022-03-22 DIAGNOSIS — G4733 Obstructive sleep apnea (adult) (pediatric): Secondary | ICD-10-CM

## 2022-03-22 DIAGNOSIS — G4736 Sleep related hypoventilation in conditions classified elsewhere: Secondary | ICD-10-CM | POA: Diagnosis not present

## 2022-03-23 ENCOUNTER — Other Ambulatory Visit: Payer: Self-pay | Admitting: Orthopedic Surgery

## 2022-03-24 ENCOUNTER — Encounter: Payer: Medicaid Other | Admitting: Neurology

## 2022-03-28 ENCOUNTER — Ambulatory Visit: Payer: Medicaid Other | Admitting: Internal Medicine

## 2022-03-29 ENCOUNTER — Telehealth: Payer: Self-pay | Admitting: Pulmonary Disease

## 2022-03-29 DIAGNOSIS — G4733 Obstructive sleep apnea (adult) (pediatric): Secondary | ICD-10-CM

## 2022-03-29 DIAGNOSIS — G4736 Sleep related hypoventilation in conditions classified elsewhere: Secondary | ICD-10-CM

## 2022-03-29 DIAGNOSIS — J4489 Other specified chronic obstructive pulmonary disease: Secondary | ICD-10-CM | POA: Diagnosis not present

## 2022-03-29 NOTE — Telephone Encounter (Signed)
Called and spoke to patients sister (ok per dpr).  She is very upset with patients results.  She states that she has personally witnessed the patients apnea episodes and they are more than 5 times an hour.  She states the patient falls asleep randomly through the day and often falls asleep mid conversation and they are scared for him to drive because of this.  The patients sister is very upset and would like Dr. [place] Soho to be aware and wants to know what else can be done.

## 2022-03-29 NOTE — Procedures (Signed)
Patient Name: Jerry Aguirre, Jerry Aguirre Date: 03/22/2022 Gender: Male D.O.B: 1965/02/09 Age (years): 73 Referring Provider: Kara Mead MD, ABSM Height (inches): 64 Interpreting Physician: Kara Mead MD, ABSM Weight (lbs): 220 RPSGT: Rosebud Poles BMI: 38 MRN: [id number] Neck Size: 18.50 <br> <br> CLINICAL INFORMATION Sleep Study Type: NPSG    Indication for sleep study: Hypertension, reassessment of OSA    Epworth Sleepiness Score: 6     Most recent polysomnogram dated 12/25/2018 revealed an AHI of 3.1/h and RDI of 10.2/h. SLEEP STUDY TECHNIQUE As per the AASM Manual for the Scoring of Sleep and Associated Events v2.3 (April 2016) with a hypopnea requiring 4% desaturations.  The channels recorded and monitored were frontal, central and occipital EEG, electrooculogram (EOG), submentalis EMG (chin), nasal and oral airflow, thoracic and abdominal wall motion, anterior tibialis EMG, snore microphone, electrocardiogram, and pulse oximetry.  MEDICATIONS Medications self-administered by patient taken the night of the study : N/A  SLEEP ARCHITECTURE The study was initiated at 11:02:39 PM and ended at 5:32:15 AM.  Sleep onset time was 5.2 minutes and the sleep efficiency was 69.4%. The total sleep time was 270.5 minutes.  Stage REM latency was 125.5 minutes.  The patient spent 8.87% of the night in stage N1 sleep, 66.17% in stage N2 sleep, 20.33% in stage N3 and 4.6% in REM.  Alpha intrusion was absent.  Supine sleep was 100.00%.  RESPIRATORY PARAMETERS The overall apnea/hypopnea index (AHI) was 5.5 per hour. There were 1 total apneas, including 1 obstructive, 0 central and 0 mixed apneas. There were 24 hypopneas and 0 RERAs.  The AHI during Stage REM sleep was 43.2 per hour.  AHI while supine was 5.5 per hour.  The mean oxygen saturation was 88.19%. The minimum SpO2 during sleep was 84.00%.2 L O2 was added at 4:57 am  loud snoring was noted during this  study.  CARDIAC DATA The 2 lead EKG demonstrated sinus rhythm. The mean heart rate was 73.03 beats per minute. Other EKG findings include: None.   LEG MOVEMENT DATA The total PLMS were 0 with a resulting PLMS index of 0.00. Associated arousal with leg movement index was 0.0 .  IMPRESSIONS - Mild obstructive sleep apnea occurred during this study (AHI = 5.5/h). Events noted during supine sleep - Mild oxygen desaturation was noted during this study (Min O2 = 84.00%). - The patient snored with loud snoring volume. - No cardiac abnormalities were noted during this study. - Clinically significant periodic limb movements did not occur during sleep. No significant associated arousals.   DIAGNOSIS - Very mild Obstructive Sleep Apnea (G47.33) - Nocturnal Hypoxemia (G47.36)   RECOMMENDATIONS - Positional therapy avoiding supine position during sleep should suffice for treatment of Very mild obstructive sleep apnea.  - 2L O2 during sleep - Avoid alcohol, sedatives and other CNS depressants that may worsen sleep apnea and disrupt normal sleep architecture. - Sleep hygiene should be reviewed to assess factors that may improve sleep quality. - Weight management and regular exercise should be initiated or continued if appropriate.    Kara Mead MD Board Certified in [place]

## 2022-03-29 NOTE — Telephone Encounter (Signed)
Tonda requests that we order CPAP and send order to his DME [employer].  She is aware that she will need to call back and schedule f/u for patient after he gets CPAP machine

## 2022-03-29 NOTE — Telephone Encounter (Signed)
Very mild OSA AHI 5.5/h Does not need Rx Continue 2 L O2 during sleep

## 2022-03-31 ENCOUNTER — Other Ambulatory Visit: Payer: Self-pay | Admitting: Internal Medicine

## 2022-04-07 ENCOUNTER — Ambulatory Visit (INDEPENDENT_AMBULATORY_CARE_PROVIDER_SITE_OTHER): Payer: Medicaid Other | Admitting: Internal Medicine

## 2022-04-07 ENCOUNTER — Other Ambulatory Visit: Payer: Self-pay | Admitting: Orthopedic Surgery

## 2022-04-07 ENCOUNTER — Other Ambulatory Visit: Payer: Self-pay | Admitting: Internal Medicine

## 2022-04-07 ENCOUNTER — Encounter: Payer: Self-pay | Admitting: Internal Medicine

## 2022-04-07 VITALS — BP 104/71 | HR 96 | Ht 64.0 in | Wt 207.4 lb

## 2022-04-07 DIAGNOSIS — E119 Type 2 diabetes mellitus without complications: Secondary | ICD-10-CM

## 2022-04-07 DIAGNOSIS — M7918 Myalgia, other site: Secondary | ICD-10-CM | POA: Diagnosis not present

## 2022-04-07 DIAGNOSIS — E876 Hypokalemia: Secondary | ICD-10-CM

## 2022-04-07 DIAGNOSIS — M25551 Pain in right hip: Secondary | ICD-10-CM | POA: Diagnosis not present

## 2022-04-07 DIAGNOSIS — E559 Vitamin D deficiency, unspecified: Secondary | ICD-10-CM

## 2022-04-07 DIAGNOSIS — M48061 Spinal stenosis, lumbar region without neurogenic claudication: Secondary | ICD-10-CM

## 2022-04-07 DIAGNOSIS — J302 Other seasonal allergic rhinitis: Secondary | ICD-10-CM

## 2022-04-07 DIAGNOSIS — G8929 Other chronic pain: Secondary | ICD-10-CM

## 2022-04-07 MED ORDER — PREGABALIN 150 MG PO CAPS: 150.0000 mg | ORAL_CAPSULE | Freq: Every day | ORAL | 0 refills | Status: DC

## 2022-04-07 MED ORDER — AZELASTINE HCL 0.1 % NA SOLN: 1.0000 | Freq: Two times a day (BID) | NASAL | 5 refills | Status: DC

## 2022-04-07 MED ORDER — OXYCODONE-ACETAMINOPHEN 10-325 MG PO TABS: 1.0000 | ORAL_TABLET | Freq: Four times a day (QID) | ORAL | 0 refills | Status: AC | PRN

## 2022-04-07 NOTE — Patient Instructions (Signed)
It was a pleasure to see you today.  Thank you for giving [name] the opportunity to be involved in your care.  Below is a brief recap of your visit and next steps.  We will plan to see you again in 3 months.  Summary Percocet and Lyrica x 30 days prescribed Astelin nasal spray prescribed for allergy relief We will follow up in 3 months Repeat chemistry panel and vitamin D level today

## 2022-04-07 NOTE — Progress Notes (Unsigned)
Established Patient Office Visit  Subjective   Patient ID: Jerry Aguirre, male    DOB: 1965-09-17  Age: 57 y.o. MRN: [id number]  Chief Complaint  Patient presents with   Diabetes    Follow up   Jerry Aguirre returns to care today for 1 month follow-up.  He was last seen by me on 2/13 for follow-up in the setting of a new diagnosis of diabetes mellitus.  Metformin XR 500 mg daily was initiated.  1 month follow-up was arranged.  In the interim, Jerry Aguirre has undergone EGD with mucosal resection on 2/26.  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  No definitive submucosal invasion was identified.  He has also been seen by cardiology for follow-up.  Jerry Aguirre reports feeling fairly well today.  He has not experienced any adverse side effects since starting metformin.  His acute concern today is requesting refills of Percocet and Lyrica as he transitions to a new pain management provider.  He was previously followed by Dr. Merlene Laughter, who has retired, and will soon be establishing care at [facility] in [place] next month.  He will run out of medication before the day of his appointment, and has asked that I fill his medication in the interim.  He additionally endorses seasonal allergies and requests a nasal spray.  Acute concerns, chronic medical conditions, and outside preventative care items discussed today are individually addressed A/P below.  Past Medical History:  Diagnosis Date   COPD (chronic obstructive pulmonary disease) (HCC)    Dyspnea    GERD (gastroesophageal reflux disease)    Headache    Hyperlipidemia    Hypertension    Sleep apnea    pt says his "OSA was mild and they said he did not need a CPAP"   Past Surgical History:  Procedure Laterality Date   BIOPSY  12/22/2021   Procedure: BIOPSY;  Surgeon: Harvel Quale, MD;   [facility];  Service: Gastroenterology;;   COLONOSCOPY WITH PROPOFOL N/A 12/22/2021   Procedure: COLONOSCOPY WITH PROPOFOL;  Surgeon: Harvel Quale, MD;  [facility];  Service: Gastroenterology;  Laterality: N/A;  10:15am, asa 3   ESOPHAGOGASTRODUODENOSCOPY (EGD) WITH PROPOFOL N/A 12/22/2021   Procedure: ESOPHAGOGASTRODUODENOSCOPY (EGD) WITH PROPOFOL;  Surgeon: Harvel Quale, MD;  [facility];  Service: Gastroenterology;  Laterality: N/A;   HERNIA REPAIR Right    inguinal   LUMBAR LAMINECTOMY/DECOMPRESSION MICRODISCECTOMY N/A 04/21/2021   Procedure: LAMINECTOMY, MEDIAL FACETECTOMY LUMBAR FOUR- FIVE;  Surgeon: Vallarie Mare, MD;  Location: [place];  Service: Neurosurgery;  Laterality: N/A;   POLYPECTOMY  12/22/2021   Procedure: POLYPECTOMY;  Surgeon: Harvel Quale, MD;  [facility];  Service: Gastroenterology;;   TONSILLECTOMY     removed as a child   Social History   Tobacco Use   Smoking status: Every Day    Packs/day: 1    Types: Cigarettes    Passive exposure: Current   Smokeless tobacco: Never   Tobacco comments:    Less than 1 pack per day  Vaping Use   Vaping Use: Never used  Substance Use Topics   Alcohol use: Not Currently   Drug use: Yes    Types: Marijuana    Comment: twice a week   Family History  Problem Relation Age of Onset   Esophageal cancer Mother    Lung cancer Father  Allergies  Allergen Reactions   Lisinopril Itching    headaches   Review of Systems  HENT:  Positive for congestion.   All other systems reviewed and are negative.     Objective:     BP 104/71   Pulse 96   Ht 5\' 4"  (1.626 m)   Wt 207 lb 6.4 oz (94.1 kg)   SpO2 90%   BMI 35.60 kg/m  BP Readings from Last 3 Encounters:  04/07/22 104/71  02/28/22 117/73  02/08/22 126/84   Physical Exam Vitals reviewed.  Constitutional:      General: He is not in acute distress.    Appearance: Normal  appearance. He is obese. He is not ill-appearing.  HENT:     Head: Normocephalic and atraumatic.     Right Ear: External ear normal.     Left Ear: External ear normal.     Nose: Nose normal. No congestion or rhinorrhea.     Mouth/Throat:     Mouth: Mucous membranes are moist.     Pharynx: Oropharynx is clear.  Eyes:     General: No scleral icterus.    Extraocular Movements: Extraocular movements intact.     Conjunctiva/sclera: Conjunctivae normal.     Pupils: Pupils are equal, round, and reactive to light.  Cardiovascular:     Rate and Rhythm: Normal rate and regular rhythm.     Pulses: Normal pulses.     Heart sounds: Normal heart sounds. No murmur heard. Pulmonary:     Effort: Pulmonary effort is normal.     Breath sounds: Normal breath sounds. No wheezing, rhonchi or rales.  Abdominal:     General: Abdomen is flat. Bowel sounds are normal. There is no distension.     Palpations: Abdomen is soft.     Tenderness: There is no abdominal tenderness.  Musculoskeletal:        General: No swelling or deformity. Normal range of motion.     Cervical back: Normal range of motion.     Right lower leg: No edema.     Left lower leg: No edema.  Skin:    General: Skin is warm and dry.     Capillary Refill: Capillary refill takes less than 2 seconds.  Neurological:     General: No focal deficit present.     Mental Status: He is alert and oriented to person, place, and time.     Motor: No weakness.  Psychiatric:        Mood and Affect: Mood normal.        Behavior: Behavior normal.        Thought Content: Thought content normal.   Last CBC Lab Results  Component Value Date   WBC 8.8 11/20/2021   HGB 14.5 11/20/2021   HCT 43.7 11/20/2021   MCV 90.5 11/20/2021   MCH 30.0 11/20/2021   RDW 13.9 11/20/2021   PLT 292 [id number]   Last metabolic panel Lab Results  Component Value Date   GLUCOSE 107 (H) 04/07/2022   NA 140 04/07/2022   K 4.0 04/07/2022   CL 94 (L) 04/07/2022    CO2 28 04/07/2022   BUN 13 04/07/2022   CREATININE 1.15 04/07/2022   EGFR 74 04/07/2022   CALCIUM 10.0 04/07/2022   PROT 7.4 12/13/2021   ALBUMIN 4.6 12/13/2021   LABGLOB 2.8 12/13/2021   AGRATIO 1.6 12/13/2021   BILITOT 0.8 12/13/2021   ALKPHOS 102 12/13/2021   AST 34 12/13/2021   ALT 45 (H) 12/13/2021  ANIONGAP 8 11/20/2021   Last lipids Lab Results  Component Value Date   CHOL 147 01/23/2022   HDL 30 (L) 01/23/2022   LDLCALC 71 01/23/2022   TRIG 282 (H) 01/23/2022   CHOLHDL 4.9 01/23/2022   Last hemoglobin A1c Lab Results  Component Value Date   HGBA1C 6.2 (H) 09/22/2021   Last thyroid functions Lab Results  Component Value Date   TSH 1.410 09/13/2021   Last vitamin D Lab Results  Component Value Date   VD25OH 55.3 04/07/2022   Last vitamin B12 and Folate Lab Results  Component Value Date   VITAMINB12 272 09/22/2021   FOLATE >20.0 09/22/2021     Assessment & Plan:   Problem List Items Addressed This Visit       Type 2 diabetes mellitus without complications ([place])    Recent diagnosis, meeting criteria with A1c 6.6 on labs from February.  Metformin XR 500 mg daily was initiated.  He has not experienced any adverse side effects since starting metformin.  Reports that his blood sugars are running low 100s. -No additional medication changes today.      Lumbar stenosis - Primary    History of chronic lumbar back pain secondary to a history of lumbar stenosis s/p laminectomy.  Followed by pain management.  He will soon establish care at [facility] since Dr. Merlene Laughter has retired.  He is currently prescribed Percocet and Lyrica.  PDMP reviewed and is appropriate.  He will run out of medication prior to establishing care at [facility]. -I have placed one-time prescriptions for Percocet and Lyrica.  Jerry Aguirre understands that [facility] will assume these prescriptions when he establishes care at their practice next month.      Vitamin D deficiency    Noted  on labs from January.  He has completed high-dose, weekly vitamin D supplementation. -Repeat vitamin D level ordered today      Hypokalemia    Noted on recent labs. -Repeat BMP ordered today      Seasonal allergies    Astelin nasal spray prescribed today       Return in about 3 months (around 07/08/2022).    Johnette Abraham, MD

## 2022-04-08 LAB — BMP8+EGFR
BUN/Creatinine Ratio: 11 (ref 9–20)
BUN: 13 mg/dL (ref 6–24)
CO2: 28 mmol/L (ref 20–29)
Calcium: 10 mg/dL (ref 8.7–10.2)
Chloride: 94 mmol/L — ABNORMAL LOW (ref 96–106)
Creatinine, Ser: 1.15 mg/dL (ref 0.76–1.27)
Glucose: 107 mg/dL — ABNORMAL HIGH (ref 70–99)
Potassium: 4 mmol/L (ref 3.5–5.2)
Sodium: 140 mmol/L (ref 134–144)
eGFR: 74 mL/min/{1.73_m2} (ref >59)

## 2022-04-08 LAB — VITAMIN D 25 HYDROXY (VIT D DEFICIENCY, FRACTURES): Vit D, 25-Hydroxy: 55.3 ng/mL (ref 30.0–100.0)

## 2022-04-13 ENCOUNTER — Telehealth: Payer: Self-pay | Admitting: Pulmonary Disease

## 2022-04-13 DIAGNOSIS — J302 Other seasonal allergic rhinitis: Secondary | ICD-10-CM | POA: Insufficient documentation

## 2022-04-13 NOTE — Assessment & Plan Note (Signed)
History of chronic lumbar back pain secondary to a history of lumbar stenosis s/p laminectomy.  Followed by pain management.  He will soon establish care at [facility] since Dr. Merlene Laughter has retired.  He is currently prescribed Percocet and Lyrica.  PDMP reviewed and is appropriate.  He will run out of medication prior to establishing care at [facility]. -I have placed one-time prescriptions for Percocet and Lyrica.  Jerry Aguirre understands that [facility] will assume these prescriptions when he establishes care at their practice next month.

## 2022-04-13 NOTE — Assessment & Plan Note (Signed)
Astelin nasal spray prescribed today

## 2022-04-13 NOTE — Assessment & Plan Note (Signed)
Noted on labs from January.  He has completed high-dose, weekly vitamin D supplementation. -Repeat vitamin D level ordered today

## 2022-04-13 NOTE — Telephone Encounter (Signed)
Called and spoke to [place] and she is just asking for office notes. Confirmed fax number. Nothing further needed

## 2022-04-13 NOTE — Telephone Encounter (Signed)
Called to request additional information regarding cpap order for patient.  Please call to discuss further at [phone], Fax:  [phone]

## 2022-04-13 NOTE — Assessment & Plan Note (Signed)
Noted on recent labs. -Repeat BMP ordered today

## 2022-04-13 NOTE — Assessment & Plan Note (Signed)
Recent diagnosis, meeting criteria with A1c 6.6 on labs from February.  Metformin XR 500 mg daily was initiated.  He has not experienced any adverse side effects since starting metformin.  Reports that his blood sugars are running low 100s. -No additional medication changes today.

## 2022-04-19 ENCOUNTER — Telehealth: Payer: Self-pay | Admitting: Internal Medicine

## 2022-04-19 NOTE — Telephone Encounter (Signed)
Routing to Dr. Melvyn Novas as a FYI.

## 2022-04-19 NOTE — Telephone Encounter (Signed)
Jerry Aguirre calling from [facility]. This PT's AHI is only 3.8. Needs to be 4.0 saturation so he does not qual for the CPAP. She will call and tell the PT.

## 2022-04-22 ENCOUNTER — Other Ambulatory Visit: Payer: Self-pay | Admitting: Orthopedic Surgery

## 2022-04-22 ENCOUNTER — Other Ambulatory Visit: Payer: Self-pay | Admitting: Internal Medicine

## 2022-04-22 DIAGNOSIS — E785 Hyperlipidemia, unspecified: Secondary | ICD-10-CM

## 2022-04-25 ENCOUNTER — Ambulatory Visit: Payer: Medicaid Other | Admitting: Internal Medicine

## 2022-04-26 ENCOUNTER — Other Ambulatory Visit: Payer: Self-pay | Admitting: Internal Medicine

## 2022-04-26 DIAGNOSIS — E119 Type 2 diabetes mellitus without complications: Secondary | ICD-10-CM

## 2022-04-27 ENCOUNTER — Other Ambulatory Visit: Payer: Self-pay | Admitting: Internal Medicine

## 2022-04-27 DIAGNOSIS — E119 Type 2 diabetes mellitus without complications: Secondary | ICD-10-CM

## 2022-04-27 MED ORDER — BLOOD GLUCOSE MONITORING SUPPL DEVI: 1.0000 | Freq: Three times a day (TID) | 0 refills | Status: AC

## 2022-05-04 ENCOUNTER — Other Ambulatory Visit: Payer: Self-pay | Admitting: Internal Medicine

## 2022-05-04 DIAGNOSIS — E559 Vitamin D deficiency, unspecified: Secondary | ICD-10-CM

## 2022-05-08 ENCOUNTER — Other Ambulatory Visit: Payer: Self-pay | Admitting: Orthopedic Surgery

## 2022-05-08 ENCOUNTER — Other Ambulatory Visit: Payer: Self-pay | Admitting: Internal Medicine

## 2022-05-08 DIAGNOSIS — E119 Type 2 diabetes mellitus without complications: Secondary | ICD-10-CM

## 2022-05-15 ENCOUNTER — Other Ambulatory Visit: Payer: Self-pay | Admitting: Orthopedic Surgery

## 2022-05-27 ENCOUNTER — Other Ambulatory Visit: Payer: Self-pay | Admitting: Orthopedic Surgery

## 2022-06-04 ENCOUNTER — Other Ambulatory Visit: Payer: Self-pay | Admitting: Internal Medicine

## 2022-06-07 ENCOUNTER — Other Ambulatory Visit: Payer: Self-pay | Admitting: Internal Medicine

## 2022-06-13 ENCOUNTER — Other Ambulatory Visit: Payer: Self-pay | Admitting: Orthopedic Surgery

## 2022-06-13 ENCOUNTER — Other Ambulatory Visit: Payer: Self-pay | Admitting: Internal Medicine

## 2022-06-13 DIAGNOSIS — E785 Hyperlipidemia, unspecified: Secondary | ICD-10-CM

## 2022-06-17 ENCOUNTER — Other Ambulatory Visit: Payer: Self-pay | Admitting: Internal Medicine

## 2022-06-17 DIAGNOSIS — R600 Localized edema: Secondary | ICD-10-CM

## 2022-06-19 ENCOUNTER — Other Ambulatory Visit: Payer: Self-pay | Admitting: Internal Medicine

## 2022-06-19 DIAGNOSIS — M48061 Spinal stenosis, lumbar region without neurogenic claudication: Secondary | ICD-10-CM

## 2022-06-19 DIAGNOSIS — E119 Type 2 diabetes mellitus without complications: Secondary | ICD-10-CM

## 2022-06-19 MED ORDER — ACCU-CHEK GUIDE VI STRP
ORAL_STRIP | 0 refills | Status: DC
Start: 2022-06-19 — End: 2022-08-04

## 2022-06-19 MED ORDER — PREGABALIN 150 MG PO CAPS: 150.0000 mg | ORAL_CAPSULE | Freq: Every day | ORAL | 0 refills | Status: DC

## 2022-06-21 ENCOUNTER — Telehealth: Payer: Self-pay | Admitting: Internal Medicine

## 2022-06-21 ENCOUNTER — Other Ambulatory Visit: Payer: Self-pay

## 2022-06-21 ENCOUNTER — Encounter: Payer: Self-pay | Admitting: Internal Medicine

## 2022-06-21 DIAGNOSIS — K22711 Barrett's esophagus with high grade dysplasia: Secondary | ICD-10-CM

## 2022-06-21 MED ORDER — METOPROLOL SUCCINATE ER 25 MG PO TB24: 25.0000 mg | ORAL_TABLET | Freq: Every day | ORAL | 0 refills | Status: DC

## 2022-06-21 NOTE — Telephone Encounter (Signed)
Refills sent to pharmacy. 

## 2022-06-21 NOTE — Telephone Encounter (Signed)
Prescription Request  06/21/2022  LOV: 04/07/2022  What is the name of the medication or equipment? metoprolol succinate (TOPROL-XL) 25 MG 24 hr tablet   PROTINIX  Water Pill   Have you contacted your pharmacy to request a refill? Yes   Which pharmacy would you like this sent to?  [facility] - [place], [place] - [address] [place] [address] Phone: [phone] Fax: [phone]    Patient notified that their request is being sent to the clinical staff for review and that they should receive a response within 2 business days.   Please advise at [facility] [phone]   Pt is out of meds

## 2022-06-22 MED ORDER — FUROSEMIDE 40 MG PO TABS: 40.0000 mg | ORAL_TABLET | Freq: Three times a day (TID) | ORAL | 1 refills | Status: DC | PRN

## 2022-06-22 MED ORDER — PANTOPRAZOLE SODIUM 40 MG PO TBEC: 40.0000 mg | DELAYED_RELEASE_TABLET | Freq: Two times a day (BID) | ORAL | 0 refills | Status: DC

## 2022-06-22 NOTE — Telephone Encounter (Signed)
Tonda aware.

## 2022-06-23 ENCOUNTER — Other Ambulatory Visit: Payer: Self-pay | Admitting: Orthopedic Surgery

## 2022-07-10 ENCOUNTER — Other Ambulatory Visit: Payer: Self-pay | Admitting: Internal Medicine

## 2022-07-10 ENCOUNTER — Other Ambulatory Visit: Payer: Self-pay | Admitting: Orthopedic Surgery

## 2022-07-10 DIAGNOSIS — E119 Type 2 diabetes mellitus without complications: Secondary | ICD-10-CM

## 2022-07-14 ENCOUNTER — Ambulatory Visit: Payer: Medicaid Other | Admitting: Internal Medicine

## 2022-07-19 ENCOUNTER — Encounter: Payer: Self-pay | Admitting: Internal Medicine

## 2022-07-19 ENCOUNTER — Ambulatory Visit (INDEPENDENT_AMBULATORY_CARE_PROVIDER_SITE_OTHER): Payer: Medicaid Other | Admitting: Internal Medicine

## 2022-07-19 VITALS — BP 123/70 | HR 92 | Ht 67.0 in | Wt 223.4 lb

## 2022-07-19 DIAGNOSIS — M5136 Other intervertebral disc degeneration, lumbar region: Secondary | ICD-10-CM | POA: Diagnosis not present

## 2022-07-19 DIAGNOSIS — R600 Localized edema: Secondary | ICD-10-CM

## 2022-07-19 DIAGNOSIS — M25841 Other specified joint disorders, right hand: Secondary | ICD-10-CM

## 2022-07-19 DIAGNOSIS — M25511 Pain in right shoulder: Secondary | ICD-10-CM

## 2022-07-19 DIAGNOSIS — Z9889 Other specified postprocedural states: Secondary | ICD-10-CM | POA: Diagnosis not present

## 2022-07-19 DIAGNOSIS — E119 Type 2 diabetes mellitus without complications: Secondary | ICD-10-CM

## 2022-07-19 DIAGNOSIS — G8929 Other chronic pain: Secondary | ICD-10-CM

## 2022-07-19 DIAGNOSIS — C159 Malignant neoplasm of esophagus, unspecified: Secondary | ICD-10-CM

## 2022-07-19 DIAGNOSIS — M48061 Spinal stenosis, lumbar region without neurogenic claudication: Secondary | ICD-10-CM | POA: Diagnosis not present

## 2022-07-19 DIAGNOSIS — R2231 Localized swelling, mass and lump, right upper limb: Secondary | ICD-10-CM | POA: Insufficient documentation

## 2022-07-19 IMAGING — MR MR HEAD W/O CM
9 of 10 series · 39 of 48 positions shown · non-contrast
Comparison: Head CT and CTA 07/21/2020.  Head MRI 04/28/2020.

CLINICAL DATA: Slurred speech, facial droop, and left arm drift.

EXAM:
MRI HEAD WITHOUT CONTRAST
TECHNIQUE: Multiplanar, multiecho pulse sequences of the brain and surrounding
structures were obtained without intravenous contrast.

[Series 5: DWI · axial · 4.0mm · 0.88mm/px · z∈[-96,+41]mm · 5 of 36 slices shown (1 of 4)]
[im 1/36]
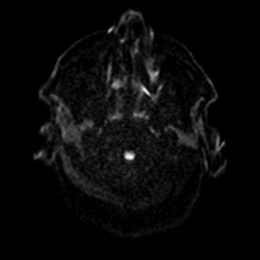
[im 9/36]
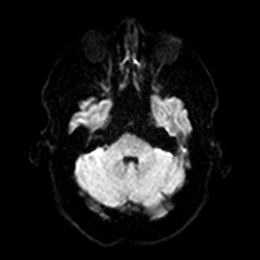
[im 18/36]
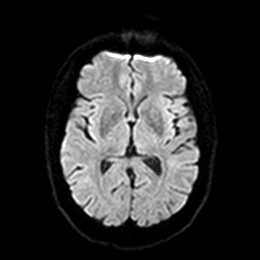
[im 27/36]
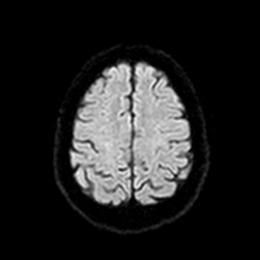
[im 36/36]
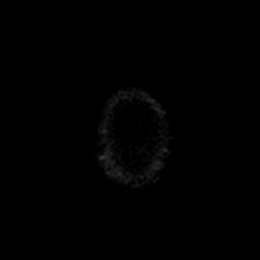

[Series 6: DWI · axial · 4.0mm · 0.88mm/px · z∈[-96,+41]mm · 5 of 36 slices shown (2 of 4)]
[im 1/36]
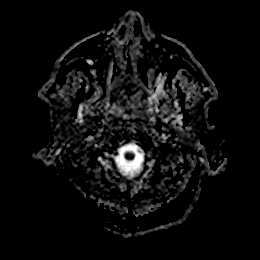
[im 9/36]
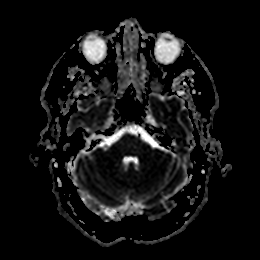
[im 18/36]
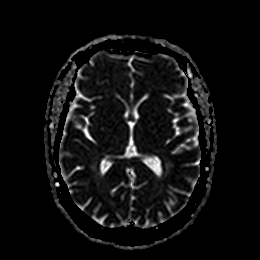
[im 27/36]
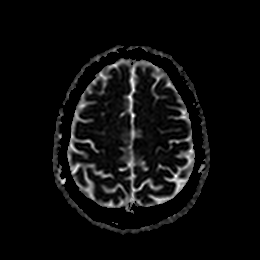
[im 36/36]
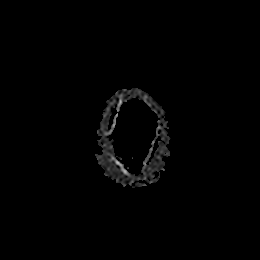

[Series 7: DWI · coronal · 4.0mm · 0.88mm/px · 5 of 32 slices shown (3 of 4)]
[im 1/32]
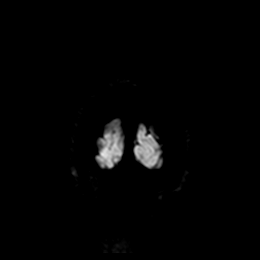
[im 8/32]
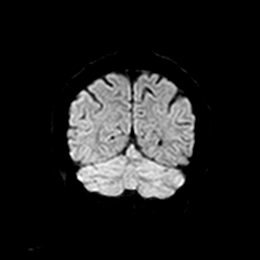
[im 16/32]
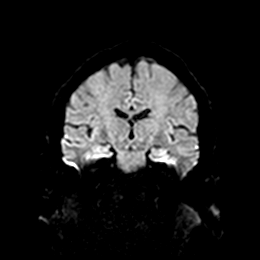
[im 24/32]
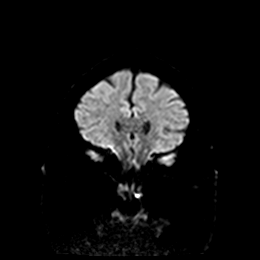
[im 32/32]
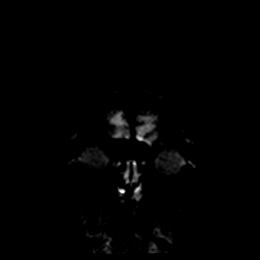

[Series 8: DWI · coronal · 4.0mm · 0.88mm/px · 5 of 32 slices shown (4 of 4)]
[im 1/32]
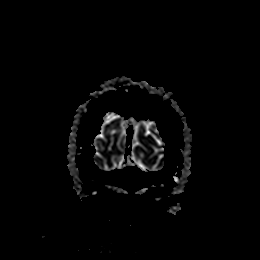
[im 8/32]
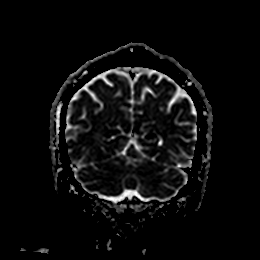
[im 16/32]
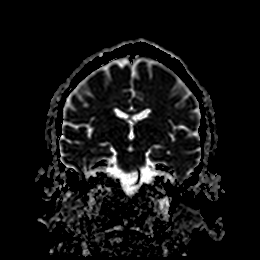
[im 24/32]
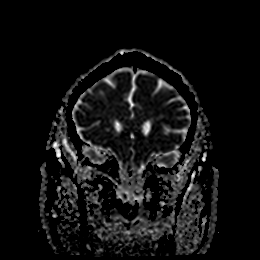
[im 32/32]
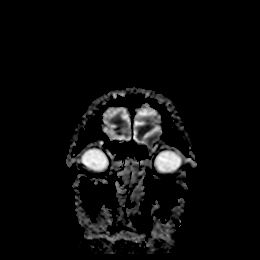

[Series 9: T1 · sagittal · 5.0mm · 0.80mm/px · 3 of 23 slices shown]
[im 1/23]
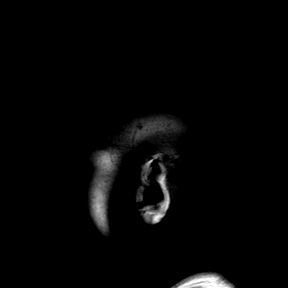
[im 12/23]
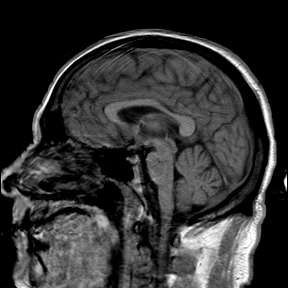
[im 23/23]
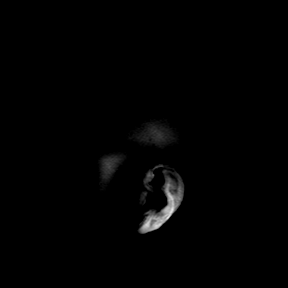

[Series 10: T2 · axial · 5.0mm · 0.72mm/px · z∈[-103,+48]mm · 3 of 23 slices shown (1 of 2)]
[im 1/23]
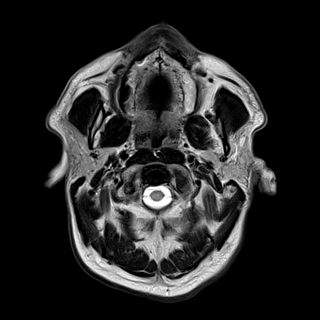
[im 12/23]
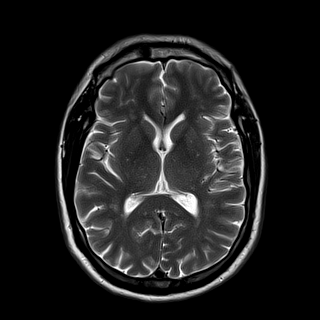
[im 23/23]
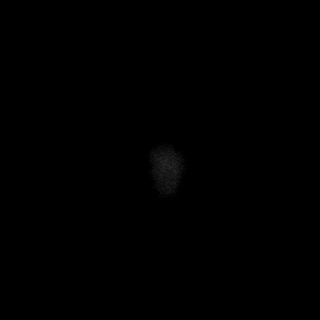

[Series 11: ax hemo · axial · 5.0mm · 0.86mm/px · z∈[-95,+46]mm · 4 of 25 slices shown]
[im 1/25]
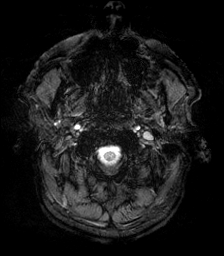
[im 9/25]
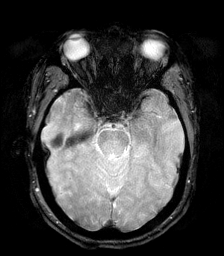
[im 17/25]
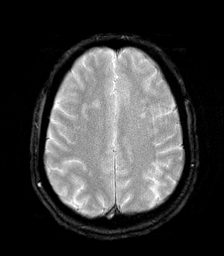
[im 25/25]
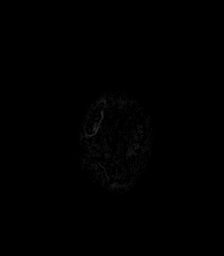

[Series 12: FLAIR · axial · 4.0mm · 0.43mm/px · z∈[-97,+48]mm · 5 of 38 slices shown]
[im 1/38]
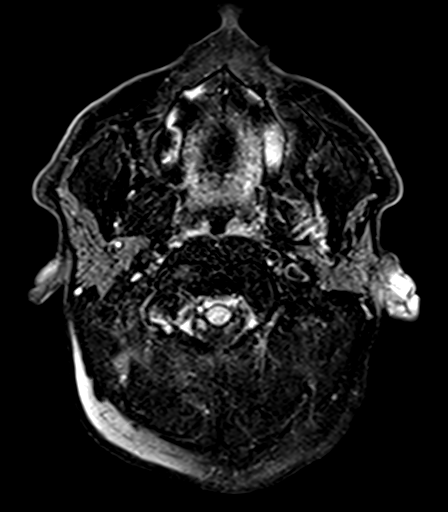
[im 10/38]
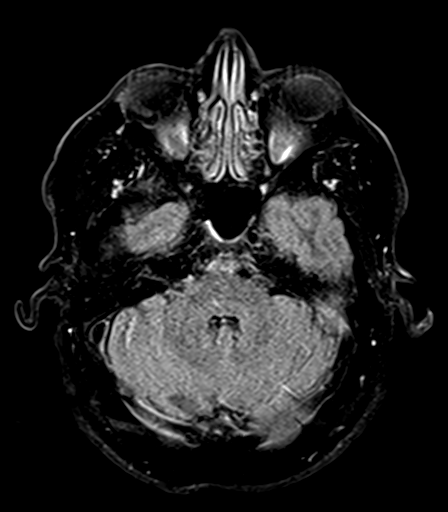
[im 19/38]
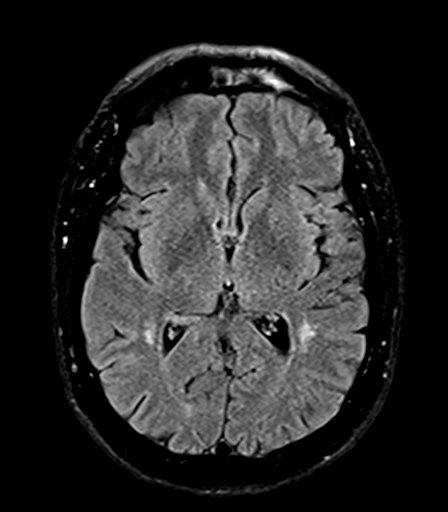
[im 28/38]
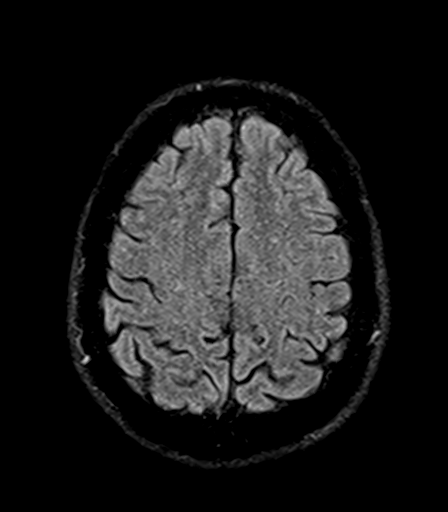
[im 38/38]
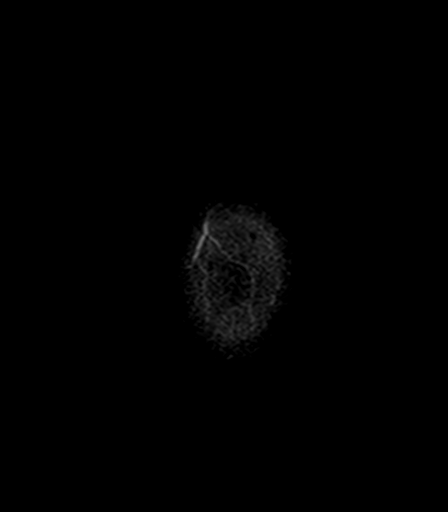

[Series 14: T2 · coronal · 5.0mm · 0.72mm/px · 4 of 28 slices shown (2 of 2)]
[im 1/28]
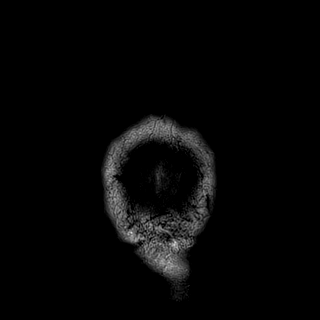
[im 10/28]
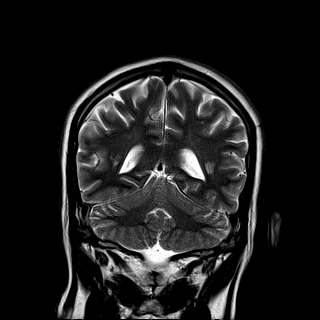
[im 19/28]
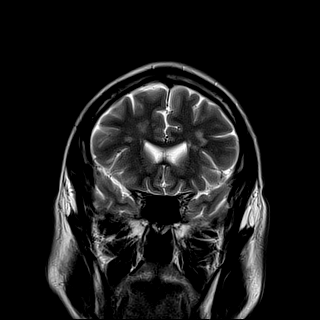
[im 28/28]
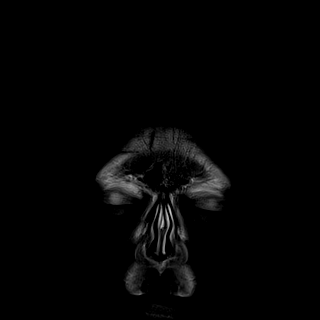

[39 of 48 positions shown; findings below may reference images not displayed]

FINDINGS: Brain: There is no evidence of an acute infarct, intracranial
hemorrhage, mass, midline shift, or extra-axial fluid collection.
Patchy T2 hyperintensities in the cerebral white matter bilaterally
and in the pons are unchanged the prior MRI and are nonspecific but
compatible with mild-to-moderate chronic small vessel ischemic
disease. The ventricles and sulci are within normal limits for age.

Vascular: Major intracranial vascular flow voids are preserved.

Skull and upper cervical spine: No suspicious marrow lesion.

Sinuses/Orbits: Unremarkable orbits. Minimal bilateral ethmoid air
cell mucosal thickening. No significant mastoid fluid.

Other: None.
IMPRESSION: 1. No acute intracranial abnormality.
2. Mild-to-moderate chronic small vessel ischemic disease.

## 2022-07-19 MED ORDER — MEDICAL COMPRESSION STOCKINGS MISC
1.0000 | Freq: Every day | 0 refills | Status: DC
Start: 2022-07-19 — End: 2023-01-22

## 2022-07-19 MED ORDER — FUROSEMIDE 40 MG PO TABS
40.0000 mg | ORAL_TABLET | Freq: Three times a day (TID) | ORAL | 1 refills | Status: DC | PRN
Start: 2022-07-19 — End: 2022-10-20

## 2022-07-19 NOTE — Assessment & Plan Note (Signed)
Secondary to Barrett's esophagus.  Followed by gastroenterology at [facility].  EGD performed 5/9.  He will undergo repeat EGD on 7/29.

## 2022-07-19 NOTE — Assessment & Plan Note (Signed)
There is a tender mass present along the volar aspect of the right wrist/base of the right palm, where he has previously undergone carpal tunnel release surgery.  Today he has requested a new orthopedic surgery referral to establish care with a provider at [facility] to address multiple musculoskeletal issues, but his chief concern is the tender mass on his right hand.

## 2022-07-19 NOTE — Assessment & Plan Note (Signed)
He is currently prescribed metformin XR 500 mg daily. Reports today that blood sugars are largely within goal. -Diabetic foot exam performed today.  Podiatry referral placed. -Repeat A1c at follow-up in 3 months

## 2022-07-19 NOTE — Progress Notes (Signed)
Established Patient Office Visit  Subjective   Patient ID: Jerry Aguirre, male    DOB: 03/13/1965  Age: 57 y.o. MRN: [id number]  Chief Complaint  Patient presents with   Diabetes    Follow up   Jerry Aguirre returns to care today for routine follow-up.  He was last evaluated by me on 3/22.  No medication changes were made at that time and 32-month follow-up was arranged.  In the interim Jerry Aguirre underwent esophagoscopy with tumor ablation on 5/9.  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  He has also been seen by pulmonology at [facility].  Jerry Aguirre reports feeling poorly today.  He continues to endorse bilateral lower extremity and abdominal swelling.  He additionally endorses a headache.  He expresses frustration because despite an extensive workup the etiology of his symptoms has never been fully revealed.  His acute concerns today are requesting a new orthopedic surgery referral to establish care with a provider at [facility].  He has transitioned much of his care to providers at [facility] and would like to also switch to seeing an orthopedic surgeon affiliated with [facility].  He has multiple musculoskeletal concerns.  His additional request is that I placed an order for waist high compression stockings.  Past Medical History:  Diagnosis Date   COPD (chronic obstructive pulmonary disease) (HCC)    Dyspnea    GERD (gastroesophageal reflux disease)    Headache    Hyperlipidemia    Hypertension    Sleep apnea    pt says his "OSA was mild and they said he did not need a CPAP"   Past Surgical History:  Procedure Laterality Date   BIOPSY  12/22/2021   Procedure: BIOPSY;  Surgeon: Dolores Frame, MD;  [facility];  Service: Gastroenterology;;   COLONOSCOPY WITH PROPOFOL N/A 12/22/2021   Procedure: COLONOSCOPY WITH PROPOFOL;  Surgeon: Dolores Frame, MD;  [facility];  Service: Gastroenterology;  Laterality: N/A;   10:15am, asa 3   ESOPHAGOGASTRODUODENOSCOPY (EGD) WITH PROPOFOL N/A 12/22/2021   Procedure: ESOPHAGOGASTRODUODENOSCOPY (EGD) WITH PROPOFOL;  Surgeon: Dolores Frame, MD;  [facility];  Service: Gastroenterology;  Laterality: N/A;   HERNIA REPAIR Right    inguinal   LUMBAR LAMINECTOMY/DECOMPRESSION MICRODISCECTOMY N/A 04/21/2021   Procedure: LAMINECTOMY, MEDIAL FACETECTOMY LUMBAR FOUR- FIVE;  Surgeon: Bedelia Person, MD;  Location: [facility] OR;  Service: Neurosurgery;  Laterality: N/A;   POLYPECTOMY  12/22/2021   Procedure: POLYPECTOMY;  Surgeon: Dolores Frame, MD;  Location: [facility];  Service: Gastroenterology;;   TONSILLECTOMY     removed as a child   Social History   Tobacco Use   Smoking status: Every Day    Packs/day: 1    Types: Cigarettes    Passive exposure: Current   Smokeless tobacco: Never   Tobacco comments:    Less than 1 pack per day  Vaping Use   Vaping Use: Never used  Substance Use Topics   Alcohol use: Not Currently   Drug use: Yes    Types: Marijuana    Comment: twice a week   Family History  Problem Relation Age of Onset   Esophageal cancer Mother    Lung cancer Father    Allergies  Allergen Reactions   Lisinopril Itching    headaches   Review of Systems  Cardiovascular:  Positive for leg swelling.  Gastrointestinal:  Positive for abdominal pain.  Musculoskeletal:  Positive for  back pain, joint pain and neck pain.  Neurological:  Positive for headaches.     Objective:     BP 123/70   Pulse 92   Ht 5\' 7"  (1.702 m)   Wt 223 lb 6.4 oz (101.3 kg)   SpO2 91%   BMI 34.99 kg/m  BP Readings from Last 3 Encounters:  07/19/22 123/70  04/07/22 104/71  02/28/22 117/73    Physical Exam Vitals reviewed.  Constitutional:      General: He is not in acute distress.    Appearance: Normal appearance. He is obese. He is not ill-appearing.  HENT:     Head: Normocephalic and atraumatic.     Right Ear: External ear  normal.     Left Ear: External ear normal.     Nose: Nose normal. No congestion or rhinorrhea.     Mouth/Throat:     Mouth: Mucous membranes are moist.     Pharynx: Oropharynx is clear.  Eyes:     General: No scleral icterus.    Extraocular Movements: Extraocular movements intact.     Conjunctiva/sclera: Conjunctivae normal.     Pupils: Pupils are equal, round, and reactive to light.  Cardiovascular:     Rate and Rhythm: Normal rate and regular rhythm.     Pulses: Normal pulses.     Heart sounds: Normal heart sounds. No murmur heard. Pulmonary:     Effort: Pulmonary effort is normal.     Breath sounds: Normal breath sounds. No wheezing, rhonchi or rales.  Abdominal:     General: Abdomen is flat. Bowel sounds are normal. There is no distension.     Palpations: Abdomen is soft.     Tenderness: There is no abdominal tenderness.  Musculoskeletal:        General: Swelling present. No deformity. Normal range of motion.     Cervical back: Normal range of motion.     Right lower leg: Edema present.     Left lower leg: Edema present.  Skin:    General: Skin is warm and dry.     Capillary Refill: Capillary refill takes less than 2 seconds.     Findings: Lesion (Tender mass present along the volar aspect of the right wrist) present.  Neurological:     General: No focal deficit present.     Mental Status: He is alert and oriented to person, place, and time.     Motor: No weakness.  Psychiatric:        Mood and Affect: Mood normal.        Behavior: Behavior normal.        Thought Content: Thought content normal.   Diabetic foot exam was performed.  Visual Findings: Callus formation on each foot Posterior tibialis and dorsalis pulse intact bilaterally.  Intact to touch and monofilament testing bilaterally.    Last CBC Lab Results  Component Value Date   WBC 8.8 11/20/2021   HGB 14.5 11/20/2021   HCT 43.7 11/20/2021   MCV 90.5 11/20/2021   MCH 30.0 11/20/2021   RDW 13.9  11/20/2021   PLT 292 11/20/2021   Last metabolic panel Lab Results  Component Value Date   GLUCOSE 107 (H) 04/07/2022   NA 140 04/07/2022   K 4.0 04/07/2022   CL 94 (L) 04/07/2022   CO2 28 04/07/2022   BUN 13 04/07/2022   CREATININE 1.15 04/07/2022   EGFR 74 04/07/2022   CALCIUM 10.0 04/07/2022   PROT 7.4 12/13/2021   ALBUMIN 4.6 12/13/2021  LABGLOB 2.8 12/13/2021   AGRATIO 1.6 12/13/2021   BILITOT 0.8 12/13/2021   ALKPHOS 102 12/13/2021   AST 34 12/13/2021   ALT 45 (H) 12/13/2021   ANIONGAP 8 11/20/2021   Last lipids Lab Results  Component Value Date   CHOL 147 01/23/2022   HDL 30 (L) 01/23/2022   LDLCALC 71 01/23/2022   TRIG 282 (H) 01/23/2022   CHOLHDL 4.9 01/23/2022   Last hemoglobin A1c Lab Results  Component Value Date   HGBA1C 6.2 (H) 09/22/2021   Last thyroid functions Lab Results  Component Value Date   TSH 1.410 09/13/2021   Last vitamin D Lab Results  Component Value Date   VD25OH 55.3 04/07/2022   Last vitamin B12 and Folate Lab Results  Component Value Date   VITAMINB12 272 09/22/2021   FOLATE >20.0 09/22/2021     Assessment & Plan:   Problem List Items Addressed This Visit       Esophageal adenocarcinoma (HCC)    Secondary to Barrett's esophagus.  Followed by gastroenterology at [facility].  EGD performed 5/9.  He will undergo repeat EGD on 7/29.      Type 2 diabetes mellitus without complications (HCC)    He is currently prescribed metformin XR 500 mg daily. Reports today that blood sugars are largely within goal. -Diabetic foot exam performed today.  Podiatry referral placed. -Repeat A1c at follow-up in 3 months      Bilateral lower extremity edema    Largely unchanged.  He is currently taking Lasix 40 mg 3 times daily as needed.  Waist high compression stockings have been prescribed today.      Mass of right hand    There is a tender mass present along the volar aspect of the right wrist/base of the right palm, where he has  previously undergone carpal tunnel release surgery.  Today he has requested a new orthopedic surgery referral to establish care with a provider at [facility] to address multiple musculoskeletal issues, but his chief concern is the tender mass on his right hand.      Return in about 3 months (around 10/19/2022).   Billie Lade, MD

## 2022-07-19 NOTE — Assessment & Plan Note (Signed)
Largely unchanged.  He is currently taking Lasix 40 mg 3 times daily as needed.  Waist high compression stockings have been prescribed today.

## 2022-07-19 NOTE — Patient Instructions (Signed)
It was a pleasure to see you today.  Thank you for giving [name] the opportunity to be involved in your care.  Below is a brief recap of your visit and next steps.  We will plan to see you again in 3 months.  Summary Orthopedic surgery and podiatry referrals placed Waist-high compression stockings ordered Follow up in 3 months

## 2022-07-21 ENCOUNTER — Other Ambulatory Visit: Payer: Self-pay | Admitting: Orthopedic Surgery

## 2022-07-21 ENCOUNTER — Other Ambulatory Visit: Payer: Self-pay | Admitting: Internal Medicine

## 2022-07-21 DIAGNOSIS — M48061 Spinal stenosis, lumbar region without neurogenic claudication: Secondary | ICD-10-CM

## 2022-07-27 ENCOUNTER — Encounter: Payer: Self-pay | Admitting: Internal Medicine

## 2022-07-27 NOTE — Telephone Encounter (Signed)
Spoke with Laurena Slimmer and will get orders placed.

## 2022-07-31 ENCOUNTER — Ambulatory Visit (INDEPENDENT_AMBULATORY_CARE_PROVIDER_SITE_OTHER): Payer: Medicaid Other | Admitting: Podiatry

## 2022-07-31 ENCOUNTER — Encounter: Payer: Self-pay | Admitting: Podiatry

## 2022-07-31 VITALS — BP 119/84

## 2022-07-31 DIAGNOSIS — M79675 Pain in left toe(s): Secondary | ICD-10-CM | POA: Diagnosis not present

## 2022-07-31 DIAGNOSIS — B351 Tinea unguium: Secondary | ICD-10-CM

## 2022-07-31 DIAGNOSIS — M79674 Pain in right toe(s): Secondary | ICD-10-CM | POA: Diagnosis not present

## 2022-07-31 DIAGNOSIS — E119 Type 2 diabetes mellitus without complications: Secondary | ICD-10-CM | POA: Diagnosis not present

## 2022-07-31 NOTE — Progress Notes (Signed)
This patient presents to the office with chief complaint of long thick nails and diabetic feet.  This patient  says there  is  no pain and discomfort in his  feet.  This patient says there are long thick painful nails.  These nails are painful walking and wearing shoes.  Patient has no history of infection or drainage from both feet.  Patient is unable to  self treat his own nails . This patient presents  to the office today for treatment of the  long nails and a foot evaluation due to history of  diabetes.  General Appearance  Alert, conversant and in no acute stress.  Vascular  Dorsalis pedis and posterior tibial  pulses are palpable  bilaterally.  Capillary return is within normal limits  bilaterally. Temperature is within normal limits  bilaterally.  Neurologic  Senn-Weinstein monofilament wire test within normal limits  bilaterally. Muscle power within normal limits bilaterally.  Nails Thick disfigured discolored nails with subungual debris  from hallux to fifth toes bilaterally. No evidence of bacterial infection or drainage bilaterally.  Orthopedic  No limitations of motion of motion feet .  No crepitus or effusions noted.  HAV  B/L.  Skin  normotropic skin with no porokeratosis noted bilaterally.  No signs of infections or ulcers noted.     Onychomycosis  Diabetes with no foot complications  IE  Debride nails x 10.  A diabetic foot exam was performed and there is no evidence of any vascular or neurologic pathology.   RTC 3 months.   Helane Gunther DPM

## 2022-08-02 ENCOUNTER — Ambulatory Visit (HOSPITAL_COMMUNITY): Payer: Medicaid Other

## 2022-08-04 ENCOUNTER — Other Ambulatory Visit: Payer: Self-pay | Admitting: Internal Medicine

## 2022-08-04 DIAGNOSIS — E119 Type 2 diabetes mellitus without complications: Secondary | ICD-10-CM

## 2022-08-11 ENCOUNTER — Other Ambulatory Visit: Payer: Self-pay | Admitting: Internal Medicine

## 2022-08-11 ENCOUNTER — Other Ambulatory Visit: Payer: Self-pay | Admitting: Orthopedic Surgery

## 2022-08-11 ENCOUNTER — Encounter: Payer: Self-pay | Admitting: Internal Medicine

## 2022-08-11 DIAGNOSIS — E785 Hyperlipidemia, unspecified: Secondary | ICD-10-CM

## 2022-08-11 DIAGNOSIS — E119 Type 2 diabetes mellitus without complications: Secondary | ICD-10-CM

## 2022-08-12 ENCOUNTER — Other Ambulatory Visit: Payer: Self-pay | Admitting: Internal Medicine

## 2022-08-12 DIAGNOSIS — M48061 Spinal stenosis, lumbar region without neurogenic claudication: Secondary | ICD-10-CM

## 2022-08-30 ENCOUNTER — Ambulatory Visit (HOSPITAL_COMMUNITY)
Admission: RE | Admit: 2022-08-30 | Discharge: 2022-08-30 | Disposition: A | Discharge status: Home / Self Care | Payer: Medicaid Other | Source: Ambulatory Visit | Attending: Orthopedic Surgery | Admitting: Orthopedic Surgery

## 2022-08-30 DIAGNOSIS — M5104 Intervertebral disc disorders with myelopathy, thoracic region: Secondary | ICD-10-CM | POA: Insufficient documentation

## 2022-09-09 ENCOUNTER — Other Ambulatory Visit: Payer: Self-pay | Admitting: Orthopedic Surgery

## 2022-09-09 ENCOUNTER — Other Ambulatory Visit: Payer: Self-pay | Admitting: Internal Medicine

## 2022-09-09 DIAGNOSIS — E119 Type 2 diabetes mellitus without complications: Secondary | ICD-10-CM

## 2022-09-20 ENCOUNTER — Other Ambulatory Visit: Payer: Self-pay | Admitting: Internal Medicine

## 2022-09-20 MED ORDER — PANTOPRAZOLE SODIUM 40 MG PO TBEC: 40.0000 mg | DELAYED_RELEASE_TABLET | Freq: Two times a day (BID) | ORAL | 0 refills | Status: DC

## 2022-09-21 ENCOUNTER — Other Ambulatory Visit: Payer: Self-pay | Admitting: Internal Medicine

## 2022-09-21 ENCOUNTER — Other Ambulatory Visit: Payer: Self-pay | Admitting: Orthopedic Surgery

## 2022-09-21 DIAGNOSIS — M48061 Spinal stenosis, lumbar region without neurogenic claudication: Secondary | ICD-10-CM

## 2022-10-06 ENCOUNTER — Encounter: Payer: Self-pay | Admitting: Internal Medicine

## 2022-10-09 ENCOUNTER — Other Ambulatory Visit: Payer: Self-pay | Admitting: Internal Medicine

## 2022-10-09 ENCOUNTER — Other Ambulatory Visit: Payer: Self-pay | Admitting: Orthopedic Surgery

## 2022-10-09 DIAGNOSIS — E785 Hyperlipidemia, unspecified: Secondary | ICD-10-CM

## 2022-10-09 DIAGNOSIS — E119 Type 2 diabetes mellitus without complications: Secondary | ICD-10-CM

## 2022-10-10 ENCOUNTER — Other Ambulatory Visit: Payer: Self-pay | Admitting: Orthopedic Surgery

## 2022-10-10 MED ORDER — ACCU-CHEK GUIDE VI STRP
ORAL_STRIP | 0 refills | Status: DC
Start: 2022-10-10 — End: 2022-11-10

## 2022-10-10 MED ORDER — ATORVASTATIN CALCIUM 40 MG PO TABS
40.0000 mg | ORAL_TABLET | Freq: Every day | ORAL | 0 refills | Status: DC
Start: 2022-10-10 — End: 2022-11-27

## 2022-10-10 MED ORDER — METOPROLOL SUCCINATE ER 25 MG PO TB24: 25.0000 mg | ORAL_TABLET | Freq: Every day | ORAL | 0 refills | Status: DC

## 2022-10-13 ENCOUNTER — Other Ambulatory Visit: Payer: Self-pay | Admitting: Internal Medicine

## 2022-10-13 MED ORDER — ONDANSETRON HCL 4 MG PO TABS: 4.0000 mg | ORAL_TABLET | Freq: Two times a day (BID) | ORAL | 0 refills | Status: DC

## 2022-10-13 MED ORDER — CETIRIZINE HCL 10 MG PO TABS: 10.0000 mg | ORAL_TABLET | Freq: Every day | ORAL | 0 refills | Status: DC

## 2022-10-19 ENCOUNTER — Ambulatory Visit: Payer: Medicaid Other | Admitting: Internal Medicine

## 2022-10-19 ENCOUNTER — Encounter: Payer: Self-pay | Admitting: Internal Medicine

## 2022-10-19 VITALS — BP 105/65 | HR 85 | Resp 16 | Ht 64.0 in | Wt 216.0 lb

## 2022-10-19 DIAGNOSIS — K760 Fatty (change of) liver, not elsewhere classified: Secondary | ICD-10-CM | POA: Insufficient documentation

## 2022-10-19 DIAGNOSIS — Z2821 Immunization not carried out because of patient refusal: Secondary | ICD-10-CM | POA: Diagnosis not present

## 2022-10-19 DIAGNOSIS — M25552 Pain in left hip: Secondary | ICD-10-CM

## 2022-10-19 DIAGNOSIS — R600 Localized edema: Secondary | ICD-10-CM

## 2022-10-19 DIAGNOSIS — J449 Chronic obstructive pulmonary disease, unspecified: Secondary | ICD-10-CM

## 2022-10-19 DIAGNOSIS — M25841 Other specified joint disorders, right hand: Secondary | ICD-10-CM | POA: Insufficient documentation

## 2022-10-19 DIAGNOSIS — H65192 Other acute nonsuppurative otitis media, left ear: Secondary | ICD-10-CM | POA: Diagnosis not present

## 2022-10-19 DIAGNOSIS — H6692 Otitis media, unspecified, left ear: Secondary | ICD-10-CM | POA: Insufficient documentation

## 2022-10-19 DIAGNOSIS — E119 Type 2 diabetes mellitus without complications: Secondary | ICD-10-CM | POA: Diagnosis not present

## 2022-10-19 MED ORDER — AMOXICILLIN-POT CLAVULANATE 875-125 MG PO TABS
1.0000 | ORAL_TABLET | Freq: Two times a day (BID) | ORAL | 0 refills | Status: AC
Start: 2022-10-19 — End: 2022-10-26

## 2022-10-19 MED ORDER — OZEMPIC (0.25 OR 0.5 MG/DOSE) 2 MG/3ML ~~LOC~~ SOPN: 0.2500 mg | PEN_INJECTOR | SUBCUTANEOUS | 0 refills | Status: DC

## 2022-10-19 NOTE — Assessment & Plan Note (Signed)
Symptoms remain largely unchanged.  He is using Lasix 40 mg 3-4 times daily as needed.  Waist high compression stockings have previously been prescribed.  Starting Ozempic today to address weight loss and diabetes mellitus as otherwise documented.

## 2022-10-19 NOTE — Progress Notes (Signed)
Established Patient Office Visit  Subjective   Patient ID: Jerry Aguirre, male    DOB: 1965/04/20  Age: 57 y.o. MRN: [id number]  Chief Complaint  Patient presents with   Diabetes    3 month follow up    wrist lump     Painful right wrist lump that won't go away and its affecting his day to day activities    Leg Swelling    States he is swelling all over but I can see it in his legs    Jerry Aguirre returns to care today for routine follow-up.  He was last evaluated by me on 7/3.  At that time he reported feeling poorly, endorsing persistent bilateral lower extremity edema and abdominal swelling.  He requested a new orthopedic surgery referral to establish care with a provider at [facility] for evaluation of a painful lump developing on the dorsal aspect of hhis right wrist at the site of previous carpal tunnel release surgery.  In the interim, he has been evaluated by pulmonology and gastroenterology.  Underwent repeat EGD on 7/29, which revealed an irregular Z-line that was treated with APC.  Exam was otherwise unremarkable.  Repeat endoscopy recommended in 2-3 months for follow-up of Barrett's ablation.  He was evaluated by gastroenterology for follow-up earlier this week (10/1).  There have otherwise been no acute interval events.  Jerry Aguirre reports feeling poorly today.  He recently fell and currently is experiencing pain along the anterior aspect of the left hip.  He is able to ambulate without difficulty, but requests x-rays.  He additionally endorses left ear pain.  He has not been able to establish care with orthopedic surgery as he reports that he was never contacted to schedule an appointment.  He continues to experience edema in his legs and abdomen.  He reports that this has been attributed to underlying fatty liver changes.  Past Medical History:  Diagnosis Date   COPD (chronic obstructive pulmonary disease) (HCC)    Dyspnea    GERD (gastroesophageal reflux disease)    Headache     Hyperlipidemia    Hypertension    Sleep apnea    pt says his "OSA was mild and they said he did not need a CPAP"   Past Surgical History:  Procedure Laterality Date   BIOPSY  12/22/2021   Procedure: BIOPSY;  Surgeon: Dolores Frame, MD;  [facility];  Service: Gastroenterology;;   COLONOSCOPY WITH PROPOFOL N/A 12/22/2021   Procedure: COLONOSCOPY WITH PROPOFOL;  Surgeon: Dolores Frame, MD;  [facility];  Service: Gastroenterology;  Laterality: N/A;  10:15am, asa 3   ESOPHAGOGASTRODUODENOSCOPY (EGD) WITH PROPOFOL N/A 12/22/2021   Procedure: ESOPHAGOGASTRODUODENOSCOPY (EGD) WITH PROPOFOL;  Surgeon: Dolores Frame, MD;  [facility];  Service: Gastroenterology;  Laterality: N/A;   HERNIA REPAIR Right    inguinal   LUMBAR LAMINECTOMY/DECOMPRESSION MICRODISCECTOMY N/A 04/21/2021   Procedure: LAMINECTOMY, MEDIAL FACETECTOMY LUMBAR FOUR- FIVE;  Surgeon: Bedelia Person, MD;  Location: [facility] OR;  Service: Neurosurgery;  Laterality: N/A;   POLYPECTOMY  12/22/2021   Procedure: POLYPECTOMY;  Surgeon: Dolores Frame, MD;  [facility];  Service: Gastroenterology;;   TONSILLECTOMY     removed as a child   Social History   Tobacco Use   Smoking status: Every Day    Current packs/day: 1.00    Types: Cigarettes    Passive exposure: Current   Smokeless tobacco: Never   Tobacco comments:  Less than 1 pack per day  Vaping Use   Vaping status: Never Used  Substance Use Topics   Alcohol use: Not Currently   Drug use: Yes    Types: Marijuana    Comment: twice a week   Family History  Problem Relation Age of Onset   Esophageal cancer Mother    Lung cancer Father    Allergies  Allergen Reactions   Lisinopril Itching    headaches   Review of Systems  HENT:  Positive for ear pain (Left ear).   Cardiovascular:  Positive for leg swelling.  Musculoskeletal:  Positive for joint pain (Left hip, right wrist).   Neurological:  Positive for tingling (Right hand).      Objective:     BP 105/65   Pulse 85   Resp 16   Ht 5\' 4"  (1.626 m)   Wt 216 lb (98 kg)   SpO2 (!) 87%   BMI 37.08 kg/m  BP Readings from Last 3 Encounters:  10/19/22 105/65  07/31/22 119/84  07/19/22 123/70   Physical Exam Vitals reviewed.  Constitutional:      General: He is not in acute distress.    Appearance: Normal appearance. He is obese. He is not ill-appearing.  HENT:     Head: Normocephalic and atraumatic.     Right Ear: External ear normal.     Left Ear: External ear normal. A foreign body (Small, round blue piece of cloth) is present. Tympanic membrane is erythematous and bulging.     Nose: Nose normal. No congestion or rhinorrhea.     Mouth/Throat:     Mouth: Mucous membranes are moist.     Pharynx: Oropharynx is clear.  Eyes:     General: No scleral icterus.    Extraocular Movements: Extraocular movements intact.     Conjunctiva/sclera: Conjunctivae normal.     Pupils: Pupils are equal, round, and reactive to light.  Cardiovascular:     Rate and Rhythm: Normal rate and regular rhythm.     Pulses: Normal pulses.     Heart sounds: Normal heart sounds. No murmur heard. Pulmonary:     Effort: Pulmonary effort is normal.     Breath sounds: Normal breath sounds. No wheezing, rhonchi or rales.  Abdominal:     General: Abdomen is flat. Bowel sounds are normal. There is no distension.     Palpations: Abdomen is soft.     Tenderness: There is no abdominal tenderness.  Musculoskeletal:        General: Swelling present. No deformity. Normal range of motion.     Cervical back: Normal range of motion.     Right lower leg: Edema present.     Left lower leg: Edema present.  Skin:    General: Skin is warm and dry.     Capillary Refill: Capillary refill takes less than 2 seconds.     Findings: Lesion (Tender mass present along the volar aspect of the right wrist) present.  Neurological:     General: No  focal deficit present.     Mental Status: He is alert and oriented to person, place, and time.     Motor: No weakness.  Psychiatric:        Mood and Affect: Mood normal.        Behavior: Behavior normal.        Thought Content: Thought content normal.   Last CBC Lab Results  Component Value Date   WBC 8.8 11/20/2021   HGB 14.5  11/20/2021   HCT 43.7 11/20/2021   MCV 90.5 11/20/2021   MCH 30.0 11/20/2021   RDW 13.9 11/20/2021   PLT 292 11/20/2021   Last metabolic panel Lab Results  Component Value Date   GLUCOSE 107 (H) 04/07/2022   NA 140 04/07/2022   K 4.0 04/07/2022   CL 94 (L) 04/07/2022   CO2 28 04/07/2022   BUN 13 04/07/2022   CREATININE 1.15 04/07/2022   EGFR 74 04/07/2022   CALCIUM 10.0 04/07/2022   PROT 7.4 12/13/2021   ALBUMIN 4.6 12/13/2021   LABGLOB 2.8 12/13/2021   AGRATIO 1.6 12/13/2021   BILITOT 0.8 12/13/2021   ALKPHOS 102 12/13/2021   AST 34 12/13/2021   ALT 45 (H) 12/13/2021   ANIONGAP 8 11/20/2021   Last lipids Lab Results  Component Value Date   CHOL 147 01/23/2022   HDL 30 (L) 01/23/2022   LDLCALC 71 01/23/2022   TRIG 282 (H) 01/23/2022   CHOLHDL 4.9 01/23/2022   Last hemoglobin A1c Lab Results  Component Value Date   HGBA1C 6.2 (H) 09/22/2021   Last thyroid functions Lab Results  Component Value Date   TSH 1.410 09/13/2021   Last vitamin D Lab Results  Component Value Date   VD25OH 55.3 04/07/2022   Last vitamin B12 and Folate Lab Results  Component Value Date   VITAMINB12 272 09/22/2021   FOLATE >20.0 09/22/2021     Assessment & Plan:   Problem List Items Addressed This Visit       COPD  GOLD 3    Currently prescribed Breztri and albuterol.  He is not requiring his albuterol inhaler regularly.  Pulmonary exam is unremarkable.  No medication changes have been made today.      Hepatic steatosis    Followed by neurology at [facility].  Recently seen for follow-up on 10/1 and was noted to have hepatic steatosis.  Weight loss  is the best treatment.  GLP-1 therapy recommended per GI. -Through shared decision making, Ozempic has been prescribed today -Follow-up in 3 months for reassessment      Type 2 diabetes mellitus without complications (HCC)    Currently prescribed metformin XR 500 mg daily.  Repeat A1c today is 6.5. -In the setting of hepatic steatosis and T2DM, Ozempic has been prescribed today.  Mr. Eyring is in agreement with this plan. -Follow-up in 3 months for reassessment      Left acute otitis media    He endorses left ear pain today.  Bulging, erythematous TM noted on exam as well as a small, round blue piece of cloth. -Augmentin x 7 days prescribed today      Bilateral lower extremity edema    Symptoms remain largely unchanged.  He is using Lasix 40 mg 3-4 times daily as needed.  Waist high compression stockings have previously been prescribed.  Starting Ozempic today to address weight loss and diabetes mellitus as otherwise documented.      Acute hip pain, left    His acute concern today is left anterior hip pain in the setting of a recent fall.  ROM is generally intact, however he experiences pain with ambulation and with rising from a seated position.  X-rays requested. -Left hip x-rays ordered today      Mass of joint of right hand    Previously referred to orthopedic surgery for evaluation of a painful mass developing along the volar aspect of the right wrist at the site that he has previously undergone carpal tunnel release surgery.  He has not been able to establish care. -Patient has been provided with contact information for orthopedic surgery -We will follow-up with our referral coordinator to facilitate this referral      Return in about 3 months (around 01/19/2023).   Billie Lade, MD

## 2022-10-19 NOTE — Assessment & Plan Note (Signed)
He endorses left ear pain today.  Bulging, erythematous TM noted on exam as well as a small, round blue piece of cloth. -Augmentin x 7 days prescribed today

## 2022-10-19 NOTE — Assessment & Plan Note (Signed)
Followed by neurology at [facility].  Recently seen for follow-up on 10/1 and was noted to have hepatic steatosis.  Weight loss is the best treatment.  GLP-1 therapy recommended per GI. -Through shared decision making, Ozempic has been prescribed today -Follow-up in 3 months for reassessment

## 2022-10-19 NOTE — Assessment & Plan Note (Signed)
Currently prescribed Breztri and albuterol.  He is not requiring his albuterol inhaler regularly.  Pulmonary exam is unremarkable.  No medication changes have been made today.

## 2022-10-19 NOTE — Assessment & Plan Note (Signed)
Currently prescribed metformin XR 500 mg daily.  Repeat A1c today is 6.5. -In the setting of hepatic steatosis and T2DM, Ozempic has been prescribed today.  Jerry Aguirre is in agreement with this plan. -Follow-up in 3 months for reassessment

## 2022-10-19 NOTE — Patient Instructions (Signed)
It was a pleasure to see you today.  Thank you for giving [name] the opportunity to be involved in your care.  Below is a brief recap of your visit and next steps.  We will plan to see you again in 3 months.  Summary Start Ozempic for diabetes and weight loss Augmentin prescribed for treatment of left ear infection We will follow up on the orthopedic surgery referral Follow up in 3 months

## 2022-10-19 NOTE — Assessment & Plan Note (Signed)
Previously referred to orthopedic surgery for evaluation of a painful mass developing along the volar aspect of the right wrist at the site that he has previously undergone carpal tunnel release surgery.  He has not been able to establish care. -Patient has been provided with contact information for orthopedic surgery -We will follow-up with our referral coordinator to facilitate this referral

## 2022-10-19 NOTE — Assessment & Plan Note (Signed)
His acute concern today is left anterior hip pain in the setting of a recent fall.  ROM is generally intact, however he experiences pain with ambulation and with rising from a seated position.  X-rays requested. -Left hip x-rays ordered today

## 2022-10-20 ENCOUNTER — Other Ambulatory Visit: Payer: Self-pay | Admitting: Internal Medicine

## 2022-10-20 ENCOUNTER — Other Ambulatory Visit: Payer: Self-pay | Admitting: Orthopedic Surgery

## 2022-10-20 DIAGNOSIS — M48061 Spinal stenosis, lumbar region without neurogenic claudication: Secondary | ICD-10-CM

## 2022-10-20 DIAGNOSIS — R600 Localized edema: Secondary | ICD-10-CM

## 2022-10-23 LAB — BAYER DCA HB A1C WAIVED: HB A1C (BAYER DCA - WAIVED): 6.5 % — ABNORMAL HIGH (ref 4.8–5.6)

## 2022-10-31 ENCOUNTER — Other Ambulatory Visit: Payer: Self-pay

## 2022-10-31 ENCOUNTER — Ambulatory Visit: Payer: Medicaid Other | Admitting: Podiatry

## 2022-10-31 DIAGNOSIS — H6692 Otitis media, unspecified, left ear: Secondary | ICD-10-CM

## 2022-11-08 ENCOUNTER — Encounter (INDEPENDENT_AMBULATORY_CARE_PROVIDER_SITE_OTHER): Payer: Self-pay | Admitting: Otolaryngology

## 2022-11-09 ENCOUNTER — Other Ambulatory Visit: Payer: Self-pay | Admitting: Internal Medicine

## 2022-11-09 ENCOUNTER — Other Ambulatory Visit: Payer: Self-pay | Admitting: Orthopedic Surgery

## 2022-11-10 ENCOUNTER — Other Ambulatory Visit: Payer: Self-pay | Admitting: Internal Medicine

## 2022-11-10 DIAGNOSIS — E119 Type 2 diabetes mellitus without complications: Secondary | ICD-10-CM

## 2022-11-10 MED ORDER — METFORMIN HCL ER 500 MG PO TB24: 500.0000 mg | ORAL_TABLET | Freq: Every day | ORAL | 0 refills | Status: DC

## 2022-11-10 MED ORDER — METOPROLOL SUCCINATE ER 25 MG PO TB24: 25.0000 mg | ORAL_TABLET | Freq: Every day | ORAL | 0 refills | Status: DC

## 2022-11-10 MED ORDER — ACCU-CHEK GUIDE VI STRP
ORAL_STRIP | 0 refills | Status: DC
Start: 2022-11-10 — End: 2023-02-01

## 2022-11-10 MED ORDER — CETIRIZINE HCL 10 MG PO TABS: 10.0000 mg | ORAL_TABLET | Freq: Every day | ORAL | 0 refills | Status: DC

## 2022-11-22 ENCOUNTER — Other Ambulatory Visit: Payer: Self-pay

## 2022-11-22 MED ORDER — ONDANSETRON HCL 4 MG PO TABS: 4.0000 mg | ORAL_TABLET | Freq: Two times a day (BID) | ORAL | 0 refills | Status: DC

## 2022-11-23 ENCOUNTER — Other Ambulatory Visit (HOSPITAL_COMMUNITY): Payer: Self-pay | Admitting: Family Medicine

## 2022-11-23 DIAGNOSIS — S46211D Strain of muscle, fascia and tendon of other parts of biceps, right arm, subsequent encounter: Secondary | ICD-10-CM

## 2022-11-23 DIAGNOSIS — M25511 Pain in right shoulder: Secondary | ICD-10-CM

## 2022-11-24 ENCOUNTER — Other Ambulatory Visit: Payer: Self-pay | Admitting: Internal Medicine

## 2022-11-24 DIAGNOSIS — M48061 Spinal stenosis, lumbar region without neurogenic claudication: Secondary | ICD-10-CM

## 2022-11-27 ENCOUNTER — Other Ambulatory Visit: Payer: Self-pay | Admitting: Orthopedic Surgery

## 2022-11-27 ENCOUNTER — Other Ambulatory Visit: Payer: Self-pay | Admitting: Internal Medicine

## 2022-11-27 DIAGNOSIS — E785 Hyperlipidemia, unspecified: Secondary | ICD-10-CM

## 2022-11-27 DIAGNOSIS — K760 Fatty (change of) liver, not elsewhere classified: Secondary | ICD-10-CM

## 2022-11-27 DIAGNOSIS — E119 Type 2 diabetes mellitus without complications: Secondary | ICD-10-CM

## 2022-11-27 MED ORDER — OZEMPIC (0.25 OR 0.5 MG/DOSE) 2 MG/3ML ~~LOC~~ SOPN: 0.5000 mg | PEN_INJECTOR | SUBCUTANEOUS | 0 refills | Status: DC

## 2022-11-28 MED ORDER — ATORVASTATIN CALCIUM 40 MG PO TABS: 40.0000 mg | ORAL_TABLET | Freq: Every day | ORAL | 0 refills | Status: DC

## 2022-11-29 ENCOUNTER — Ambulatory Visit (HOSPITAL_COMMUNITY)
Admission: RE | Admit: 2022-11-29 | Discharge: 2022-11-29 | Disposition: A | Discharge status: Home / Self Care | Payer: Medicaid Other | Source: Ambulatory Visit | Attending: Family Medicine | Admitting: Family Medicine

## 2022-11-29 ENCOUNTER — Encounter: Payer: Self-pay | Admitting: Internal Medicine

## 2022-11-29 ENCOUNTER — Ambulatory Visit: Payer: Medicaid Other | Admitting: Internal Medicine

## 2022-11-29 VITALS — BP 103/68 | HR 85 | Ht 64.0 in | Wt 199.8 lb

## 2022-11-29 DIAGNOSIS — M25511 Pain in right shoulder: Secondary | ICD-10-CM | POA: Diagnosis present

## 2022-11-29 DIAGNOSIS — S46211D Strain of muscle, fascia and tendon of other parts of biceps, right arm, subsequent encounter: Secondary | ICD-10-CM | POA: Insufficient documentation

## 2022-11-29 DIAGNOSIS — E08621 Diabetes mellitus due to underlying condition with foot ulcer: Secondary | ICD-10-CM | POA: Diagnosis not present

## 2022-11-29 DIAGNOSIS — S91301A Unspecified open wound, right foot, initial encounter: Secondary | ICD-10-CM | POA: Diagnosis not present

## 2022-11-29 DIAGNOSIS — S91309A Unspecified open wound, unspecified foot, initial encounter: Secondary | ICD-10-CM

## 2022-11-29 DIAGNOSIS — S91302A Unspecified open wound, left foot, initial encounter: Secondary | ICD-10-CM | POA: Diagnosis not present

## 2022-11-29 DIAGNOSIS — L97529 Non-pressure chronic ulcer of other part of left foot with unspecified severity: Secondary | ICD-10-CM

## 2022-11-29 DIAGNOSIS — L97519 Non-pressure chronic ulcer of other part of right foot with unspecified severity: Secondary | ICD-10-CM | POA: Insufficient documentation

## 2022-11-29 NOTE — Patient Instructions (Signed)
It was a pleasure to see you today.  Thank you for giving [name] the opportunity to be involved in your care.  Below is a brief recap of your visit and next steps.  We will plan to see you again in January.  Summary Proper foot and wound care reviewed. Podiatry appointment Monday Please return to care if there is increase redness, pain, or the size of the wounds increase. Follow up in January as scheduled

## 2022-11-29 NOTE — Progress Notes (Signed)
   Acute Office Visit  Subjective:     Patient ID: Jerry Aguirre, male    DOB: 03-Dec-1965, 57 y.o.   MRN: [id number]  Chief Complaint  Patient presents with   sores on feet    Sores on bottom of the feet wont heal    [name] presents today for acute visit for evaluation of sores that have developed on the bottom of both feet.  He states that he started itching his feet approximately 3 weeks ago and shortly after noticed wounds on the plantar aspect of the midfoot.  His home health aide has been cleaning his wounds and he has found that Vicks vapor rub is the only thing that relieves the itching.  He has previously been followed by podiatry, last seen in July, but skipped his appointment in October as he did not see the utility in having them cut his toenails when his nephew was capable of doing it.  His sister has scheduled an appointment with a new podiatrist for Monday (11/18).  Review of Systems  Skin:        Wounds on the bottom of each foot  All other systems reviewed and are negative.     Objective:    BP 103/68 (BP Location: Left Arm, Patient Position: Sitting, Cuff Size: Normal)   Pulse 85   Ht 5\' 4"  (1.626 m)   Wt 199 lb 12.8 oz (90.6 kg)   SpO2 90%   BMI 34.30 kg/m   Physical Exam Skin:    Findings: Lesion (1 x 1 cm early stage diabetic foot ulcer present on the plantar aspect of the left lateral midfoot.  No significant erythema or purulence is appreciated.  There is also a 0.5 x 0.5 cm blister present along the plantar aspect of the right medial midfoot) present.       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  He has previously been followed by podiatry, last seen in July.  Skipped his appointment last month as  he did not see the utility when his nephew was capable of trimming his toenails.  He has a home health aide that has been managing his wounds.  Low concern for infection currently.  His sister has scheduled an appointment with a new podiatrist for 11/18.  In the interim, we reviewed proper wound cleaning and foot hygiene.  He will return to care for previously scheduled follow-up with me in early January.      Return if symptoms worsen or fail to improve.  Billie Lade, MD

## 2022-11-29 NOTE — Assessment & Plan Note (Signed)
Presenting today for an acute visit for evaluation of bilateral foot wounds that appear most consistent with early stage diabetic foot ulcers.  He has previously been followed by podiatry, last seen in July.  Skipped his appointment last month as he did not see the utility when his nephew was capable of trimming his toenails.  He has a home health aide that has been managing his wounds.  Low concern for infection currently.  His sister has scheduled an appointment with a new podiatrist for 11/18.  In the interim, we reviewed proper wound cleaning and foot hygiene.  He will return to care for previously scheduled follow-up with me in early January.

## 2022-12-05 ENCOUNTER — Encounter (INDEPENDENT_AMBULATORY_CARE_PROVIDER_SITE_OTHER): Payer: Self-pay

## 2022-12-05 ENCOUNTER — Ambulatory Visit (INDEPENDENT_AMBULATORY_CARE_PROVIDER_SITE_OTHER): Payer: Medicaid Other

## 2022-12-05 VITALS — Ht 64.0 in | Wt 200.0 lb

## 2022-12-05 DIAGNOSIS — H66015 Acute suppurative otitis media with spontaneous rupture of ear drum, recurrent, left ear: Secondary | ICD-10-CM

## 2022-12-06 DIAGNOSIS — H66012 Acute suppurative otitis media with spontaneous rupture of ear drum, left ear: Secondary | ICD-10-CM | POA: Insufficient documentation

## 2022-12-06 NOTE — Progress Notes (Signed)
Patient ID: Jerry Aguirre, male   DOB: [birth year], 57 y.o.   MRN: [id number]  CC: Recurrent ear infections  HPI:  Jerry Aguirre is a 57 y.o. male who presents today complaining of frequent recurrent ear infections.  He was previously treated with bilateral myringotomy and tube placement in [place].  According to the patient, he has been experiencing persistent left ear drainage for the past 3 weeks.  He denies any significant otalgia.  His left ear hearing is muffled.  He underwent adenotonsillectomy surgery as a child.  He has no other ENT surgery.  Past Medical History:  Diagnosis Date   COPD (chronic obstructive pulmonary disease) (HCC)    Dyspnea    GERD (gastroesophageal reflux disease)    Headache    Hyperlipidemia    Hypertension    Sleep apnea    pt says his "OSA was mild and they said he did not need a CPAP"    Past Surgical History:  Procedure Laterality Date   BIOPSY  12/22/2021   Procedure: BIOPSY;  Surgeon: Dolores Frame, MD;  [facility];  Service: Gastroenterology;;   COLONOSCOPY WITH PROPOFOL N/A 12/22/2021   Procedure: COLONOSCOPY WITH PROPOFOL;  Surgeon: Dolores Frame, MD;  [facility];  Service: Gastroenterology;  Laterality: N/A;  10:15am, asa 3   ESOPHAGOGASTRODUODENOSCOPY (EGD) WITH PROPOFOL N/A 12/22/2021   Procedure: ESOPHAGOGASTRODUODENOSCOPY (EGD) WITH PROPOFOL;  Surgeon: Dolores Frame, MD;  [facility];  Service: Gastroenterology;  Laterality: N/A;   HERNIA REPAIR Right    inguinal   LUMBAR LAMINECTOMY/DECOMPRESSION MICRODISCECTOMY N/A 04/21/2021   Procedure: LAMINECTOMY, MEDIAL FACETECTOMY LUMBAR FOUR- FIVE;  Surgeon: Bedelia Person, MD;  Location: [facility] OR;  Service: Neurosurgery;  Laterality: N/A;   POLYPECTOMY  12/22/2021   Procedure: POLYPECTOMY;  Surgeon: Dolores Frame, MD;  [facility];  Service: Gastroenterology;;   TONSILLECTOMY     removed as a child     Family History  Problem Relation Age of Onset   Esophageal cancer Mother    Lung cancer Father     Social History:  reports that he has been smoking cigarettes. He has been exposed to tobacco smoke. He has never used smokeless tobacco. He reports that he does not currently use alcohol. He reports current drug use. Drug: Marijuana.  Allergies:  Allergies  Allergen Reactions   Lisinopril Itching    headaches    Prior to Admission medications   Medication Sig Start Date End Date Taking? Authorizing Provider  Accu-Chek Softclix Lancets lancets USE AS DIRECTED TO CHECK BLOOD SUGAR IN THE MORNING, AT NOON, AND AT BEDTIME. 04/27/22  Yes Billie Lade, MD  albuterol (VENTOLIN HFA) 108 (90 Base) MCG/ACT inhaler Inhale 2 puffs into the lungs every 4 (four) hours as needed for wheezing or shortness of breath. 11/08/21  Yes Nyoka Cowden, MD  atorvastatin (LIPITOR) 40 MG tablet Take 1 tablet (40 mg total) by mouth daily. 11/28/22  Yes Billie Lade, MD  azelastine (ASTELIN) 0.1 % nasal spray Place 1 spray into both nostrils 2 (two) times daily. Use in each nostril as directed 04/07/22  Yes Billie Lade, MD  Blood Glucose Monitoring Suppl DEVI 1 each by Does not apply route in the morning, at noon, and at bedtime. May substitute to any manufacturer covered by patient's insurance. 04/27/22  Yes Billie Lade, MD  Budeson-Glycopyrrol-Formoterol 160-9-4.8 MCG/ACT AERO Inhale into the lungs. 07/12/22  Yes [provider]  cetirizine (ZYRTEC)  10 MG tablet Take 1 tablet (10 mg total) by mouth daily. 11/10/22  Yes Billie Lade, MD  Chlorphen-Phenyleph-ASA (ALKA-SELTZER PLUS COLD PO) Take 2 tablets by mouth at bedtime.   Yes [provider]  cyclobenzaprine (FLEXERIL) 10 MG tablet TAKE (1) TABLET BY MOUTH TWICE DAILY AS NEEDED FOR MUSCLE SPASMS. 11/27/22  Yes Oliver Barre, MD  Elastic Bandages & Supports (MEDICAL COMPRESSION STOCKINGS) MISC 2 each by Does not apply route  daily. 11/23/21  Yes Billie Lade, MD  Elastic Bandages & Supports (MEDICAL COMPRESSION STOCKINGS) MISC 1 Device by Does not apply route daily. 07/19/22  Yes Billie Lade, MD  ezetimibe (ZETIA) 10 MG tablet Take 1 tablet (10 mg total) by mouth daily. 10/06/21  Yes Billie Lade, MD  furosemide (LASIX) 40 MG tablet TAKE (1) TABLET BY MOUTH THREE TIMES DAILY AS NEEDED FOR FLUID OR EDEMA 10/20/22  Yes Billie Lade, MD  glucose blood (ACCU-CHEK GUIDE) test strip USE AS DIRECTED TO CHECK BLOOD SUGAR IN THE MORNING, AT NOON, AND AT BEDTIME. 11/10/22  Yes Billie Lade, MD  lactulose (CHRONULAC) 10 GM/15ML solution Take 10 g by mouth at bedtime. 03/21/21  Yes [provider]  meloxicam (MOBIC) 7.5 MG tablet Take 1 tablet (7.5 mg total) by mouth 2 (two) times daily. 11/09/21  Yes Billie Lade, MD  Menthol-Zinc Oxide (GOLD BOND EX) Apply 1 Application topically daily as needed (dry skin).   Yes [provider]  metFORMIN (GLUCOPHAGE-XR) 500 MG 24 hr tablet Take 1 tablet (500 mg total) by mouth daily with breakfast. 11/10/22  Yes Billie Lade, MD  metoprolol succinate (TOPROL-XL) 25 MG 24 hr tablet Take 1 tablet (25 mg total) by mouth daily. 11/10/22  Yes Billie Lade, MD  ondansetron (ZOFRAN) 4 MG tablet Take 1 tablet (4 mg total) by mouth 2 (two) times daily. 11/22/22  Yes Billie Lade, MD  oxyCODONE-acetaminophen (PERCOCET) 10-325 MG tablet TAKE 1 TABLET EVERY 4-6 HOURS. MAX 5 PER DAY FOR 30 DAYS 06/05/22  Yes Billie Lade, MD  pantoprazole (PROTONIX) 40 MG tablet Take 1 tablet (40 mg total) by mouth 2 (two) times daily. 09/20/22 12/19/22 Yes Billie Lade, MD  pramipexole (MIRAPEX) 0.75 MG tablet Take 0.75-1.5 mg by mouth See admin instructions. Take 0.75 mg in the morning and 1.5 mg at night 03/14/21  Yes [provider]  pregabalin (LYRICA) 150 MG capsule TAKE (1) CAPSULE BY MOUTH AT BEDTIME. 11/24/22  Yes Billie Lade, MD   Pseudoeph-Doxylamine-DM-APAP (NYQUIL PO) Take 30 mLs by mouth at bedtime.   Yes [provider]  Semaglutide,0.25 or 0.5MG /DOS, (OZEMPIC, 0.25 OR 0.5 MG/DOSE,) 2 MG/3ML SOPN Inject 0.5 mg into the skin once a week. 11/27/22  Yes Billie Lade, MD  Simethicone 125 MG CAPS Take 1 capsule (125 mg total) by mouth every 6 (six) hours as needed (bloating). 12/22/21  Yes Dolores Frame, MD  SYMBICORT 80-4.5 MCG/ACT inhaler Inhale 2 puffs into the lungs 2 (two) times daily. 12/09/21  Yes [provider]  triamterene-hydrochlorothiazide (DYAZIDE) 37.5-25 MG capsule TAKE ONE CAPSULE BY MOUTH ONCE DAILY. 09/21/22  Yes Billie Lade, MD  Vitamin D, Ergocalciferol, (DRISDOL) 1.25 MG (50000 UNIT) CAPS capsule TAKE 1 CAPSULE BY MOUTH EVERY 7 DAYS. 05/05/22  Yes Billie Lade, MD  XTAMPZA ER 9 MG C12A Take 1 capsule by mouth daily. 12/02/22  Yes [provider]  zolpidem (AMBIEN) 10 MG tablet Take 10 mg  by mouth at bedtime. 04/18/21  Yes [provider]  azithromycin (ZITHROMAX) 250 MG tablet Take 2 on day one then 1 daily x 4 days Patient not taking: Reported on 12/05/2022 01/30/22   Nyoka Cowden, MD    Height 5\' 4"  (1.626 m), weight 200 lb (90.7 kg). Exam: General: Communicates without difficulty, well nourished, no acute distress. Head: Normocephalic, no evidence injury, no tenderness, facial buttresses intact without stepoff. Face/sinus: No tenderness to palpation and percussion. Facial movement is normal and symmetric. Eyes: PERRL, EOMI. No scleral icterus, conjunctivae clear. Neuro: CN II exam reveals vision grossly intact.  No nystagmus at any point of gaze. Ears: Auricles well formed without lesions.  Purulent drainage is noted within the left ear canal.  Under the operating microscope, the left ear canal is debrided with a suction catheter.  Both T tubes are in place and patent.  Nose: External evaluation reveals normal support and skin without lesions.   Dorsum is intact.  Anterior rhinoscopy reveals congested mucosa over anterior aspect of inferior turbinates and intact septum.  No purulence noted. Oral:  Oral cavity and oropharynx are intact, symmetric, without erythema or edema.  Mucosa is moist without lesions. Neck: Full range of motion without pain.  There is no significant lymphadenopathy.  No masses palpable.  Thyroid bed within normal limits to palpation.  Parotid glands and submandibular glands equal bilaterally without mass.  Trachea is midline. Neuro:  CN 2-12 grossly intact.    Assessment: 1.  Acute left otitis media with purulent otorrhea.   2.  Both T tubes are in place and patent.  Plan: 1.  Otomicroscopy with debridement of the left ear canal. 2.  The physical exam findings are reviewed with the patient. 3.  Bilateral dry ear precautions. 4.  Ciprodex eardrops 4 drops left ear twice daily for 10 days. 5.  The patient will return for reevaluation in 2 weeks.  Jerry Aguirre W Nygel Prokop 12/06/2022, 9:27 AM

## 2022-12-07 ENCOUNTER — Encounter: Payer: Self-pay | Admitting: Internal Medicine

## 2022-12-07 ENCOUNTER — Other Ambulatory Visit: Payer: Self-pay

## 2022-12-07 DIAGNOSIS — R600 Localized edema: Secondary | ICD-10-CM

## 2022-12-07 MED ORDER — FUROSEMIDE 40 MG PO TABS: ORAL_TABLET | ORAL | 2 refills | Status: DC

## 2022-12-07 MED ORDER — PRAMIPEXOLE DIHYDROCHLORIDE 0.75 MG PO TABS: 0.7500 mg | ORAL_TABLET | ORAL | 2 refills | Status: DC

## 2022-12-07 NOTE — Telephone Encounter (Signed)
I refilled the furosemide but he is asking for mirapex refill as well. Ok to refill? And if you have not already ordered the hospital bed, I can order it online through [employer] if that's ok with you?

## 2022-12-13 ENCOUNTER — Telehealth: Payer: Self-pay | Admitting: Orthopedic Surgery

## 2022-12-13 ENCOUNTER — Other Ambulatory Visit: Payer: Self-pay | Admitting: Internal Medicine

## 2022-12-13 DIAGNOSIS — E785 Hyperlipidemia, unspecified: Secondary | ICD-10-CM

## 2022-12-13 DIAGNOSIS — E119 Type 2 diabetes mellitus without complications: Secondary | ICD-10-CM

## 2022-12-13 MED ORDER — CETIRIZINE HCL 10 MG PO TABS: 10.0000 mg | ORAL_TABLET | Freq: Every day | ORAL | 0 refills | Status: DC

## 2022-12-13 MED ORDER — TRIAMTERENE-HCTZ 37.5-25 MG PO CAPS: 1.0000 | ORAL_CAPSULE | Freq: Every day | ORAL | 0 refills | Status: DC

## 2022-12-13 MED ORDER — CYCLOBENZAPRINE HCL 10 MG PO TABS: 10.0000 mg | ORAL_TABLET | Freq: Three times a day (TID) | ORAL | 0 refills | Status: DC | PRN

## 2022-12-13 MED ORDER — METFORMIN HCL ER 500 MG PO TB24: 500.0000 mg | ORAL_TABLET | Freq: Every day | ORAL | 0 refills | Status: DC

## 2022-12-13 MED ORDER — ONDANSETRON HCL 4 MG PO TABS: 4.0000 mg | ORAL_TABLET | Freq: Two times a day (BID) | ORAL | 0 refills | Status: DC

## 2022-12-13 MED ORDER — ATORVASTATIN CALCIUM 40 MG PO TABS: 40.0000 mg | ORAL_TABLET | Freq: Every day | ORAL | 0 refills | Status: DC

## 2022-12-13 MED ORDER — METOPROLOL SUCCINATE ER 25 MG PO TB24: 25.0000 mg | ORAL_TABLET | Freq: Every day | ORAL | 0 refills | Status: DC

## 2022-12-17 ENCOUNTER — Other Ambulatory Visit: Payer: Self-pay | Admitting: Internal Medicine

## 2022-12-29 ENCOUNTER — Other Ambulatory Visit: Payer: Self-pay

## 2022-12-29 DIAGNOSIS — E785 Hyperlipidemia, unspecified: Secondary | ICD-10-CM

## 2022-12-29 MED ORDER — ONDANSETRON HCL 4 MG PO TABS: 4.0000 mg | ORAL_TABLET | Freq: Two times a day (BID) | ORAL | 0 refills | Status: DC

## 2022-12-29 MED ORDER — METOPROLOL SUCCINATE ER 25 MG PO TB24: 25.0000 mg | ORAL_TABLET | Freq: Every day | ORAL | 0 refills | Status: DC

## 2022-12-29 MED ORDER — ATORVASTATIN CALCIUM 40 MG PO TABS: 40.0000 mg | ORAL_TABLET | Freq: Every day | ORAL | 0 refills | Status: DC

## 2023-01-01 ENCOUNTER — Other Ambulatory Visit: Payer: Self-pay | Admitting: Internal Medicine

## 2023-01-01 ENCOUNTER — Telehealth: Payer: Self-pay | Admitting: Internal Medicine

## 2023-01-01 ENCOUNTER — Other Ambulatory Visit: Payer: Self-pay

## 2023-01-01 DIAGNOSIS — M48061 Spinal stenosis, lumbar region without neurogenic claudication: Secondary | ICD-10-CM

## 2023-01-01 DIAGNOSIS — E785 Hyperlipidemia, unspecified: Secondary | ICD-10-CM

## 2023-01-01 MED ORDER — METOPROLOL SUCCINATE ER 25 MG PO TB24: 25.0000 mg | ORAL_TABLET | Freq: Every day | ORAL | 0 refills | Status: DC

## 2023-01-01 MED ORDER — ONDANSETRON HCL 4 MG PO TABS: 4.0000 mg | ORAL_TABLET | Freq: Two times a day (BID) | ORAL | 0 refills | Status: DC

## 2023-01-01 MED ORDER — ATORVASTATIN CALCIUM 40 MG PO TABS: 40.0000 mg | ORAL_TABLET | Freq: Every day | ORAL | 0 refills | Status: DC

## 2023-01-01 NOTE — Telephone Encounter (Signed)
States th at pharm has not received refill request for   Disp Refills Start End  metoprolol succinate (TOPROL-XL) 25 MG 24 hr tablet 30 tablet 0 12/29/2022 --  Take 1 tablet (25 mg total) by mouth daily. - Oral  ondansetron (ZOFRAN) 4 MG tablet 20 tablet 0 12/29/2022 --  Take 1 tablet (4 mg total) by mouth 2 (two) times daily. - Oral  atorvastatin (LIPITOR) 40 MG tablet 30 tablet 0 12/29/2022 --  Take 1 tablet (40 mg total) by mouth daily. - Oral    Needs   meds refilled

## 2023-01-01 NOTE — Telephone Encounter (Signed)
Re-sent to pharmacy.

## 2023-01-03 ENCOUNTER — Other Ambulatory Visit: Payer: Self-pay

## 2023-01-04 ENCOUNTER — Encounter (INDEPENDENT_AMBULATORY_CARE_PROVIDER_SITE_OTHER): Payer: Self-pay

## 2023-01-04 ENCOUNTER — Ambulatory Visit (INDEPENDENT_AMBULATORY_CARE_PROVIDER_SITE_OTHER): Payer: Medicaid Other | Admitting: Otolaryngology

## 2023-01-04 VITALS — Ht 64.0 in | Wt 200.0 lb

## 2023-01-04 DIAGNOSIS — Z8669 Personal history of other diseases of the nervous system and sense organs: Secondary | ICD-10-CM | POA: Diagnosis not present

## 2023-01-04 DIAGNOSIS — Z09 Encounter for follow-up examination after completed treatment for conditions other than malignant neoplasm: Secondary | ICD-10-CM

## 2023-01-04 DIAGNOSIS — H7012 Chronic mastoiditis, left ear: Secondary | ICD-10-CM | POA: Insufficient documentation

## 2023-01-04 DIAGNOSIS — H9202 Otalgia, left ear: Secondary | ICD-10-CM | POA: Diagnosis not present

## 2023-01-04 DIAGNOSIS — Z9629 Presence of other otological and audiological implants: Secondary | ICD-10-CM

## 2023-01-04 DIAGNOSIS — H7203 Central perforation of tympanic membrane, bilateral: Secondary | ICD-10-CM

## 2023-01-04 NOTE — Progress Notes (Signed)
Patient ID: Jerry Aguirre, male   DOB: [birth year], 57 y.o.   MRN: [id number]  Follow-up: Left ear drainage  HPI: The patient is a 57 year old male who returns today for his follow-up evaluation.  He was last seen 2 weeks ago.  At that time, he was noted to have an acute left otitis media with purulent otorrhea.  Both T tubes were in place and patent.  The patient was treated with debridement and Ciprodex eardrops.  The patient returns today reporting improvement in his left ear drainage.  However, he has been experiencing frequent left ear pain over the past 3 to 4 days.  He has completed 10 days of Ciprodex.  He denies any change in his hearing.  Exam: General: Communicates without difficulty, well nourished, no acute distress. Head: Normocephalic, no evidence injury, no tenderness, facial buttresses intact without stepoff. Face/sinus: No tenderness to palpation and percussion. Facial movement is normal and symmetric. Eyes: PERRL, EOMI. No scleral icterus, conjunctivae clear. Neuro: CN II exam reveals vision grossly intact.  No nystagmus at any point of gaze. Ears: Auricles well formed without lesions.  Ear canals are intact without mass or lesion.  No erythema or edema is appreciated.  Both T tubes are in place and patent.  No drainage is noted today.  Both mastoids are firm and nontender to palpation.  Nose: External evaluation reveals normal support and skin without lesions.  Dorsum is intact.  Anterior rhinoscopy reveals congested mucosa over anterior aspect of inferior turbinates and intact septum.  No purulence noted. Oral:  Oral cavity and oropharynx are intact, symmetric, without erythema or edema.  Mucosa is moist without lesions. Neck: Full range of motion without pain.  There is no significant lymphadenopathy.  No masses palpable.  Thyroid bed within normal limits to palpation.  Parotid glands and submandibular glands equal bilaterally without mass.  Trachea is midline. Neuro:  CN 2-12 grossly  intact.   Assessment: 1.  The patient's acute left otitis media has resolved.  No drainage is noted today. 2.  Both T tubes are in place and patent. 3.  Left ear pain of unknown etiology.  Plan: 1.  The physical exam findings are reviewed with the patient. 2.  Continue bilateral dry ear precautions. 3.  Temporal bone CT scan to evaluate for possible cholesteatoma/chronic mastoiditis. 4.  The patient will return for reevaluation in 3 weeks.

## 2023-01-07 DIAGNOSIS — H7203 Central perforation of tympanic membrane, bilateral: Secondary | ICD-10-CM | POA: Insufficient documentation

## 2023-01-11 ENCOUNTER — Telehealth: Payer: Self-pay | Admitting: Orthopaedic Surgery

## 2023-01-11 ENCOUNTER — Other Ambulatory Visit: Payer: Self-pay | Admitting: Internal Medicine

## 2023-01-11 DIAGNOSIS — K760 Fatty (change of) liver, not elsewhere classified: Secondary | ICD-10-CM

## 2023-01-11 DIAGNOSIS — E119 Type 2 diabetes mellitus without complications: Secondary | ICD-10-CM

## 2023-01-11 MED ORDER — METOPROLOL SUCCINATE ER 25 MG PO TB24: 25.0000 mg | ORAL_TABLET | Freq: Every day | ORAL | 0 refills | Status: DC

## 2023-01-11 MED ORDER — CYCLOBENZAPRINE HCL 10 MG PO TABS: 10.0000 mg | ORAL_TABLET | Freq: Three times a day (TID) | ORAL | 0 refills | Status: DC | PRN

## 2023-01-11 MED ORDER — ONDANSETRON HCL 4 MG PO TABS: 4.0000 mg | ORAL_TABLET | Freq: Two times a day (BID) | ORAL | 0 refills | Status: DC

## 2023-01-11 MED ORDER — METFORMIN HCL ER 500 MG PO TB24: 500.0000 mg | ORAL_TABLET | Freq: Every day | ORAL | 0 refills | Status: DC

## 2023-01-11 MED ORDER — OZEMPIC (0.25 OR 0.5 MG/DOSE) 2 MG/3ML ~~LOC~~ SOPN: 0.5000 mg | PEN_INJECTOR | SUBCUTANEOUS | 0 refills | Status: DC

## 2023-01-22 ENCOUNTER — Ambulatory Visit (HOSPITAL_COMMUNITY)
Admission: RE | Admit: 2023-01-22 | Discharge: 2023-01-22 | Disposition: A | Discharge status: Home / Self Care | Payer: Medicaid Other | Source: Ambulatory Visit | Attending: Internal Medicine | Admitting: Internal Medicine

## 2023-01-22 ENCOUNTER — Ambulatory Visit: Payer: Self-pay | Admitting: Internal Medicine

## 2023-01-22 ENCOUNTER — Ambulatory Visit (INDEPENDENT_AMBULATORY_CARE_PROVIDER_SITE_OTHER): Payer: Medicaid Other | Admitting: Internal Medicine

## 2023-01-22 ENCOUNTER — Encounter: Payer: Self-pay | Admitting: Internal Medicine

## 2023-01-22 VITALS — BP 108/67 | HR 69 | Ht 64.0 in | Wt 200.4 lb

## 2023-01-22 DIAGNOSIS — Z125 Encounter for screening for malignant neoplasm of prostate: Secondary | ICD-10-CM

## 2023-01-22 DIAGNOSIS — E559 Vitamin D deficiency, unspecified: Secondary | ICD-10-CM | POA: Diagnosis not present

## 2023-01-22 DIAGNOSIS — Z122 Encounter for screening for malignant neoplasm of respiratory organs: Secondary | ICD-10-CM

## 2023-01-22 DIAGNOSIS — Z87891 Personal history of nicotine dependence: Secondary | ICD-10-CM | POA: Diagnosis present

## 2023-01-22 DIAGNOSIS — E119 Type 2 diabetes mellitus without complications: Secondary | ICD-10-CM

## 2023-01-22 DIAGNOSIS — J449 Chronic obstructive pulmonary disease, unspecified: Secondary | ICD-10-CM | POA: Diagnosis not present

## 2023-01-22 DIAGNOSIS — Z7984 Long term (current) use of oral hypoglycemic drugs: Secondary | ICD-10-CM

## 2023-01-22 DIAGNOSIS — R600 Localized edema: Secondary | ICD-10-CM

## 2023-01-22 DIAGNOSIS — E1169 Type 2 diabetes mellitus with other specified complication: Secondary | ICD-10-CM

## 2023-01-22 DIAGNOSIS — R3911 Hesitancy of micturition: Secondary | ICD-10-CM

## 2023-01-22 DIAGNOSIS — I10 Essential (primary) hypertension: Secondary | ICD-10-CM

## 2023-01-22 DIAGNOSIS — F17210 Nicotine dependence, cigarettes, uncomplicated: Secondary | ICD-10-CM | POA: Diagnosis present

## 2023-01-22 DIAGNOSIS — K760 Fatty (change of) liver, not elsewhere classified: Secondary | ICD-10-CM

## 2023-01-22 MED ORDER — SEMAGLUTIDE (1 MG/DOSE) 4 MG/3ML ~~LOC~~ SOPN: 1.0000 mg | PEN_INJECTOR | SUBCUTANEOUS | 2 refills | Status: DC

## 2023-01-22 MED ORDER — BACLOFEN 10 MG PO TABS: 10.0000 mg | ORAL_TABLET | Freq: Two times a day (BID) | ORAL | 0 refills | Status: DC | PRN

## 2023-01-22 MED ORDER — FUROSEMIDE 40 MG PO TABS: ORAL_TABLET | ORAL | 2 refills | Status: DC

## 2023-01-22 MED ORDER — VITAMIN D (ERGOCALCIFEROL) 1.25 MG (50000 UNIT) PO CAPS: 50000.0000 [IU] | ORAL_CAPSULE | ORAL | 0 refills | Status: DC

## 2023-01-22 MED ORDER — ALBUTEROL SULFATE HFA 108 (90 BASE) MCG/ACT IN AERS: 2.0000 | INHALATION_SPRAY | RESPIRATORY_TRACT | 11 refills | Status: DC | PRN

## 2023-01-22 MED ORDER — TAMSULOSIN HCL 0.4 MG PO CAPS: 0.4000 mg | ORAL_CAPSULE | Freq: Every day | ORAL | 3 refills | Status: DC

## 2023-01-22 MED ORDER — BUDESON-GLYCOPYRROL-FORMOTEROL 160-9-4.8 MCG/ACT IN AERO: 2.0000 | INHALATION_SPRAY | Freq: Two times a day (BID) | RESPIRATORY_TRACT | 3 refills | Status: DC

## 2023-01-22 MED ORDER — PRAMIPEXOLE DIHYDROCHLORIDE 0.75 MG PO TABS: 0.7500 mg | ORAL_TABLET | ORAL | 2 refills | Status: DC

## 2023-01-22 NOTE — Patient Instructions (Signed)
 It was a pleasure to see you today.  Thank you for giving us  the opportunity to be involved in your care.  Below is a brief recap of your visit and next steps.  We will plan to see you again in 3 months.  Summary Increase Ozempic  to 1 mg weekly Try Flomax  for relief of urinary  Repeat labs ordered Follow up in 3 months

## 2023-01-22 NOTE — Progress Notes (Signed)
 Established Patient Office Visit  Subjective   Patient ID: Jerry Aguirre, male    DOB: 1965-02-06  Age: 58 y.o. MRN: [id number]  Chief Complaint  Patient presents with   Diabetes    Follow up    Urinary Retention    Patient feels he needs to go but can't    Mr. Tietze returns to care today for routine follow-up.  He was last evaluated by me on 11/13 in the setting of bilateral foot wounds.  I recommended that he return to care with podiatry and he planned to see a new podiatrist on 11/18.  Previously seen by me for routine follow-up in 10/3 at which time Ozempic  was started in the setting of T2DM and hepatic steatosis.  In the interim, he has been evaluated by ENT and orthopedic surgery.  Mr. Tackitt reports feeling fairly well today.  He is currently out of Breztri  and endorses expiratory wheezes.  Denies increased cough or sputum production.  His additional concern is urinary hesitancy.  He describes an urge to void but occasionally not feeling as though he is able to completely empty his bladder.  Denies dysuria and hematuria.  Past Medical History:  Diagnosis Date   COPD (chronic obstructive pulmonary disease) (HCC)    Dyspnea    GERD (gastroesophageal reflux disease)    Headache    Hyperlipidemia    Hypertension    Sleep apnea    pt says his OSA was mild and they said he did not need a CPAP   Past Surgical History:  Procedure Laterality Date   BIOPSY  12/22/2021   Procedure: BIOPSY;  Surgeon: Eartha Angelia Sieving, MD;  [facility]: [facility];  Service: Gastroenterology;;   COLONOSCOPY WITH PROPOFOL  N/A 12/22/2021   Procedure: COLONOSCOPY WITH PROPOFOL ;  Surgeon: Eartha Angelia Sieving, MD;  Location: [facility];  Service: Gastroenterology;  Laterality: N/A;  10:15am, asa 3   ESOPHAGOGASTRODUODENOSCOPY (EGD) WITH PROPOFOL  N/A 12/22/2021   Procedure: ESOPHAGOGASTRODUODENOSCOPY (EGD) WITH PROPOFOL ;  Surgeon: Eartha Angelia Sieving, MD;  Location: [facility];   Service: Gastroenterology;  Laterality: N/A;   HERNIA REPAIR Right    inguinal   LUMBAR LAMINECTOMY/DECOMPRESSION MICRODISCECTOMY N/A 04/21/2021   Procedure: LAMINECTOMY, MEDIAL FACETECTOMY LUMBAR FOUR- FIVE;  Surgeon: Debby Dorn MATSU, MD;  Location[facility];  Service: Neurosurgery;  Laterality: N/A;   POLYPECTOMY  12/22/2021   Procedure: POLYPECTOMY;  Surgeon: Eartha Angelia Sieving, MD;  Location: [facility];  Service: Gastroenterology;;   TONSILLECTOMY     removed as a child   Social History   Tobacco Use   Smoking status: Every Day    Current packs/day: 1.00    Types: Cigarettes    Passive exposure: Current   Smokeless tobacco: Never   Tobacco comments:    Less than 1 pack per day  Vaping Use   Vaping status: Never Used  Substance Use Topics   Alcohol use: Not Currently   Drug use: Yes    Types: Marijuana    Comment: twice a week   Family History  Problem Relation Age of Onset   Esophageal cancer Mother    Lung cancer Father    Allergies  Allergen Reactions   Lisinopril  Itching    headaches   Review of Systems  Respiratory:  Positive for wheezing.   Genitourinary:        Hesitancy  All other systems reviewed and are negative.     Objective:     BP 108/67 (BP Location: Left  Arm, Patient Position: Sitting, Cuff Size: Normal)   Pulse 69   Ht 5' 4 (1.626 m)   Wt 200 lb 6.4 oz (90.9 kg)   SpO2 90%   BMI 34.40 kg/m  BP Readings from Last 3 Encounters:  01/22/23 108/67  11/29/22 103/68  10/19/22 105/65   Physical Exam Vitals reviewed.  Constitutional:      General: He is not in acute distress.    Appearance: Normal appearance. He is obese. He is not ill-appearing.  HENT:     Head: Normocephalic and atraumatic.     Right Ear: External ear normal.     Left Ear: External ear normal.     Nose: Nose normal. No congestion or rhinorrhea.     Mouth/Throat:     Mouth: Mucous membranes are moist.     Pharynx: Oropharynx is clear.  Eyes:     General:  No scleral icterus.    Extraocular Movements: Extraocular movements intact.     Conjunctiva/sclera: Conjunctivae normal.     Pupils: Pupils are equal, round, and reactive to light.  Cardiovascular:     Rate and Rhythm: Normal rate and regular rhythm.     Pulses: Normal pulses.     Heart sounds: Normal heart sounds. No murmur heard. Pulmonary:     Effort: Pulmonary effort is normal.     Breath sounds: Wheezing (Expiratory wheezes bilaterally) present. No rhonchi or rales.  Abdominal:     General: Abdomen is flat. Bowel sounds are normal. There is no distension.     Palpations: Abdomen is soft.     Tenderness: There is no abdominal tenderness.  Musculoskeletal:        General: No swelling or deformity. Normal range of motion.     Cervical back: Normal range of motion.  Skin:    General: Skin is warm and dry.     Capillary Refill: Capillary refill takes less than 2 seconds.  Neurological:     General: No focal deficit present.     Mental Status: He is alert and oriented to person, place, and time.     Motor: No weakness.  Psychiatric:        Mood and Affect: Mood normal.        Behavior: Behavior normal.        Thought Content: Thought content normal.   Last CBC Lab Results  Component Value Date   WBC 8.8 11/20/2021   HGB 14.5 11/20/2021   HCT 43.7 11/20/2021   MCV 90.5 11/20/2021   MCH 30.0 11/20/2021   RDW 13.9 11/20/2021   PLT 292 11/20/2021   Last metabolic panel Lab Results  Component Value Date   GLUCOSE 107 (H) 04/07/2022   NA 140 04/07/2022   K 4.0 04/07/2022   CL 94 (L) 04/07/2022   CO2 28 04/07/2022   BUN 13 04/07/2022   CREATININE 1.15 04/07/2022   EGFR 74 04/07/2022   CALCIUM  10.0 04/07/2022   PROT 7.4 12/13/2021   ALBUMIN 4.6 12/13/2021   LABGLOB 2.8 12/13/2021   AGRATIO 1.6 12/13/2021   BILITOT 0.8 12/13/2021   ALKPHOS 102 12/13/2021   AST 34 12/13/2021   ALT 45 (H) 12/13/2021   ANIONGAP 8 11/20/2021   Last lipids Lab Results  Component  Value Date   CHOL 147 01/23/2022   HDL 30 (L) 01/23/2022   LDLCALC 71 01/23/2022   TRIG 282 (H) 01/23/2022   CHOLHDL 4.9 01/23/2022   Last hemoglobin A1c Lab Results  Component Value Date  HGBA1C 6.5 (H) 10/19/2022   Last thyroid  functions Lab Results  Component Value Date   TSH 1.410 09/13/2021   Last vitamin D  Lab Results  Component Value Date   VD25OH 55.3 04/07/2022   Last vitamin B12 and Folate Lab Results  Component Value Date   VITAMINB12 272 09/22/2021   FOLATE >20.0 09/22/2021     Assessment & Plan:   Problem List Items Addressed This Visit       Essential hypertension   Remains adequately controlled on current antihypertensive regimen.  No medication changes are indicated today.      COPD  GOLD 3 - Primary   Currently prescribed Breztri  and albuterol .  Bilateral expiratory wheezes noted on exam today.  He is currently out of Breztri .  Refills provided today.      Type 2 diabetes mellitus without complications (HCC)   A1c 6.5 on labs from October.  He is currently prescribed metformin  XR 500 mg daily.  Ozempic  was started at his last appointment in the setting of T2DM and hepatic steatosis.  He initially lost 16 pounds but has not experienced any additional weight loss over the last month. -Increase Ozempic  to 1 mg weekly.  Continue metformin  as currently prescribed.      Urinary hesitancy   His acute concern today is urinary hesitancy.  He describes an urge to void but and inability to completely void.  Denies dysuria and has not appreciated any blood in his stool. -Trial Flomax  for symptom relief -Will check PSA with basic labs today      Return in about 3 months (around 04/22/2023).   Manus FORBES Fireman, MD

## 2023-01-22 NOTE — Assessment & Plan Note (Signed)
 Remains adequately controlled on current antihypertensive regimen.  No medication changes are indicated today.

## 2023-01-22 NOTE — Assessment & Plan Note (Signed)
 His acute concern today is urinary hesitancy.  He describes an urge to void but and inability to completely void.  Denies dysuria and has not appreciated any blood in his stool. -Trial Flomax for symptom relief -Will check PSA with basic labs today

## 2023-01-22 NOTE — Assessment & Plan Note (Signed)
 A1c 6.5 on labs from October.  He is currently prescribed metformin  XR 500 mg daily.  Ozempic  was started at his last appointment in the setting of T2DM and hepatic steatosis.  He initially lost 16 pounds but has not experienced any additional weight loss over the last month. -Increase Ozempic  to 1 mg weekly.  Continue metformin  as currently prescribed.

## 2023-01-22 NOTE — Assessment & Plan Note (Signed)
 Currently prescribed Breztri and albuterol.  Bilateral expiratory wheezes noted on exam today.  He is currently out of [place].  Refills provided today.

## 2023-01-23 ENCOUNTER — Other Ambulatory Visit: Payer: Self-pay | Admitting: Internal Medicine

## 2023-01-23 DIAGNOSIS — E785 Hyperlipidemia, unspecified: Secondary | ICD-10-CM

## 2023-01-23 LAB — CBC WITH DIFFERENTIAL/PLATELET
Basophils Absolute: 0.1 10*3/uL (ref 0.0–0.2)
Basos: 1 %
EOS (ABSOLUTE): 0.3 10*3/uL (ref 0.0–0.4)
Eos: 4 %
Hematocrit: 48.7 % (ref 37.5–51.0)
Hemoglobin: 16.3 g/dL (ref 13.0–17.7)
Immature Grans (Abs): 0 10*3/uL (ref 0.0–0.1)
Immature Granulocytes: 0 %
Lymphocytes Absolute: 1.6 10*3/uL (ref 0.7–3.1)
Lymphs: 19 %
MCH: 28.6 pg (ref 26.6–33.0)
MCHC: 33.5 g/dL (ref 31.5–35.7)
MCV: 86 fL (ref 79–97)
Monocytes Absolute: 0.8 10*3/uL (ref 0.1–0.9)
Monocytes: 10 %
Neutrophils Absolute: 5.6 10*3/uL (ref 1.4–7.0)
Neutrophils: 66 %
Platelets: 316 10*3/uL (ref 150–450)
RBC: 5.69 x10E6/uL (ref 4.14–5.80)
RDW: 14.5 % (ref 11.6–15.4)
WBC: 8.4 10*3/uL (ref 3.4–10.8)

## 2023-01-23 LAB — CMP14+EGFR
ALT: 22 [IU]/L (ref 0–44)
AST: 19 [IU]/L (ref 0–40)
Albumin: 4.5 g/dL (ref 3.8–4.9)
Alkaline Phosphatase: 128 [IU]/L — ABNORMAL HIGH (ref 44–121)
BUN/Creatinine Ratio: 12 (ref 9–20)
BUN: 16 mg/dL (ref 6–24)
Bilirubin Total: 0.8 mg/dL (ref 0.0–1.2)
CO2: 24 mmol/L (ref 20–29)
Calcium: 9.6 mg/dL (ref 8.7–10.2)
Chloride: 98 mmol/L (ref 96–106)
Creatinine, Ser: 1.36 mg/dL — ABNORMAL HIGH (ref 0.76–1.27)
Globulin, Total: 2.7 g/dL (ref 1.5–4.5)
Glucose: 85 mg/dL (ref 70–99)
Potassium: 4.3 mmol/L (ref 3.5–5.2)
Sodium: 140 mmol/L (ref 134–144)
Total Protein: 7.2 g/dL (ref 6.0–8.5)
eGFR: 60 mL/min/{1.73_m2} (ref >59)

## 2023-01-23 LAB — PSA: Prostate Specific Ag, Serum: 0.8 ng/mL (ref 0.0–4.0)

## 2023-01-23 LAB — TSH+FREE T4
Free T4: 1.42 ng/dL (ref 0.82–1.77)
TSH: 2.63 u[IU]/mL (ref 0.450–4.500)

## 2023-01-23 LAB — VITAMIN D 25 HYDROXY (VIT D DEFICIENCY, FRACTURES): Vit D, 25-Hydroxy: 26.3 ng/mL — ABNORMAL LOW (ref 30.0–100.0)

## 2023-01-23 LAB — LIPID PANEL
Chol/HDL Ratio: 7 {ratio} — ABNORMAL HIGH (ref 0.0–5.0)
Cholesterol, Total: 174 mg/dL (ref 100–199)
HDL: 25 mg/dL — ABNORMAL LOW (ref >39)
LDL Chol Calc (NIH): 93 mg/dL (ref 0–99)
Triglycerides: 331 mg/dL — ABNORMAL HIGH (ref 0–149)
VLDL Cholesterol Cal: 56 mg/dL — ABNORMAL HIGH (ref 5–40)

## 2023-01-23 LAB — B12 AND FOLATE PANEL
Folate: 15.8 ng/mL (ref >3.0)
Vitamin B-12: 180 pg/mL — ABNORMAL LOW (ref 232–1245)

## 2023-01-23 MED ORDER — ATORVASTATIN CALCIUM 80 MG PO TABS: 80.0000 mg | ORAL_TABLET | Freq: Every day | ORAL | 3 refills | Status: DC

## 2023-01-30 ENCOUNTER — Other Ambulatory Visit: Payer: Self-pay | Admitting: Internal Medicine

## 2023-01-30 DIAGNOSIS — M48061 Spinal stenosis, lumbar region without neurogenic claudication: Secondary | ICD-10-CM

## 2023-01-31 ENCOUNTER — Other Ambulatory Visit: Payer: Self-pay

## 2023-01-31 DIAGNOSIS — F17210 Nicotine dependence, cigarettes, uncomplicated: Secondary | ICD-10-CM

## 2023-01-31 DIAGNOSIS — Z122 Encounter for screening for malignant neoplasm of respiratory organs: Secondary | ICD-10-CM

## 2023-01-31 DIAGNOSIS — Z87891 Personal history of nicotine dependence: Secondary | ICD-10-CM

## 2023-02-01 ENCOUNTER — Encounter: Payer: Self-pay | Admitting: Internal Medicine

## 2023-02-01 ENCOUNTER — Other Ambulatory Visit: Payer: Self-pay

## 2023-02-01 ENCOUNTER — Telehealth: Payer: Self-pay | Admitting: Internal Medicine

## 2023-02-01 MED ORDER — GLUCOSE BLOOD VI STRP: ORAL_STRIP | 12 refills | Status: DC

## 2023-02-01 NOTE — Telephone Encounter (Signed)
Copied from CRM [id number]. Topic: Clinical - Prescription Issue >> Feb 01, 2023  4:54 PM Antony Haste wrote: Reason for CRM: The previous Rx request for the glucose blood test strip is missing the exact directions/instructions for when and how often this should be used. Amy the Pharmacist at [facility] is wanting this to be resent with this included for insurance purposes.  Amy's Callback #:[phone]

## 2023-02-02 ENCOUNTER — Other Ambulatory Visit: Payer: Self-pay

## 2023-02-02 MED ORDER — GLUCOSE BLOOD VI STRP: ORAL_STRIP | 12 refills | Status: DC

## 2023-02-02 NOTE — Telephone Encounter (Signed)
Corrected and sent

## 2023-02-05 ENCOUNTER — Other Ambulatory Visit: Payer: Self-pay

## 2023-02-05 MED ORDER — PANTOPRAZOLE SODIUM 40 MG PO TBEC: 40.0000 mg | DELAYED_RELEASE_TABLET | Freq: Two times a day (BID) | ORAL | 2 refills | Status: DC

## 2023-02-06 ENCOUNTER — Telehealth (INDEPENDENT_AMBULATORY_CARE_PROVIDER_SITE_OTHER): Payer: Self-pay | Admitting: Otolaryngology

## 2023-02-06 NOTE — Telephone Encounter (Signed)
Confirmed appt & location [id number] afm

## 2023-02-07 ENCOUNTER — Encounter (INDEPENDENT_AMBULATORY_CARE_PROVIDER_SITE_OTHER): Payer: Self-pay

## 2023-02-07 ENCOUNTER — Ambulatory Visit (INDEPENDENT_AMBULATORY_CARE_PROVIDER_SITE_OTHER): Payer: Medicaid Other | Admitting: Otolaryngology

## 2023-02-07 VITALS — BP 107/74 | HR 68 | Ht 64.0 in | Wt 190.0 lb

## 2023-02-07 DIAGNOSIS — Z9629 Presence of other otological and audiological implants: Secondary | ICD-10-CM | POA: Diagnosis not present

## 2023-02-07 DIAGNOSIS — H9202 Otalgia, left ear: Secondary | ICD-10-CM

## 2023-02-07 DIAGNOSIS — H6692 Otitis media, unspecified, left ear: Secondary | ICD-10-CM | POA: Diagnosis not present

## 2023-02-07 DIAGNOSIS — H66015 Acute suppurative otitis media with spontaneous rupture of ear drum, recurrent, left ear: Secondary | ICD-10-CM

## 2023-02-07 NOTE — Progress Notes (Unsigned)
Patient ID: Jerry Aguirre, male   DOB: 1965/11/08, 58 y.o.   MRN: [id number]  Follow-up: Left ear pain and drainage   HPI: The patient is a 58 year old male who returns today for his follow-up evaluation.  The patient was previously seen for chronic left ear infection and drainage.  At his last visit in December 2024, his acute left ear infection had resolved.  However, he continued to complain of left ear pain.  His T tubes were in place and patent.  The patient was placed on bilateral dry ear precautions.  A temporal bone CT scan was ordered to evaluate for possible cholesteatoma or chronic mastoiditis.  However, the CT scan was never performed.  The patient returns today complaining of left ear pain for the past 4 days.  He has noted intermittent left ear drainage.  He is currently using Ciprodex eardrops.  The patient is diabetic.  His latest A1c from October was 6.5.    Exam: General: Communicates without difficulty, well nourished, no acute distress. Head: Normocephalic, no evidence injury, no tenderness, facial buttresses intact without stepoff. Face/sinus: No tenderness to palpation and percussion. Facial movement is normal and symmetric. Eyes: PERRL, EOMI. No scleral icterus, conjunctivae clear. Neuro: CN II exam reveals vision grossly intact.  No nystagmus at any point of gaze. Ears: Auricles well formed without lesions.  Ear canals are intact without mass or lesion.  No erythema or edema is appreciated.  Both T tubes are in place and patent.  Slight drainage is noted in the left ear canal.  Both mastoids are firm and nontender to palpation.  Nose: External evaluation reveals normal support and skin without lesions.  Dorsum is intact.  Anterior rhinoscopy reveals congested mucosa over anterior aspect of inferior turbinates and intact septum.  No purulence noted. Oral:  Oral cavity and oropharynx are intact, symmetric, without erythema or edema.  Mucosa is moist without lesions. Neck: Full range of  motion without pain.  There is no significant lymphadenopathy.  No masses palpable.  Thyroid bed within normal limits to palpation.  Parotid glands and submandibular glands equal bilaterally without mass.  Trachea is midline. Neuro:  CN 2-12 grossly intact.    Assessment: 1.  Chronic left otitis media with recurrent left ear drainage. 2.  Both T tubes are in place and patent. 3.  Persistent left ear pain.  Plan: 1.  The physical exam findings are reviewed with the patient. 2.  Continue with Ciprodex eardrops 4 drops left ear twice daily. 3.  Temporal bone CT scan to evaluate for possible left ear cholesteatoma. 4.  The patient will return for reevaluation in 3 to 4 weeks.

## 2023-02-08 ENCOUNTER — Ambulatory Visit (HOSPITAL_COMMUNITY)
Admission: RE | Admit: 2023-02-08 | Discharge: 2023-02-08 | Disposition: A | Discharge status: Home / Self Care | Payer: Medicaid Other | Source: Ambulatory Visit | Attending: Otolaryngology | Admitting: Otolaryngology

## 2023-02-08 DIAGNOSIS — H7012 Chronic mastoiditis, left ear: Secondary | ICD-10-CM | POA: Diagnosis present

## 2023-02-12 ENCOUNTER — Other Ambulatory Visit: Payer: Self-pay | Admitting: Internal Medicine

## 2023-02-12 ENCOUNTER — Telehealth: Payer: Self-pay | Admitting: Orthopaedic Surgery

## 2023-02-12 DIAGNOSIS — E119 Type 2 diabetes mellitus without complications: Secondary | ICD-10-CM

## 2023-02-12 DIAGNOSIS — M48061 Spinal stenosis, lumbar region without neurogenic claudication: Secondary | ICD-10-CM

## 2023-02-12 MED ORDER — BACLOFEN 10 MG PO TABS: 10.0000 mg | ORAL_TABLET | Freq: Two times a day (BID) | ORAL | 0 refills | Status: DC | PRN

## 2023-02-12 MED ORDER — METFORMIN HCL ER 500 MG PO TB24: 500.0000 mg | ORAL_TABLET | Freq: Every day | ORAL | 0 refills | Status: DC

## 2023-02-12 MED ORDER — METOPROLOL SUCCINATE ER 25 MG PO TB24: 25.0000 mg | ORAL_TABLET | Freq: Every day | ORAL | 0 refills | Status: DC

## 2023-02-12 MED ORDER — ONDANSETRON HCL 4 MG PO TABS: 4.0000 mg | ORAL_TABLET | Freq: Two times a day (BID) | ORAL | 0 refills | Status: DC

## 2023-02-12 MED ORDER — CETIRIZINE HCL 10 MG PO TABS: 10.0000 mg | ORAL_TABLET | Freq: Every day | ORAL | 0 refills | Status: DC

## 2023-02-14 MED ORDER — CYCLOBENZAPRINE HCL 10 MG PO TABS: 10.0000 mg | ORAL_TABLET | Freq: Three times a day (TID) | ORAL | 0 refills | Status: DC | PRN

## 2023-03-03 ENCOUNTER — Other Ambulatory Visit: Payer: Self-pay | Admitting: Internal Medicine

## 2023-03-03 DIAGNOSIS — M48061 Spinal stenosis, lumbar region without neurogenic claudication: Secondary | ICD-10-CM

## 2023-03-13 ENCOUNTER — Other Ambulatory Visit: Payer: Self-pay | Admitting: Internal Medicine

## 2023-03-13 ENCOUNTER — Other Ambulatory Visit: Payer: Self-pay | Admitting: Orthopaedic Surgery

## 2023-03-15 ENCOUNTER — Ambulatory Visit (INDEPENDENT_AMBULATORY_CARE_PROVIDER_SITE_OTHER): Payer: Medicaid Other

## 2023-03-16 ENCOUNTER — Other Ambulatory Visit: Payer: Self-pay | Admitting: Internal Medicine

## 2023-03-16 DIAGNOSIS — E119 Type 2 diabetes mellitus without complications: Secondary | ICD-10-CM

## 2023-03-16 DIAGNOSIS — K760 Fatty (change of) liver, not elsewhere classified: Secondary | ICD-10-CM

## 2023-03-16 NOTE — Telephone Encounter (Signed)
 Last Fill: Flexeril: 02/14/23 20 tabs/0 RF     Zofran: 03/13/23 20 tabs/0 RF     Metformin: 02/12/23 30 tabs/0 RF     Zyrtec: 02/12/23 30 tabs/0 RF     Semaglutide: 01/22/23 3 mL/2 RF  Last OV: 01/22/23 Next OV: 04/23/23  Routing to provider for review/authorization.

## 2023-03-16 NOTE — Telephone Encounter (Signed)
 Copied from CRM [id number]. Topic: Clinical - Medication Refill >> Mar 16, 2023  2:45 PM Emylou G wrote: Most Recent Primary Care Visit:  Provider: Christel Mormon E  Department: [facility]  Visit Type: OFFICE VISIT  Date: 01/22/2023  Medication: cyclobenzaprine (FLEXERIL) 10 MG tablet ondansetron (ZOFRAN) 4 MG tablet metFORMIN (GLUCOPHAGE-XR) 500 MG 24 hr tablet cetirizine (ZYRTEC) 10 MG tablet Semaglutide, 1 MG/DOSE, 4 MG/3ML SOPN *** note *** he wasn't 100% sure on the injection name ** please confirm that  Has the patient contacted their pharmacy? Yes (Agent: If no, request that the patient contact the pharmacy for the refill. If patient does not wish to contact the pharmacy document the reason why and proceed with request.) (Agent: If yes, when and what did the pharmacy advise?) they said there wasn't any refills?  Is this the correct pharmacy for this prescription? Yes If no, delete pharmacy and type the correct one.  This is the patient's preferred pharmacy:  [facility] - [place], [place] - [address] [place] [address] Phone: [phone] Fax: [phone]   Has the prescription been filled recently? No - been about 5 weeks  Is the patient out of the medication? Yes - due today?  Has the patient been seen for an appointment in the last year OR does the patient have an upcoming appointment? Yes  Can we respond through MyChart? No - having difficulty with the mychart?  Agent: Please be advised that Rx refills may take up to 3 business days. We ask that you follow-up with your pharmacy.

## 2023-03-23 ENCOUNTER — Other Ambulatory Visit: Payer: Self-pay | Admitting: Internal Medicine

## 2023-03-23 ENCOUNTER — Other Ambulatory Visit: Payer: Self-pay | Admitting: Orthopaedic Surgery

## 2023-03-26 ENCOUNTER — Other Ambulatory Visit: Payer: Self-pay | Admitting: Internal Medicine

## 2023-03-26 ENCOUNTER — Encounter: Payer: Self-pay | Admitting: Internal Medicine

## 2023-03-26 ENCOUNTER — Other Ambulatory Visit: Payer: Self-pay | Admitting: Orthopaedic Surgery

## 2023-03-26 MED ORDER — TRIAMTERENE-HCTZ 37.5-25 MG PO CAPS: 1.0000 | ORAL_CAPSULE | Freq: Every day | ORAL | 0 refills | Status: DC

## 2023-03-26 MED ORDER — CETIRIZINE HCL 10 MG PO TABS
10.0000 mg | ORAL_TABLET | Freq: Every day | ORAL | 0 refills | Status: DC
Start: 2023-03-26 — End: 2023-04-23

## 2023-03-26 MED ORDER — CETIRIZINE HCL 10 MG PO TABS
10.0000 mg | ORAL_TABLET | Freq: Every day | ORAL | 0 refills | Status: DC
Start: 2023-03-26 — End: 2023-03-26

## 2023-03-26 MED ORDER — METOPROLOL SUCCINATE ER 25 MG PO TB24: 25.0000 mg | ORAL_TABLET | Freq: Every day | ORAL | 0 refills | Status: DC

## 2023-03-26 NOTE — Telephone Encounter (Signed)
 Spoke with patients sister. Advised no sooner appointments available at this time. Added to wait list. Is wanting sooner appointment due to lack of medications. Please advise

## 2023-03-27 ENCOUNTER — Other Ambulatory Visit: Payer: Self-pay

## 2023-03-27 DIAGNOSIS — R3911 Hesitancy of micturition: Secondary | ICD-10-CM

## 2023-03-27 MED ORDER — TAMSULOSIN HCL 0.4 MG PO CAPS: 0.4000 mg | ORAL_CAPSULE | Freq: Every day | ORAL | 3 refills | Status: DC

## 2023-03-30 ENCOUNTER — Other Ambulatory Visit: Payer: Self-pay | Admitting: Orthopaedic Surgery

## 2023-03-30 ENCOUNTER — Other Ambulatory Visit: Payer: Self-pay | Admitting: Internal Medicine

## 2023-04-02 ENCOUNTER — Other Ambulatory Visit: Payer: Self-pay | Admitting: Internal Medicine

## 2023-04-02 DIAGNOSIS — E559 Vitamin D deficiency, unspecified: Secondary | ICD-10-CM

## 2023-04-02 DIAGNOSIS — M48061 Spinal stenosis, lumbar region without neurogenic claudication: Secondary | ICD-10-CM

## 2023-04-02 NOTE — Telephone Encounter (Unsigned)
 Copied from CRM [id number]. Topic: Clinical - Medication Refill >> Apr 02, 2023  9:31 AM Nada Libman H wrote: Most Recent Primary Care Visit:  Provider: Christel Mormon E  Department: [facility]  Visit Type: OFFICE VISIT  Date: 01/22/2023  Medication: pregabalin (LYRICA) 150 MG capsule [[id number]] baclofen (LIORESAL) 10 MG tablet [[id number]] pramipexole (MIRAPEX) 0.75 MG tablet [[id number]] Vitamin D, Ergocalciferol, (DRISDOL) 1.25 MG (50000 UNIT) CAPS capsule [[id number]]\ pantoprazole (PROTONIX) 40 MG tablet [[id number]] cyclobenzaprine (FLEXERIL) 10 MG tablet [[id number]    Has the patient contacted their pharmacy? No (Agent: If no, request that the patient contact the pharmacy for the refill. If patient does not wish to contact the pharmacy document the reason why and proceed with request.) (Agent: If yes, when and what did the pharmacy advise?)  Is this the correct pharmacy for this prescription? Yes If no, delete pharmacy and type the correct one.  This is the patient's preferred pharmacy:  [facility] - [place], [place] - [address] [place] [address] Phone: [phone] Fax: [phone]   Has the prescription been filled recently? No  Is the patient out of the medication? Yes  Has the patient been seen for an appointment in the last year OR does the patient have an upcoming appointment? Yes  Can we respond through MyChart? No  Agent: Please be advised that Rx refills may take up to 3 business days. We ask that you follow-up with your pharmacy.

## 2023-04-03 ENCOUNTER — Other Ambulatory Visit: Payer: Self-pay | Admitting: Internal Medicine

## 2023-04-03 DIAGNOSIS — E119 Type 2 diabetes mellitus without complications: Secondary | ICD-10-CM

## 2023-04-03 DIAGNOSIS — K760 Fatty (change of) liver, not elsewhere classified: Secondary | ICD-10-CM

## 2023-04-03 MED ORDER — PREGABALIN 150 MG PO CAPS: 150.0000 mg | ORAL_CAPSULE | Freq: Every day | ORAL | 0 refills | Status: DC | PRN

## 2023-04-03 MED ORDER — PANTOPRAZOLE SODIUM 40 MG PO TBEC: 40.0000 mg | DELAYED_RELEASE_TABLET | Freq: Two times a day (BID) | ORAL | 2 refills | Status: DC

## 2023-04-03 MED ORDER — BACLOFEN 10 MG PO TABS: 10.0000 mg | ORAL_TABLET | Freq: Two times a day (BID) | ORAL | 0 refills | Status: DC | PRN

## 2023-04-03 MED ORDER — VITAMIN D (ERGOCALCIFEROL) 1.25 MG (50000 UNIT) PO CAPS: 50000.0000 [IU] | ORAL_CAPSULE | ORAL | 0 refills | Status: DC

## 2023-04-04 ENCOUNTER — Telehealth (INDEPENDENT_AMBULATORY_CARE_PROVIDER_SITE_OTHER): Payer: Self-pay | Admitting: Otolaryngology

## 2023-04-04 NOTE — Telephone Encounter (Signed)
 Confirmed appt & location [id number] afm

## 2023-04-05 ENCOUNTER — Encounter (INDEPENDENT_AMBULATORY_CARE_PROVIDER_SITE_OTHER): Payer: Self-pay

## 2023-04-05 ENCOUNTER — Ambulatory Visit (INDEPENDENT_AMBULATORY_CARE_PROVIDER_SITE_OTHER): Payer: Medicaid Other | Admitting: Otolaryngology

## 2023-04-05 VITALS — BP 100/64 | HR 68 | Ht 64.0 in | Wt 199.0 lb

## 2023-04-05 DIAGNOSIS — H61102 Unspecified noninfective disorders of pinna, left ear: Secondary | ICD-10-CM

## 2023-04-05 DIAGNOSIS — H6692 Otitis media, unspecified, left ear: Secondary | ICD-10-CM

## 2023-04-05 DIAGNOSIS — H60332 Swimmer's ear, left ear: Secondary | ICD-10-CM

## 2023-04-05 DIAGNOSIS — H7203 Central perforation of tympanic membrane, bilateral: Secondary | ICD-10-CM

## 2023-04-05 MED ORDER — AMOXICILLIN-POT CLAVULANATE 875-125 MG PO TABS: 1.0000 | ORAL_TABLET | Freq: Two times a day (BID) | ORAL | 0 refills | Status: AC

## 2023-04-07 DIAGNOSIS — H60332 Swimmer's ear, left ear: Secondary | ICD-10-CM | POA: Insufficient documentation

## 2023-04-07 NOTE — Progress Notes (Signed)
 Patient ID: Jerry Aguirre, male   DOB: May 17, 1965, 58 y.o.   MRN: [id number]  Follow-up: Chronic left ear infection  HPI: The patient is a 58 year old male who returns today for his follow-up evaluation.  The patient was previously seen for chronic left ear pain and left ear drainage.  At his last visit in January 2025, his T tubes were in place and patent.  Left ear drainage was noted.  He was treated with Ciprodex eardrops.  He also underwent a temporal bone CT scan.  The CT did not show any middle ear or mastoid cholesteatoma.  The patient returns today reporting resolution of his left ear drainage.  Over the past few days, he has noted left auricular pain.  A new lesion was noted on his left pinna.  The lesion is tender to touch.  Exam: General: Communicates without difficulty, well nourished, no acute distress. Head: Normocephalic, no evidence injury, no tenderness, facial buttresses intact without stepoff. Face/sinus: No tenderness to palpation and percussion. Facial movement is normal and symmetric. Eyes: PERRL, EOMI. No scleral icterus, conjunctivae clear. Neuro: CN II exam reveals vision grossly intact.  No nystagmus at any point of gaze. Ears: The right auricle is normal.  A small soft tissue lesion is noted on the lateral aspect of the left pinna.  It is tender to touch.  Both ear canals are normal.  Both T tubes are in place and patent without any drainage.  Nose: External evaluation reveals normal support and skin without lesions.  Dorsum is intact.  Anterior rhinoscopy reveals congested mucosa over anterior aspect of inferior turbinates and intact septum.  No purulence noted. Oral:  Oral cavity and oropharynx are intact, symmetric, without erythema or edema.  Mucosa is moist without lesions. Neck: Full range of motion without pain.  There is no significant lymphadenopathy.  No masses palpable.  Thyroid bed within normal limits to palpation.  Parotid glands and submandibular glands equal  bilaterally without mass.  Trachea is midline. Neuro:  CN 2-12 grossly intact.   Assessment: 1.  The patient's chronic left ear infection has mostly resolved.  No drainage is noted.  His temporal bone CT scan showed no cholesteatoma. 2.  The patient has a painful soft tissue lesion on the left pinna.  Plan: 1.  The physical exam findings and the temporal bone CT results are reviewed with the patient. 2.  Augmentin 875 mg p.o. twice daily for 10 days. 3.  The patient will return for reevaluation in 3 weeks.

## 2023-04-18 ENCOUNTER — Telehealth (INDEPENDENT_AMBULATORY_CARE_PROVIDER_SITE_OTHER): Payer: Self-pay | Admitting: Otolaryngology

## 2023-04-18 NOTE — Telephone Encounter (Signed)
 Confirmed appt & location [id number] afm

## 2023-04-19 ENCOUNTER — Ambulatory Visit (INDEPENDENT_AMBULATORY_CARE_PROVIDER_SITE_OTHER): Admitting: Otolaryngology

## 2023-04-19 ENCOUNTER — Encounter (INDEPENDENT_AMBULATORY_CARE_PROVIDER_SITE_OTHER): Payer: Self-pay

## 2023-04-19 VITALS — BP 94/64 | HR 76 | Ht 64.0 in | Wt 189.0 lb

## 2023-04-19 DIAGNOSIS — D485 Neoplasm of uncertain behavior of skin: Secondary | ICD-10-CM

## 2023-04-19 DIAGNOSIS — Z09 Encounter for follow-up examination after completed treatment for conditions other than malignant neoplasm: Secondary | ICD-10-CM | POA: Diagnosis not present

## 2023-04-19 DIAGNOSIS — Z9629 Presence of other otological and audiological implants: Secondary | ICD-10-CM

## 2023-04-19 DIAGNOSIS — H7203 Central perforation of tympanic membrane, bilateral: Secondary | ICD-10-CM

## 2023-04-19 DIAGNOSIS — Z8669 Personal history of other diseases of the nervous system and sense organs: Secondary | ICD-10-CM

## 2023-04-19 NOTE — Progress Notes (Signed)
 Patient ID: Jerry Aguirre, male   DOB: Nov 03, 1965, 58 y.o.   MRN: [id number]  Follow-up: Left ear infection  HPI: The patient is a 58 year old male who returns today for his follow-up evaluation.  The patient has a history of chronic left ear infection, with frequent left ear pain and left ear drainage.  He was previously treated with bilateral T-tube placement.  At his last visit, his left ear infection had mostly resolved.  No drainage was noted.  However, he was noted to have a painful soft tissue lesion on the left pinna.  He was placed on Augmentin for 10 days.  The patient returns today complaining of persistent painful lesion on the left ear.  He has not noted any drainage or change in his hearing.  Exam: General: Communicates without difficulty, well nourished, no acute distress. Head: Normocephalic, no evidence injury, no tenderness, facial buttresses intact without stepoff. Face/sinus: No tenderness to palpation and percussion. Facial movement is normal and symmetric. Eyes: PERRL, EOMI. No scleral icterus, conjunctivae clear. Neuro: CN II exam reveals vision grossly intact.  No nystagmus at any point of gaze. Ears: The right auricle is normal.  A 5 mm soft tissue lesion is noted on the lateral aspect of the left pinna.  It is tender to touch.  Both ear canals are normal.  Both T tubes are in place and patent without any drainage.  Nose: External evaluation reveals normal support and skin without lesions.  Dorsum is intact.  Anterior rhinoscopy reveals congested mucosa over anterior aspect of inferior turbinates and intact septum.  No purulence noted. Oral:  Oral cavity and oropharynx are intact, symmetric, without erythema or edema.  Mucosa is moist without lesions. Neck: Full range of motion without pain.  There is no significant lymphadenopathy.  No masses palpable.  Thyroid bed within normal limits to palpation.  Parotid glands and submandibular glands equal bilaterally without mass.  Trachea is  midline. Neuro:  CN 2-12 grossly intact.    Assessment: 1.  The patient continues to have a painful 5 mm soft tissue lesion on the left pinna.  The has not responded to antibiotic treatment. 2.  His chronic left ear infection had resolved.  Both T tubes are in place and patent.  Plan: 1.  The physical exam findings are reviewed with the patient. 2.  Based on the above findings, the decision is made to proceed with excision of the left ear lesion. 3.  We will proceed with wedge excision of the left auricular lesion under local anesthesia in my office [phone]).

## 2023-04-21 DIAGNOSIS — D485 Neoplasm of uncertain behavior of skin: Secondary | ICD-10-CM | POA: Insufficient documentation

## 2023-04-23 ENCOUNTER — Ambulatory Visit: Payer: Medicaid Other | Admitting: Internal Medicine

## 2023-04-23 ENCOUNTER — Other Ambulatory Visit: Payer: Self-pay

## 2023-04-23 DIAGNOSIS — E119 Type 2 diabetes mellitus without complications: Secondary | ICD-10-CM

## 2023-04-23 MED ORDER — ONDANSETRON HCL 4 MG PO TABS: 4.0000 mg | ORAL_TABLET | Freq: Two times a day (BID) | ORAL | 0 refills | Status: DC

## 2023-04-23 MED ORDER — METFORMIN HCL ER 500 MG PO TB24
500.0000 mg | ORAL_TABLET | Freq: Every day | ORAL | 0 refills | Status: DC
Start: 2023-04-23 — End: 2023-05-14

## 2023-04-23 MED ORDER — CETIRIZINE HCL 10 MG PO TABS: 10.0000 mg | ORAL_TABLET | Freq: Every day | ORAL | 0 refills | Status: DC

## 2023-04-25 ENCOUNTER — Other Ambulatory Visit: Payer: Self-pay | Admitting: Orthopaedic Surgery

## 2023-04-25 ENCOUNTER — Other Ambulatory Visit (HOSPITAL_COMMUNITY): Payer: Self-pay

## 2023-04-25 ENCOUNTER — Telehealth: Payer: Self-pay | Admitting: Pharmacy Technician

## 2023-04-25 DIAGNOSIS — J449 Chronic obstructive pulmonary disease, unspecified: Secondary | ICD-10-CM

## 2023-04-25 NOTE — Telephone Encounter (Signed)
 Pharmacy Patient Advocate Encounter   Received notification from CoverMyMeds that prior authorization for Breztri Aerosphere 160-9-4.8MCG/ACT aerosol is required/requested.   Insurance verification completed.   The patient is insured through [facility] MEDICAID .   Per test claim:  SEE BELOW is preferred by the insurance.  If suggested medication is appropriate, Please send in a new RX and discontinue this one. If not, please advise as to why it's not appropriate so that we may request a Prior Authorization. Please note, some preferred medications may still require a PA.  If the suggested medications have not been trialed and there are no contraindications to their use, the PA will not be submitted, as it will not be approved.

## 2023-04-26 ENCOUNTER — Ambulatory Visit (INDEPENDENT_AMBULATORY_CARE_PROVIDER_SITE_OTHER): Admitting: Otolaryngology

## 2023-04-26 ENCOUNTER — Encounter (INDEPENDENT_AMBULATORY_CARE_PROVIDER_SITE_OTHER): Payer: Self-pay

## 2023-04-26 ENCOUNTER — Other Ambulatory Visit (HOSPITAL_COMMUNITY)
Admission: RE | Admit: 2023-04-26 | Discharge: 2023-04-26 | Disposition: A | Discharge status: Home / Self Care | Source: Ambulatory Visit | Attending: Otolaryngology | Admitting: Otolaryngology

## 2023-04-26 VITALS — Ht 64.0 in | Wt 185.0 lb

## 2023-04-26 DIAGNOSIS — D485 Neoplasm of uncertain behavior of skin: Secondary | ICD-10-CM | POA: Diagnosis present

## 2023-04-26 MED ORDER — AMOXICILLIN-POT CLAVULANATE 875-125 MG PO TABS: 1.0000 | ORAL_TABLET | Freq: Two times a day (BID) | ORAL | 0 refills | Status: AC

## 2023-04-26 NOTE — Progress Notes (Signed)
 Patient ID: Jerry Aguirre, male   DOB: July 29, 1965, 58 y.o.   MRN: [id number]  Procedure: Wedge excision of a left auricular mass (CPT [phone])  Indication: The patient has an enlarging and painful left auricular mass.  Anesthesia: local anesthesia with 1% lidocaine with epinephrine.    Description: The patient is placed supine on the exam table.  After adequate local anesthesia is achieved with direct injection, a 15 blade is used to make wedge incisions around the 5 mm lesion.  The entire wedge is excised along with the full-thickness skin.  The specimen is sent to the pathologist for histologic identification.  The incision is closed with interrupted Prolene sutures.  The patient tolerated the procedure well.  Follow up: The patient will return in approximately 10 days for suture removal.

## 2023-04-30 ENCOUNTER — Encounter: Payer: Self-pay | Admitting: Internal Medicine

## 2023-04-30 LAB — SURGICAL PATHOLOGY

## 2023-05-02 MED ORDER — BUDESONIDE-FORMOTEROL FUMARATE 80-4.5 MCG/ACT IN AERO: 2.0000 | INHALATION_SPRAY | Freq: Two times a day (BID) | RESPIRATORY_TRACT | 3 refills | Status: DC

## 2023-05-02 NOTE — Telephone Encounter (Signed)
 Update on possible therapy change?

## 2023-05-03 ENCOUNTER — Encounter (HOSPITAL_COMMUNITY): Payer: Self-pay | Admitting: Occupational Therapy

## 2023-05-03 ENCOUNTER — Other Ambulatory Visit: Payer: Self-pay | Admitting: Internal Medicine

## 2023-05-03 ENCOUNTER — Ambulatory Visit (HOSPITAL_COMMUNITY): Attending: Internal Medicine | Admitting: Occupational Therapy

## 2023-05-03 DIAGNOSIS — M25531 Pain in right wrist: Secondary | ICD-10-CM | POA: Diagnosis present

## 2023-05-03 DIAGNOSIS — R29898 Other symptoms and signs involving the musculoskeletal system: Secondary | ICD-10-CM | POA: Insufficient documentation

## 2023-05-03 DIAGNOSIS — M25521 Pain in right elbow: Secondary | ICD-10-CM | POA: Insufficient documentation

## 2023-05-03 DIAGNOSIS — M48061 Spinal stenosis, lumbar region without neurogenic claudication: Secondary | ICD-10-CM

## 2023-05-03 NOTE — Patient Instructions (Signed)
 AROM Exercises  *Complete exercises 10 times each, 3 times per day*  1) Wrist Flexion  Start with wrist at edge of table, palm facing up. With wrist hanging slightly off table, curl wrist upward, and back down.      2) Wrist Extension  Start with wrist at edge of table, palm facing down. With wrist slightly off the edge of the table, curl wrist up and back down.      3) Radial Deviations  Start with forearm flat against a table, wrist hanging slightly off the edge, and palm facing the wall. Bending at the wrist only, and keeping palm facing the wall, bend wrist so fist is pointing towards the floor, back up to start position, and up towards the ceiling. Return to start.        4) WRIST PRONATION  Turn your forearm towards palm face down.  Keep your elbow bent and by the side of your  Body.      5) WRIST SUPINATION  Turn your forearm towards palm face up.  Keep your elbow bent and by the side of your  Body.        Complete each exercise 10X, 2-3X/day  1) Towel crunch Place a small towel on a firm table top. Flatten out the towel and then place your hand on one end of it.  Next, flex your fingers 2-5 (index finger through pinky finger) as you pull the towel towards your hand.    2) Digit composite flexion/adduction (make a fist) Hold your hand up as shown. Open and close your hand into a fist and repeat. If you cannot make a full fist, then make a partial fist.    3) Thumb/finger opposition Touch the tip of the thumb to each fingertip one by one. Extend fingers fully after they are touched.      4) Finger Taps Start with the hand flat and fingers slightly spread.  One at a time, starting with the thumb, lift each finger up separately.       5) Digit Abduction/Adduction Hold hand palm down flat on table. Spread your fingers apart and back together.

## 2023-05-03 NOTE — Therapy (Addendum)
 OUTPATIENT OCCUPATIONAL THERAPY ORTHO EVALUATION  Patient Name: Jerry Aguirre MRN: [id number] DOB:11-02-1965, 58 y.o., male Today's Date: 05/03/2023    END OF SESSION:  OT End of Session - 05/03/23 0847     Visit Number 1    Number of Visits 6    Date for OT Re-Evaluation 06/15/23    Authorization Type [place] Medicaid    Authorization Time Period Requesting 6 visits    Authorization - Visit Number 0    Authorization - Number of Visits 6    OT Start Time 0804    OT Stop Time 0830    OT Time Calculation (min) 26 min    Activity Tolerance Patient tolerated treatment well    Behavior During Therapy WFL for tasks assessed/performed             Past Medical History:  Diagnosis Date   COPD (chronic obstructive pulmonary disease) (HCC)    Dyspnea    GERD (gastroesophageal reflux disease)    Headache    Hyperlipidemia    Hypertension    Sleep apnea    pt says his "OSA was mild and they said he did not need a CPAP"   Past Surgical History:  Procedure Laterality Date   BIOPSY  12/22/2021   Procedure: BIOPSY;  Surgeon: Urban Garden, MD;  [facility];  Service: Gastroenterology;;   COLONOSCOPY WITH PROPOFOL N/A 12/22/2021   Procedure: COLONOSCOPY WITH PROPOFOL;  Surgeon: Urban Garden, MD;  Location: [facility];  Service: Gastroenterology;  Laterality: N/A;  10:15am, asa 3   ESOPHAGOGASTRODUODENOSCOPY (EGD) WITH PROPOFOL N/A 12/22/2021   Procedure: ESOPHAGOGASTRODUODENOSCOPY (EGD) WITH PROPOFOL;  Surgeon: Urban Garden, MD;  Location: [facility];  Service: Gastroenterology;  Laterality: N/A;   HERNIA REPAIR Right    inguinal   LUMBAR LAMINECTOMY/DECOMPRESSION MICRODISCECTOMY N/A 04/21/2021   Procedure: LAMINECTOMY, MEDIAL FACETECTOMY LUMBAR FOUR- FIVE;  Surgeon: Van Gelinas, MD;  [facility];  Service: Neurosurgery;  Laterality: N/A;   POLYPECTOMY  12/22/2021   Procedure: POLYPECTOMY;  Surgeon: Umberto Ganong,  Bearl Limes, MD;  Location: [facility];  Service: Gastroenterology;;   TONSILLECTOMY     removed as a child   Patient Active Problem List   Diagnosis Date Noted   Neoplasm of uncertain behavior of skin of ear 04/21/2023   Left swimmer's ear 04/07/2023   Urinary hesitancy 01/22/2023   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 01/07/2023   Left ear pain 01/04/2023   Chronic mastoiditis of left side 01/04/2023   Acute suppurative otitis media of left ear with spontaneous rupture of tympanic membrane 12/06/2022   Bilateral diabetic foot ulcer associated with secondary diabetes mellitus (HCC) 11/29/2022   Acute hip pain, left 10/19/2022   Hepatic steatosis 10/19/2022   Mass of joint of right hand 10/19/2022   Left acute otitis media 10/19/2022   Pain due to onychomycosis of toenails of both feet 07/31/2022   Mass of right hand 07/19/2022   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   Seasonal allergies 04/13/2022   Type 2 diabetes mellitus without complications (HCC) 03/06/2022   Hypokalemia 03/06/2022   Low vitamin B12 level 03/06/2022   Barrett's esophagus with high grade dysplasia 01/25/2022   Left hand pain 01/25/2022   Positive screening for depression on 9-item Patient Health Questionnaire (PHQ-9) 12/28/2021   Change in bowel habits 12/22/2021   Dysphagia 12/15/2021   Diastasis recti 12/13/2021   Abdominal distention 12/13/2021   Hernia, abdominal 11/29/2021   Bilateral lower  extremity edema 11/16/2021   Right shoulder pain 11/16/2021   Hoarseness 11/08/2021   Vitamin D deficiency 10/06/2021   Obesity (BMI 30-39.9) 10/06/2021   Right flank pain 10/06/2021   Nasal sinus congestion 10/06/2021   Chronic musculoskeletal pain 09/26/2021   RLS (restless legs syndrome) 09/26/2021   Preventative health care 09/26/2021   DOE (dyspnea on exertion) 09/13/2021   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) 07/27/2021   Cigarette smoker 07/27/2021   S/P  laminectomy 04/21/2021   Lumbar stenosis 04/21/2021   Dyslipidemia 07/22/2020   Prediabetes 07/22/2020   Focal neurological deficit 07/21/2020   COPD  GOLD 3 07/21/2020   GERD (gastroesophageal reflux disease) 07/21/2020   Essential hypertension 07/21/2020   Long-term current use of opiate analgesic 06/01/2020   Degeneration of lumbar intervertebral disc 04/25/2020   Edema of lower extremity 11/20/2019   Abnormality of gait due to impairment of balance 10/21/2019   Pain in joint of right hip 08/20/2019   Insomnia 05/01/2019   Fatigue 03/06/2019   PCP: Dr. Christel Mormon  REFERRING PROVIDER: Dr. Christel Mormon  ONSET DATE: 04/12/23  REFERRING DIAG: W09.811 (ICD-10-CM) - Other specified postprocedural states  -revision right carpal tunnel release -Neuroplasty right median nerve in the proximal forearm with lengthening of the pronator teres  -Mass excision right palm  THERAPY DIAG:  Pain in right wrist  Other symptoms and signs involving the musculoskeletal system  Pain in right elbow  Rationale for Evaluation and Treatment: Rehabilitation  SUBJECTIVE:   SUBJECTIVE STATEMENT: S: "I just ain't got not grip with my thumb" Pt accompanied by: self  PERTINENT HISTORY: Pt is a 58 y/o male s/p right carpal tunnel revision, mass excision of right palm, and neuroplasty right median nerve in proximal forearm with lengthening of pronator teres. Pt reports high pain, wears wrist splint most of the day.   PRECAUTIONS: Other: [place] Handbook surgical management of carpal tunnel release  WEIGHT BEARING RESTRICTIONS: Yes NWB  PAIN:  Are you having pain? Yes: NPRS scale: 9/10 Pain location: elbow and wrist Pain description: sore, sharp Aggravating factors: movement, exercises Relieving factors: nothing  FALLS: Has patient fallen in last 6 months? Yes. Number of falls 5  PLOF: Independent  PATIENT GOALS: To be able to use the left hand and arm.  NEXT MD VISIT: 05/18/23 (with  surgeon)  OBJECTIVE:  Note: Objective measures were completed at Evaluation unless otherwise noted.  HAND DOMINANCE: Right  ADLs:   FUNCTIONAL OUTCOME MEASURES: Quick Dash: 65.91  UPPER EXTREMITY ROM:     Active ROM Right eval  Wrist flexion 50  Wrist extension 52  Wrist ulnar deviation 28  Wrist radial deviation 20  Wrist pronation 90  Wrist supination 90  (Blank rows = not tested)   UPPER EXTREMITY MMT:     MMT Right eval  Elbow flexion 5/5  Elbow extension 5/5  Wrist flexion 5/5  Wrist extension 5/5  Wrist ulnar deviation 5/5  Wrist radial deviation 5/5  Wrist pronation 4/5  Wrist supination 5/5  (Blank rows = not tested)  HAND FUNCTION: Grip strength: Right: 46 lbs; Left: 82 lbs, Lateral pinch: Right: 9 lbs, Left: 20 lbs, and 3 point pinch: Right: 12 lbs, Left: 20 lbs  SENSATION: WFL  EDEMA: None  COGNITION: Overall cognitive status: Within functional limits for tasks assessed  OBSERVATIONS: Clean incision area with skin peeling at heel of hand.    TREATMENT DATE:  -Manual techniques: retrograde massage and myofascial release to right wrist and volar  forearm to decrease pain and fascial/scar restrictions and increase joint ROM                                                                                                                                 PATIENT EDUCATION: Education details: Wrist A/ROM, finger A/ROM Person educated: Patient Education method: Explanation, Demonstration, and Handouts Education comprehension: verbalized understanding and returned demonstration  HOME EXERCISE PROGRAM: Eval: Wrist A/ROM, finger A/ROM  GOALS: Goals reviewed with patient? Yes  SHORT TERM GOALS: Target date: 05/24/23  Pt will be provided with and educated on HEP to improve mobility of RUE required for ADL completion.   Goal status: INITIAL   LONG TERM GOALS: Target date: 06/15/23  Pt will decrease pain in RUE to 5/10 or less to improve ability  to grip items and use RUE as dominant during ADLs.    Goal status: INITIAL  2.  Pt will increase grip strength of RUE by 20# and pinch strength by 5# to improve ability to grip and cast a fishing rod.   Goal status: INITIAL  3.  Pt will decrease fascial restrictions in RUE to min amounts or less to improve mobility required for tasks including reaching behind back for washing and mobility required for cutting food.   Goal status: INITIAL  4.  Pt will be educated on AE available to assist with ADLs and reduce stress to joints.   Goal status: INITIAL     ASSESSMENT:  CLINICAL IMPRESSION: Patient is a 58 y.o. male who was seen today for occupational therapy evaluation s/p right revision carpal tunnel syndrome and neuroplasty right median nerve in the proximal forearm with lengthening of the pronator teres. Pt with high pain, sensitivity at incision area on wrist. Pt reports he is wearing his splint most of the day and is not using the RUE for any lifting. Provided HEP, pt unable to attend more than 1x/week due to transportation.     PERFORMANCE DEFICITS: in functional skills including ADLs, IADLs, coordination, dexterity, edema, ROM, strength, pain, fascial restrictions, Fine motor control, and UE functional use  IMPAIRMENTS: are limiting patient from ADLs, IADLs, and leisure.   COMORBIDITIES: has no other co-morbidities that affects occupational performance. Patient will benefit from skilled OT to address above impairments and improve overall function.  MODIFICATION OR ASSISTANCE TO COMPLETE EVALUATION: No modification of tasks or assist necessary to complete an evaluation.  OT OCCUPATIONAL PROFILE AND HISTORY: Problem focused assessment: Including review of records relating to presenting problem.  CLINICAL DECISION MAKING: LOW - limited treatment options, no task modification necessary  REHAB POTENTIAL: Good  EVALUATION COMPLEXITY: Low      PLAN:  OT FREQUENCY:  1x/week  OT DURATION: 6 weeks  PLANNED INTERVENTIONS: 97168 OT Re-evaluation, 97535 self care/ADL training, 47829 therapeutic exercise, 97530 therapeutic activity, 97112 neuromuscular re-education, 97140 manual therapy, 97035 ultrasound, 97018 paraffin, 56213 moist heat, 97014 electrical stimulation unattended, patient/family education, and DME and/or  AE instructions  RECOMMENDED OTHER SERVICES: None at this time  CONSULTED AND AGREED WITH PLAN OF CARE: Patient  PLAN FOR NEXT SESSION: Follow up on HEP, manual techniques, A/ROM, gentle grip and pinch strengthening   Lafonda Piety, OTR/L  [phone] 05/03/2023, 8:47 AM

## 2023-05-07 ENCOUNTER — Telehealth: Payer: Self-pay | Admitting: *Deleted

## 2023-05-07 ENCOUNTER — Ambulatory Visit: Payer: Self-pay | Admitting: Internal Medicine

## 2023-05-07 LAB — HM DIABETES EYE EXAM

## 2023-05-07 NOTE — Telephone Encounter (Signed)
 Patient rescheduled appointment from 05/07/23 but states he needs refills on Zyrtec , Zofran , Ozempic  and Flexeril  until next appointment.    Please call patient to confirm medication refill request. Patient uses [facility] in [place]. Please call [phone]

## 2023-05-08 ENCOUNTER — Telehealth: Payer: Self-pay

## 2023-05-08 NOTE — Telephone Encounter (Signed)
 Copied from CRM [id number]. Topic: Clinical - Request for Lab/Test Order >> May 08, 2023 10:37 AM Gates Kasal P wrote: Reason for CRM: Robin from [facility] at Dr. [name] office called in to advise that pt is diagnosed with Amaurosis fugax at the pts visit with them and pt would like to know if Dr. Kermit Ped can order an MRI contrast of brain to rule out any Ischemic pathology. Corbin Dess will fax over the documented Office Notes of the most recent visit.   Pt would like a follow up, Corbin Dess would like call if approved and scheduled.   Pt callback [phone]  Robin at Dr. Adora Hoover - [phone]

## 2023-05-09 ENCOUNTER — Other Ambulatory Visit: Payer: Self-pay

## 2023-05-09 ENCOUNTER — Ambulatory Visit (HOSPITAL_COMMUNITY): Admitting: Occupational Therapy

## 2023-05-09 ENCOUNTER — Encounter (HOSPITAL_COMMUNITY): Payer: Self-pay | Admitting: Occupational Therapy

## 2023-05-09 DIAGNOSIS — R29898 Other symptoms and signs involving the musculoskeletal system: Secondary | ICD-10-CM

## 2023-05-09 DIAGNOSIS — M25531 Pain in right wrist: Secondary | ICD-10-CM | POA: Diagnosis not present

## 2023-05-09 DIAGNOSIS — M25521 Pain in right elbow: Secondary | ICD-10-CM

## 2023-05-09 DIAGNOSIS — E119 Type 2 diabetes mellitus without complications: Secondary | ICD-10-CM

## 2023-05-09 DIAGNOSIS — K760 Fatty (change of) liver, not elsewhere classified: Secondary | ICD-10-CM

## 2023-05-09 MED ORDER — CYCLOBENZAPRINE HCL 10 MG PO TABS: 10.0000 mg | ORAL_TABLET | Freq: Three times a day (TID) | ORAL | 0 refills | Status: DC | PRN

## 2023-05-09 MED ORDER — OZEMPIC (1 MG/DOSE) 4 MG/3ML ~~LOC~~ SOPN: 1.0000 mg | PEN_INJECTOR | SUBCUTANEOUS | 2 refills | Status: DC

## 2023-05-09 MED ORDER — ONDANSETRON HCL 4 MG PO TABS
4.0000 mg | ORAL_TABLET | Freq: Two times a day (BID) | ORAL | 0 refills | Status: DC
Start: 2023-05-09 — End: 2023-05-14

## 2023-05-09 MED ORDER — CETIRIZINE HCL 10 MG PO TABS: 10.0000 mg | ORAL_TABLET | Freq: Every day | ORAL | 0 refills | Status: DC

## 2023-05-09 NOTE — Telephone Encounter (Signed)
 Patient scheduled 4/28- pt eye doctor is requesting order not the patient

## 2023-05-09 NOTE — Therapy (Addendum)
 OUTPATIENT OCCUPATIONAL THERAPY ORTHO TREATMENT  Patient Name: Jerry Aguirre MRN: [id number] DOB:03-04-1965, 58 y.o., male Today's Date: 05/09/2023  OCCUPATIONAL THERAPY DISCHARGE SUMMARY  Visits from Start of Care: 2  Current functional level related to goals / functional outcomes: Pt requested discharge as MD has released him and stated he does not need additional therapy.    Remaining deficits: Decreased grip and pinch strength, intermittent pain   Education / Equipment: HEP   Patient agrees to discharge. Patient goals were partially met. Patient is being discharged due to the patient's request..     END OF SESSION:  OT End of Session - 05/09/23 1420     Visit Number 2    Number of Visits 6    Date for OT Re-Evaluation 06/15/23    Authorization Type [place] [employer]    Authorization Time Period 6 visits approved 4/23-6/3    Authorization - Visit Number 1    Authorization - Number of Visits 6    OT Start Time 1349    OT Stop Time 1427    OT Time Calculation (min) 38 min    Activity Tolerance Patient tolerated treatment well    Behavior During Therapy WFL for tasks assessed/performed              Past Medical History:  Diagnosis Date   COPD (chronic obstructive pulmonary disease) (HCC)    Dyspnea    GERD (gastroesophageal reflux disease)    Headache    Hyperlipidemia    Hypertension    Sleep apnea    pt says his "OSA was mild and they said he did not need a CPAP"   Past Surgical History:  Procedure Laterality Date   BIOPSY  12/22/2021   Procedure: BIOPSY;  Surgeon: Urban Garden, MD;  [facility];  Service: Gastroenterology;;   COLONOSCOPY WITH PROPOFOL  N/A 12/22/2021   Procedure: COLONOSCOPY WITH PROPOFOL ;  Surgeon: Urban Garden, MD;  [facility]: [facility];  Service: Gastroenterology;  Laterality: N/A;  10:15am, asa 3   ESOPHAGOGASTRODUODENOSCOPY (EGD) WITH PROPOFOL  N/A 12/22/2021   Procedure:  ESOPHAGOGASTRODUODENOSCOPY (EGD) WITH PROPOFOL ;  Surgeon: Urban Garden, MD;  [facility];  Service: Gastroenterology;  Laterality: N/A;   HERNIA REPAIR Right    inguinal   LUMBAR LAMINECTOMY/DECOMPRESSION MICRODISCECTOMY N/A 04/21/2021   Procedure: LAMINECTOMY, MEDIAL FACETECTOMY LUMBAR FOUR- FIVE;  Surgeon: Van Gelinas, MD;  Location: [facility] OR;  Service: Neurosurgery;  Laterality: N/A;   POLYPECTOMY  12/22/2021   Procedure: POLYPECTOMY;  Surgeon: Umberto Ganong, Bearl Limes, MD;  [facility]: [facility];  Service: Gastroenterology;;   TONSILLECTOMY     removed as a child   Patient Active Problem List   Diagnosis Date Noted   Neoplasm of uncertain behavior of skin of ear 04/21/2023   Left swimmer's ear 04/07/2023   Urinary hesitancy 01/22/2023   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 01/07/2023   Left ear pain 01/04/2023   Chronic mastoiditis of left side 01/04/2023   Acute suppurative otitis media of left ear with spontaneous rupture of tympanic membrane 12/06/2022   Bilateral diabetic foot ulcer associated with secondary diabetes mellitus (HCC) 11/29/2022   Acute hip pain, left 10/19/2022   Hepatic steatosis 10/19/2022   Mass of joint of right hand 10/19/2022   Left acute otitis media 10/19/2022   Pain due to onychomycosis of toenails of both feet 07/31/2022   Mass of right hand 07/19/2022   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   Seasonal allergies 04/13/2022  Type 2 diabetes mellitus without complications (HCC) 03/06/2022   Hypokalemia 03/06/2022   Low vitamin B12 level 03/06/2022   Barrett's esophagus with high grade dysplasia 01/25/2022   Left hand pain 01/25/2022   Positive screening for depression on 9-item Patient Health Questionnaire (PHQ-9) 12/28/2021   Change in bowel habits 12/22/2021   Dysphagia 12/15/2021   Diastasis recti 12/13/2021   Abdominal distention 12/13/2021   Hernia, abdominal 11/29/2021   Bilateral lower  extremity edema 11/16/2021   Right shoulder pain 11/16/2021   Hoarseness 11/08/2021   Vitamin D  deficiency 10/06/2021   Obesity (BMI 30-39.9) 10/06/2021   Right flank pain 10/06/2021   Nasal sinus congestion 10/06/2021   Chronic musculoskeletal pain 09/26/2021   RLS (restless legs syndrome) 09/26/2021   Preventative health care 09/26/2021   DOE (dyspnea on exertion) 09/13/2021   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) 07/27/2021   Cigarette smoker 07/27/2021   S/P laminectomy 04/21/2021   Lumbar stenosis 04/21/2021   Dyslipidemia 07/22/2020   Prediabetes 07/22/2020   Focal neurological deficit 07/21/2020   COPD  GOLD 3 07/21/2020   GERD (gastroesophageal reflux disease) 07/21/2020   Essential hypertension 07/21/2020   Long-term current use of opiate analgesic 06/01/2020   Degeneration of lumbar intervertebral disc 04/25/2020   Edema of lower extremity 11/20/2019   Abnormality of gait due to impairment of balance 10/21/2019   Pain in joint of right hip 08/20/2019   Insomnia 05/01/2019   Fatigue 03/06/2019   PCP: Dr. Saint Cranker  REFERRING PROVIDER: Dr. Saint Cranker  ONSET DATE: 04/12/23  REFERRING DIAG: U98.119 (ICD-10-CM) - Other specified postprocedural states  -revision right carpal tunnel release -Neuroplasty right median nerve in the proximal forearm with lengthening of the pronator teres  -Mass excision right palm  THERAPY DIAG:  Pain in right wrist  Other symptoms and signs involving the musculoskeletal system  Pain in right elbow  Rationale for Evaluation and Treatment: Rehabilitation  SUBJECTIVE:   SUBJECTIVE STATEMENT: S: "It's sore to the touch on the wrist."   PERTINENT HISTORY: Pt is a 58 y/o male s/p right carpal tunnel revision, mass excision of right palm, and neuroplasty right median nerve in proximal forearm with lengthening of pronator teres. Pt reports high pain, wears wrist splint most of the day.   PRECAUTIONS:  Other: [place]  Handbook surgical management of carpal tunnel release  WEIGHT BEARING RESTRICTIONS: Yes NWB  PAIN:  Are you having pain? Yes: NPRS scale: 9/10 Pain location: wrist Pain description: sore, sharp Aggravating factors: movement, exercises Relieving factors: nothing  FALLS: Has patient fallen in last 6 months? Yes. Number of falls 5  PLOF: Independent  PATIENT GOALS: To be able to use the left hand and arm.  NEXT MD VISIT: 05/18/23 (with surgeon)  OBJECTIVE:  Note: Objective measures were completed at Evaluation unless otherwise noted.  HAND DOMINANCE: Right  ADLs:   FUNCTIONAL OUTCOME MEASURES: Quick Dash: 65.91  UPPER EXTREMITY ROM:     Active ROM Right eval  Wrist flexion 50  Wrist extension 52  Wrist ulnar deviation 28  Wrist radial deviation 20  Wrist pronation 90  Wrist supination 90  (Blank rows = not tested)   UPPER EXTREMITY MMT:     MMT Right eval  Elbow flexion 5/5  Elbow extension 5/5  Wrist flexion 5/5  Wrist extension 5/5  Wrist ulnar deviation 5/5  Wrist radial deviation 5/5  Wrist pronation 4/5  Wrist supination 5/5  (Blank rows = not tested)  HAND FUNCTION: Grip strength:  Right: 46 lbs; Left: 82 lbs, Lateral pinch: Right: 9 lbs, Left: 20 lbs, and 3 point pinch: Right: 12 lbs, Left: 20 lbs  OBSERVATIONS: Clean incision area with skin peeling at heel of hand.    TREATMENT DATE:  05/09/23 -Manual techniques: retrograde massage and myofascial release to right wrist and volar forearm to decrease pain and fascial/scar restrictions and increase joint ROM -Wrist A/ROM: flexion, extension, ulnar and radial deviation, pronation/supination, 10 reps -Digit ROM:   -composite flexion and extension, 10 reps  -Opposition, 10 reps  -finger taps, 10 reps  -digit abduction/adduction, 10 reps  -Tendon glides, 10 reps -Sponges: 11, 12, 15 -Towel crumple: 10 reps -Sponge activity: pt with forearm on the table, grasping high resistance sponge  and using wrist extension to stack. Pt stacking 3 towers of 4 sponges. To remove, pt picking up one sponge and flexing wrist inward towards him as far as possible and dropping sponge on the table.   Eval:  -Manual techniques: retrograde massage and myofascial release to right wrist and volar forearm to decrease pain and fascial/scar restrictions and increase joint ROM                                                                                                                              PATIENT EDUCATION: Education details: reviewed HEP Person educated: Patient Education method: Explanation, Demonstration, and Handouts Education comprehension: verbalized understanding and returned demonstration  HOME EXERCISE PROGRAM: Eval: Wrist A/ROM, finger A/ROM  GOALS: Goals reviewed with patient? Yes  SHORT TERM GOALS: Target date: 05/24/23  Pt will be provided with and educated on HEP to improve mobility of RUE required for ADL completion.   Goal status: IN PROGRESS   LONG TERM GOALS: Target date: 06/15/23  Pt will decrease pain in RUE to 5/10 or less to improve ability to grip items and use RUE as dominant during ADLs.    Goal status: IN PROGRESS  2.  Pt will increase grip strength of RUE by 20# and pinch strength by 5# to improve ability to grip and cast a fishing rod.   Goal status: IN PROGRESS  3.  Pt will decrease fascial restrictions in RUE to min amounts or less to improve mobility required for tasks including reaching behind back for washing and mobility required for cutting food.   Goal status: IN PROGRESS  4.  Pt will be educated on AE available to assist with ADLs and reduce stress to joints.   Goal status: IN PROGRESS     ASSESSMENT:  CLINICAL IMPRESSION: Pt reports he has been completing his HEP, notes a small bump at the lateral aspect of his incision site at the wrist. On palpation OT notes cyst-like feeling, pt reports tenderness. Continued with manual  techniques and reviewed A/ROM HEP for both wrist and finger. Pt has good A/ROM in all planes, however reports sharp pains during exercises and throughout the day that run from the wrist up to the  shoulder. Recommended calling MD to report the pain he is having and see if they want to see him earlier than May. Completed gentle grip exercises with sponges and washcloth, pt modifying grip force based on pain. Verbal cuing for form and technique.   PERFORMANCE DEFICITS: in functional skills including ADLs, IADLs, coordination, dexterity, edema, ROM, strength, pain, fascial restrictions, Fine motor control, and UE functional use      PLAN:  OT FREQUENCY: 1x/week  OT DURATION: 6 weeks  PLANNED INTERVENTIONS: 97168 OT Re-evaluation, 97535 self care/ADL training, 16109 therapeutic exercise, 97530 therapeutic activity, 97112 neuromuscular re-education, 97140 manual therapy, 97035 ultrasound, 97018 paraffin, 60454 moist heat, 97014 electrical stimulation unattended, patient/family education, and DME and/or AE instructions  CONSULTED AND AGREED WITH PLAN OF CARE: Patient  PLAN FOR NEXT SESSION: Follow up on HEP, manual techniques, A/ROM, gentle grip and pinch strengthening   Lafonda Piety, OTR/L  [phone] 05/09/2023, 2:27 PM

## 2023-05-09 NOTE — Telephone Encounter (Signed)
 Refills sent to pharmacy.

## 2023-05-10 ENCOUNTER — Encounter (INDEPENDENT_AMBULATORY_CARE_PROVIDER_SITE_OTHER): Payer: Self-pay

## 2023-05-10 ENCOUNTER — Ambulatory Visit (INDEPENDENT_AMBULATORY_CARE_PROVIDER_SITE_OTHER): Admitting: Physician Assistant

## 2023-05-10 VITALS — BP 121/80 | HR 66 | Ht 64.0 in | Wt 189.0 lb

## 2023-05-10 DIAGNOSIS — C44229 Squamous cell carcinoma of skin of left ear and external auricular canal: Secondary | ICD-10-CM

## 2023-05-10 NOTE — Progress Notes (Signed)
 Dear Dr. Kermit Ped, Here is my assessment for our mutual patient, Jerry Aguirre. Thank you for allowing me the opportunity to care for your patient. Please do not hesitate to contact me should you have any other questions. Sincerely, Belma Boxer PA-C  Otolaryngology Clinic Note Referring provider: Dr. Kermit Ped HPI:  Jerry Aguirre is a 58 y.o. male kindly referred by Dr. Kermit Ped   The patient is a 58 year old male seen in our office for follow-up evaluation status post removal of abnormal skin lesion of his left ear by Dr. Darlin Ehrlich on 04/26/2023.  The patient notes since excision he has no complaints, no redness swelling discharge pain.  He denies any other issues today.  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   Independent Review of Additional Tests or Records:    A. SOFT TISSUE MASS, LEFT AURICULAR, EXCISION:  -  Squamous cell carcinoma, well-differentiated (keratoacanthoma-like),  1.0 cm in greatest dimension, invasive into reticular dermis.  -  All margins negative for malignancy.  -  Severe solar elastosis.     PMH/Meds/All/SocHx/FamHx/ROS:   Past Medical History:  Diagnosis Date   COPD (chronic obstructive pulmonary disease) (HCC)    Dyspnea    GERD (gastroesophageal reflux disease)    Headache    Hyperlipidemia    Hypertension    Sleep apnea    pt says his "OSA was mild and they said he did not need a CPAP"     Past Surgical History:  Procedure Laterality Date   BIOPSY  12/22/2021   Procedure: BIOPSY;  Surgeon: Urban Garden, MD;  [facility];  Service: Gastroenterology;;   COLONOSCOPY WITH PROPOFOL  N/A 12/22/2021   Procedure: COLONOSCOPY WITH PROPOFOL ;  Surgeon: Urban Garden, MD;  [facility];  Service: Gastroenterology;  Laterality: N/A;  10:15am, asa 3   ESOPHAGOGASTRODUODENOSCOPY (EGD) WITH PROPOFOL  N/A 12/22/2021   Procedure: ESOPHAGOGASTRODUODENOSCOPY (EGD) WITH PROPOFOL ;  Surgeon:  Urban Garden, MD;  [facility];  Service: Gastroenterology;  Laterality: N/A;   HERNIA REPAIR Right    inguinal   LUMBAR LAMINECTOMY/DECOMPRESSION MICRODISCECTOMY N/A 04/21/2021   Procedure: LAMINECTOMY, MEDIAL FACETECTOMY LUMBAR FOUR- FIVE;  Surgeon: Van Gelinas, MD;  Location: [facility] OR;  Service: Neurosurgery;  Laterality: N/A;   POLYPECTOMY  12/22/2021   Procedure: POLYPECTOMY;  Surgeon: Urban Garden, MD;  [facility];  Service: Gastroenterology;;   TONSILLECTOMY     removed as a child    Family History  Problem Relation Age of Onset   Esophageal cancer Mother    Lung cancer Father      Social Connections: Socially Isolated (01/21/2023)   Social Connection and Isolation Panel [NHANES]    Frequency of Communication with Friends and Family: More than three times a week    Frequency of Social Gatherings with Friends and Family: More than three times a week    Attends Religious Services: Never    Database administrator or Organizations: No    Attends Engineer, structural: Not on file    Marital Status: Divorced      Current Outpatient Medications:    ACCU-CHEK GUIDE TEST test strip, TEST EVERY DAY, Disp: 100 strip, Rfl: 12   Accu-Chek Softclix Lancets lancets, USE AS DIRECTED TO CHECK BLOOD SUGAR IN THE MORNING, AT NOON, AND AT BEDTIME., Disp: 100 each, Rfl: 0   albuterol  (VENTOLIN  HFA) 108 (90 Base) MCG/ACT inhaler, Inhale 2 puffs into the lungs every 4 (four) hours as  needed for wheezing or shortness of breath., Disp: 1 each, Rfl: 11   atorvastatin  (LIPITOR) 80 MG tablet, Take 1 tablet (80 mg total) by mouth daily., Disp: 90 tablet, Rfl: 3   azithromycin  (ZITHROMAX ) 250 MG tablet, Take 2 on day one then 1 daily x 4 days, Disp: 6 tablet, Rfl: 0   baclofen  (LIORESAL ) 10 MG tablet, Take 1 tablet (10 mg total) by mouth 2 (two) times daily as needed., Disp: 60 tablet, Rfl: 0   Blood Glucose Monitoring Suppl DEVI, 1 each by Does  not apply route in the morning, at noon, and at bedtime. May substitute to any manufacturer covered by patient's insurance., Disp: 1 each, Rfl: 0   budesonide -formoterol  (SYMBICORT ) 80-4.5 MCG/ACT inhaler, Inhale 2 puffs into the lungs 2 (two) times daily., Disp: 1 each, Rfl: 3   cetirizine  (ZYRTEC ) 10 MG tablet, Take 1 tablet (10 mg total) by mouth daily., Disp: 30 tablet, Rfl: 0   Chlorphen-Phenyleph-ASA (ALKA-SELTZER PLUS COLD PO), Take 2 tablets by mouth at bedtime., Disp: , Rfl:    cyclobenzaprine  (FLEXERIL ) 10 MG tablet, Take 1 tablet (10 mg total) by mouth 3 (three) times daily as needed for muscle spasms., Disp: 20 tablet, Rfl: 0   Elastic Bandages & Supports (MEDICAL COMPRESSION STOCKINGS) MISC, 2 each by Does not apply route daily., Disp: 2 each, Rfl: 0   ezetimibe  (ZETIA ) 10 MG tablet, Take 1 tablet (10 mg total) by mouth daily., Disp: 90 tablet, Rfl: 3   furosemide  (LASIX ) 40 MG tablet, TAKE (1) TABLET BY MOUTH THREE TIMES DAILY AS NEEDED FOR FLUID OR EDEMA, Disp: 90 tablet, Rfl: 2   lactulose  (CHRONULAC ) 10 GM/15ML solution, Take 10 g by mouth at bedtime., Disp: , Rfl:    meloxicam  (MOBIC ) 7.5 MG tablet, Take 1 tablet (7.5 mg total) by mouth 2 (two) times daily., Disp: 60 tablet, Rfl: 3   Menthol -Zinc Oxide (GOLD BOND EX), Apply 1 Application topically daily as needed (dry skin)., Disp: , Rfl:    metFORMIN  (GLUCOPHAGE -XR) 500 MG 24 hr tablet, Take 1 tablet (500 mg total) by mouth daily with breakfast., Disp: 30 tablet, Rfl: 0   metoprolol  succinate (TOPROL -XL) 25 MG 24 hr tablet, Take 1 tablet (25 mg total) by mouth daily., Disp: 30 tablet, Rfl: 0   ondansetron  (ZOFRAN ) 4 MG tablet, Take 1 tablet (4 mg total) by mouth 2 (two) times daily., Disp: 20 tablet, Rfl: 0   oxyCODONE -acetaminophen  (PERCOCET) 10-325 MG tablet, TAKE 1 TABLET EVERY 4-6 HOURS. MAX 5 PER DAY FOR 30 DAYS, Disp: 120 tablet, Rfl: 0   OXYCONTIN  10 MG 12 hr tablet, Take 10 mg by mouth at bedtime., Disp: , Rfl:     pantoprazole  (PROTONIX ) 40 MG tablet, Take 1 tablet (40 mg total) by mouth 2 (two) times daily., Disp: 60 tablet, Rfl: 2   pramipexole  (MIRAPEX ) 0.75 MG tablet, Take 1-2 tablets (0.75-1.5 mg total) by mouth See admin instructions. Take 0.75 mg in the morning and 1.5 mg at nightTake 0.75-1.5 mg by mouth See admin instructions. Take 0.75 mg in the morning and 1.5 mg at night, Disp: 90 tablet, Rfl: 2   pregabalin  (LYRICA ) 150 MG capsule, Take 1 capsule (150 mg total) by mouth daily as needed., Disp: 30 capsule, Rfl: 0   Pseudoeph-Doxylamine-DM-APAP (NYQUIL PO), Take 30 mLs by mouth at bedtime., Disp: , Rfl:    Semaglutide , 1 MG/DOSE, (OZEMPIC , 1 MG/DOSE,) 4 MG/3ML SOPN, Inject 1 mg into the skin once a week., Disp: 3 mL, Rfl: 2  Simethicone  125 MG CAPS, Take 1 capsule (125 mg total) by mouth every 6 (six) hours as needed (bloating)., Disp: 180 capsule, Rfl: 1   tamsulosin  (FLOMAX ) 0.4 MG CAPS capsule, Take 1 capsule (0.4 mg total) by mouth daily., Disp: 30 capsule, Rfl: 3   triamterene -hydrochlorothiazide  (DYAZIDE ) 37.5-25 MG capsule, Take 1 each (1 capsule total) by mouth daily., Disp: 90 capsule, Rfl: 0   Vitamin D , Ergocalciferol , (DRISDOL ) 1.25 MG (50000 UNIT) CAPS capsule, Take 1 capsule (50,000 Units total) by mouth every 7 (seven) days., Disp: 12 capsule, Rfl: 0   XTAMPZA  ER 9 MG C12A, Take 1 capsule by mouth daily., Disp: , Rfl:    zolpidem (AMBIEN) 10 MG tablet, Take 10 mg by mouth at bedtime., Disp: , Rfl:    azelastine  (ASTELIN ) 0.1 % nasal spray, Place 1 spray into both nostrils 2 (two) times daily. Use in each nostril as directed, Disp: 30 mL, Rfl: 5   Physical Exam:   BP 121/80 (BP Location: Left Arm, Cuff Size: Normal)   Pulse 66   Ht 5\' 4"  (1.626 m)   Wt 189 lb (85.7 kg)   SpO2 90%   BMI 32.44 kg/m   Pertinent Findings  CN II-XII intact Lateral left pinna with 3 Prolene stitches in place, wound well-approximated, clean dry and intact no surrounding redness, remainder of pinna  normal  Seprately Identifiable Procedures:  None  Impression & Plans:  Jerry Aguirre is a 58 y.o. male with the following   Squamous cell carcinoma-  Patient here for suture removal of abnormal skin lesion excision.  3 Prolene stitches removed without difficulty, incision clean dry and intact with no signs of infection or complications.  Pathology results reviewed showing well-differentiated squamous cell carcinoma with clear margins. Dr. Darlin Ehrlich will provide further recommendations based on pathology results.   - f/u as needed   Thank you for allowing me the opportunity to care for your patient. Please do not hesitate to contact me should you have any other questions.  Sincerely, Belma Boxer PA-C [facility] Phone: [phone] Fax: [phone]  05/10/2023, 9:25 AM

## 2023-05-14 ENCOUNTER — Ambulatory Visit (INDEPENDENT_AMBULATORY_CARE_PROVIDER_SITE_OTHER)

## 2023-05-14 VITALS — BP 131/80 | HR 68 | Ht 64.0 in | Wt 196.1 lb

## 2023-05-14 DIAGNOSIS — E1159 Type 2 diabetes mellitus with other circulatory complications: Secondary | ICD-10-CM | POA: Diagnosis not present

## 2023-05-14 DIAGNOSIS — R3911 Hesitancy of micturition: Secondary | ICD-10-CM

## 2023-05-14 DIAGNOSIS — H539 Unspecified visual disturbance: Secondary | ICD-10-CM | POA: Diagnosis not present

## 2023-05-14 DIAGNOSIS — Z7984 Long term (current) use of oral hypoglycemic drugs: Secondary | ICD-10-CM

## 2023-05-14 DIAGNOSIS — E538 Deficiency of other specified B group vitamins: Secondary | ICD-10-CM

## 2023-05-14 DIAGNOSIS — M48061 Spinal stenosis, lumbar region without neurogenic claudication: Secondary | ICD-10-CM | POA: Diagnosis not present

## 2023-05-14 DIAGNOSIS — G2581 Restless legs syndrome: Secondary | ICD-10-CM

## 2023-05-14 DIAGNOSIS — K219 Gastro-esophageal reflux disease without esophagitis: Secondary | ICD-10-CM

## 2023-05-14 DIAGNOSIS — I10 Essential (primary) hypertension: Secondary | ICD-10-CM

## 2023-05-14 DIAGNOSIS — E119 Type 2 diabetes mellitus without complications: Secondary | ICD-10-CM

## 2023-05-14 MED ORDER — TAMSULOSIN HCL 0.4 MG PO CAPS: 0.4000 mg | ORAL_CAPSULE | Freq: Every day | ORAL | 5 refills | Status: DC

## 2023-05-14 MED ORDER — BACLOFEN 10 MG PO TABS: 10.0000 mg | ORAL_TABLET | Freq: Two times a day (BID) | ORAL | 5 refills | Status: DC | PRN

## 2023-05-14 MED ORDER — PANTOPRAZOLE SODIUM 40 MG PO TBEC: 40.0000 mg | DELAYED_RELEASE_TABLET | Freq: Two times a day (BID) | ORAL | 5 refills | Status: DC

## 2023-05-14 MED ORDER — PRAMIPEXOLE DIHYDROCHLORIDE 0.75 MG PO TABS: 0.7500 mg | ORAL_TABLET | ORAL | 5 refills | Status: AC

## 2023-05-14 MED ORDER — CYANOCOBALAMIN 1000 MCG/ML IJ SOLN
1000.0000 ug | Freq: Once | INTRAMUSCULAR | Status: AC
  Administered 2023-05-14: 1000 ug via INTRAMUSCULAR

## 2023-05-14 MED ORDER — CETIRIZINE HCL 10 MG PO TABS: 10.0000 mg | ORAL_TABLET | Freq: Every day | ORAL | 11 refills | Status: AC

## 2023-05-14 MED ORDER — CYCLOBENZAPRINE HCL 10 MG PO TABS
10.0000 mg | ORAL_TABLET | Freq: Three times a day (TID) | ORAL | 5 refills | Status: DC | PRN
Start: 2023-05-14 — End: 2023-10-31

## 2023-05-14 MED ORDER — PREGABALIN 150 MG PO CAPS: 150.0000 mg | ORAL_CAPSULE | Freq: Every day | ORAL | 2 refills | Status: DC | PRN

## 2023-05-14 MED ORDER — METFORMIN HCL ER 500 MG PO TB24: 500.0000 mg | ORAL_TABLET | Freq: Every day | ORAL | 5 refills | Status: DC

## 2023-05-14 MED ORDER — ONDANSETRON HCL 4 MG PO TABS: 4.0000 mg | ORAL_TABLET | Freq: Two times a day (BID) | ORAL | 5 refills | Status: DC

## 2023-05-14 MED ORDER — METOPROLOL SUCCINATE ER 25 MG PO TB24: 25.0000 mg | ORAL_TABLET | Freq: Every day | ORAL | 5 refills | Status: DC

## 2023-05-14 MED ORDER — TRIAMTERENE-HCTZ 37.5-25 MG PO CAPS: 1.0000 | ORAL_CAPSULE | Freq: Every day | ORAL | 5 refills | Status: DC

## 2023-05-14 NOTE — Progress Notes (Signed)
 Established Patient Office Visit  Subjective   Patient ID: Jerry Aguirre, male    DOB: [birth year]  Age: 58 y.o. MRN: [id number]  Chief Complaint  Patient presents with   Medical Management of Chronic Issues    Pt here for a "MRI order that he was requested to get by his Ophthalmologist"    HPI  Patient states that he recently saw his ophthalmologist for loss of vision that is transient in nature.  His ophthalmologist does not feel that his vision loss is ophthalmologic in nature.  He recommends an MRI for further evaluation patient denies any recent head trauma, loss of consciousness, headaches or dizziness   Past Medical History:  Diagnosis Date   COPD (chronic obstructive pulmonary disease) (HCC)    Dyspnea    GERD (gastroesophageal reflux disease)    Headache    Hyperlipidemia    Hypertension    Sleep apnea    pt says his "OSA was mild and they said he did not need a CPAP"   Past Surgical History:  Procedure Laterality Date   BIOPSY  12/22/2021   Procedure: BIOPSY;  Surgeon: Urban Garden, MD;  Location: [facility];  Service: Gastroenterology;;   COLONOSCOPY WITH PROPOFOL  N/A 12/22/2021   Procedure: COLONOSCOPY WITH PROPOFOL ;  Surgeon: Urban Garden, MD;  Location: [facility];  Service: Gastroenterology;  Laterality: N/A;  10:15am, asa 3   ESOPHAGOGASTRODUODENOSCOPY (EGD) WITH PROPOFOL  N/A 12/22/2021   Procedure: ESOPHAGOGASTRODUODENOSCOPY (EGD) WITH PROPOFOL ;  Surgeon: Urban Garden, MD;  Location: [facility];  Service: Gastroenterology;  Laterality: N/A;   HERNIA REPAIR Right    inguinal   LUMBAR LAMINECTOMY/DECOMPRESSION MICRODISCECTOMY N/A 04/21/2021   Procedure: LAMINECTOMY, MEDIAL FACETECTOMY LUMBAR FOUR- FIVE;  Surgeon: Van Gelinas, MD;  Location: [facility] OR;  Service: Neurosurgery;  Laterality: N/A;   POLYPECTOMY  12/22/2021   Procedure: POLYPECTOMY;  Surgeon: Umberto Ganong, Bearl Limes, MD;  Location: [facility];   Service: Gastroenterology;;   TONSILLECTOMY     removed as a child      ROS    Objective:     BP 131/80   Pulse 68   Ht 5\' 4"  (1.626 m)   Wt 196 lb 1.9 oz (89 kg)   SpO2 92%   BMI 33.66 kg/m    Physical Exam Vitals and nursing note reviewed.  Constitutional:      Appearance: Normal appearance. He is obese.  Eyes:     Extraocular Movements: Extraocular movements intact.     Pupils: Pupils are equal, round, and reactive to light.  Neurological:     Mental Status: He is alert and oriented to person, place, and time.     Gait: Gait abnormal (antalgic in nature, uses a cane).  Psychiatric:        Mood and Affect: Mood normal.        Thought Content: Thought content normal.      No results found for any visits on 05/14/23.    The ASCVD Risk score (Arnett DK, et al., 2019) failed to calculate for the following reasons:   Risk score cannot be calculated because patient has a medical history suggesting prior/existing ASCVD    Assessment & Plan:   Problem List Items Addressed This Visit       Cardiovascular and Mediastinum   Essential hypertension   Remains adequately controlled on current antihypertensive regimen. No medication changes are indicated today.       Relevant Medications   metoprolol  succinate (  TOPROL -XL) 25 MG 24 hr tablet   triamterene -hydrochlorothiazide  (DYAZIDE ) 37.5-25 MG capsule     Digestive   GERD (gastroesophageal reflux disease)   Symptoms controlled with Protonix  40 mg twice daily        Relevant Medications   ondansetron  (ZOFRAN ) 4 MG tablet   pantoprazole  (PROTONIX ) 40 MG tablet     Endocrine   Type 2 diabetes mellitus without complications (HCC) - Primary   He recently had labs done a few months ago but A1c was not included.  Will check that today.  Will adjust medications if indicated.      Relevant Medications   metFORMIN  (GLUCOPHAGE -XR) 500 MG 24 hr tablet   Other Relevant Orders   Bayer DCA Hb A1c Waived     Other    Lumbar stenosis   Relevant Medications   baclofen  (LIORESAL ) 10 MG tablet   cyclobenzaprine  (FLEXERIL ) 10 MG tablet   pregabalin  (LYRICA ) 150 MG capsule   RLS (restless legs syndrome)   Symptoms controlled with pramipexole  B12 shot administered in office today based on most recent results.       Relevant Medications   pramipexole  (MIRAPEX ) 0.75 MG tablet   Urinary hesitancy   Symptoms are well-controlled with Flomax .  Continue with current dose.      Relevant Medications   tamsulosin  (FLOMAX ) 0.4 MG CAPS capsule   B12 deficiency   Recent labs showed vitamin B-12 deficiency.  Will administer B12 in office today.  Also recommend daily over-the-counter B12 supplement      Vision changes   Will obtain MRI of brain for further evaluation of vision loss.  He recently saw his ophthalmologist, who did not feel that vision loss was ophthalmologic in nature.      Relevant Orders   MR BRAIN W CONTRAST    No follow-ups on file.    Alison Irvine, FNP

## 2023-05-14 NOTE — Assessment & Plan Note (Signed)
 Will obtain MRI of brain for further evaluation of vision loss.  He recently saw his ophthalmologist, who did not feel that vision loss was ophthalmologic in nature.

## 2023-05-14 NOTE — Assessment & Plan Note (Signed)
 Remains adequately controlled on current antihypertensive regimen.  No medication changes are indicated today.

## 2023-05-14 NOTE — Assessment & Plan Note (Signed)
 Symptoms are well-controlled with Flomax .  Continue with current dose.

## 2023-05-14 NOTE — Assessment & Plan Note (Signed)
 He recently had labs done a few months ago but A1c was not included.  Will check that today.  Will adjust medications if indicated.

## 2023-05-14 NOTE — Assessment & Plan Note (Signed)
 Symptoms controlled with pramipexole  B12 shot administered in office today based on most recent results.

## 2023-05-14 NOTE — Patient Instructions (Signed)
 B12 injection administered in office.  Recommend repeating this in one month.  Can do as a nurse visit.   Recommend f/u in 6 months or according to results of A1C

## 2023-05-14 NOTE — Assessment & Plan Note (Signed)
Symptoms controlled with Protonix 40 mg twice daily 

## 2023-05-14 NOTE — Assessment & Plan Note (Signed)
 Recent labs showed vitamin B-12 deficiency.  Will administer B12 in office today.  Also recommend daily over-the-counter B12 supplement

## 2023-05-15 ENCOUNTER — Other Ambulatory Visit: Payer: Self-pay | Admitting: Internal Medicine

## 2023-05-15 DIAGNOSIS — R600 Localized edema: Secondary | ICD-10-CM

## 2023-05-15 LAB — BAYER DCA HB A1C WAIVED: HB A1C (BAYER DCA - WAIVED): 6.1 % — ABNORMAL HIGH (ref 4.8–5.6)

## 2023-05-17 ENCOUNTER — Encounter (HOSPITAL_COMMUNITY): Admitting: Occupational Therapy

## 2023-05-18 DIAGNOSIS — G5602 Carpal tunnel syndrome, left upper limb: Secondary | ICD-10-CM | POA: Insufficient documentation

## 2023-05-21 ENCOUNTER — Ambulatory Visit: Admitting: Podiatry

## 2023-05-24 ENCOUNTER — Encounter (HOSPITAL_COMMUNITY): Admitting: Occupational Therapy

## 2023-05-30 ENCOUNTER — Ambulatory Visit (INDEPENDENT_AMBULATORY_CARE_PROVIDER_SITE_OTHER): Admitting: Podiatry

## 2023-05-30 ENCOUNTER — Encounter: Payer: Self-pay | Admitting: Podiatry

## 2023-05-30 DIAGNOSIS — M79671 Pain in right foot: Secondary | ICD-10-CM

## 2023-05-30 DIAGNOSIS — B353 Tinea pedis: Secondary | ICD-10-CM

## 2023-05-30 DIAGNOSIS — L209 Atopic dermatitis, unspecified: Secondary | ICD-10-CM

## 2023-05-30 DIAGNOSIS — B351 Tinea unguium: Secondary | ICD-10-CM | POA: Diagnosis not present

## 2023-05-30 MED ORDER — TRIAMCINOLONE ACETONIDE 0.5 % EX CREA: TOPICAL_CREAM | Freq: Two times a day (BID) | CUTANEOUS | Status: AC

## 2023-05-30 NOTE — Addendum Note (Signed)
 Addended by: Reche Canales on: 05/30/2023 04:23 PM   Modules accepted: Orders

## 2023-05-30 NOTE — Progress Notes (Signed)
   Subjective:    HPI Presents for follow-up onychomycosis treatment with p.o. Lamisil.  No problems taking medicine with no side effects noted.  Pain around nails when wearing shoes.  Complain of tender rash plantar aspect of the foot mid arch with blistering and severe itching   Objective:  Physical Exam   General: AAO x3, NAD  Vascular: DP and PT pulses palpable bilaterally.  Immedate capillary fill time digits. No significant lower extremity edema bilaterally.  Dermatological: Onychomycotic mycotic changes nails 1 through 5 with discoloration nail and subungual debris and thickening of the nail. 0% Clearance of onychomycotic nail changes noted. Tenderness with walking and wearing shoes.  Erythematous scaly rash with small bulla plantar arches feet bilaterally  Neruologic: Grossly intact B/L  Musculoskeletal:   Assessment:  Painful onychomycotic nails 1 through 5 bilaterally. 2.  Pain feet bilaterally 3. tinea pedis with possible component of atopic dermatitis feet bilaterally    Plan:  -Established office visit level 3 evaluation and management -Discussed rash on feet.  Probably tinea pedis but could be a component of atopic dermatitis to this. -Patient is tolerating Lamisil treatment for onychomycotic nails well.  No side effects noted. Will continue this treatment. -Rx: Lamisil 250 mg p.o. daily -Rx: Triamcinolone cream 0.5%, apply twice daily to affected area feet.  3 refills -Labs ordered for liver function tests to monitor for any hepatic side effects from the Lamisil.  - Return in 4 weeks for 3rd Lamisil.

## 2023-05-31 ENCOUNTER — Observation Stay (HOSPITAL_COMMUNITY)
Admission: EM | Admit: 2023-05-31 | Discharge: 2023-06-02 | Disposition: A | Discharge status: Home / Self Care | Attending: Family Medicine | Admitting: Family Medicine

## 2023-05-31 ENCOUNTER — Encounter (HOSPITAL_COMMUNITY): Admitting: Occupational Therapy

## 2023-05-31 ENCOUNTER — Other Ambulatory Visit: Payer: Self-pay

## 2023-05-31 ENCOUNTER — Encounter (HOSPITAL_COMMUNITY): Payer: Self-pay

## 2023-05-31 DIAGNOSIS — Z794 Long term (current) use of insulin: Secondary | ICD-10-CM | POA: Insufficient documentation

## 2023-05-31 DIAGNOSIS — J9611 Chronic respiratory failure with hypoxia: Secondary | ICD-10-CM | POA: Diagnosis not present

## 2023-05-31 DIAGNOSIS — R0609 Other forms of dyspnea: Secondary | ICD-10-CM

## 2023-05-31 DIAGNOSIS — R29810 Facial weakness: Secondary | ICD-10-CM | POA: Diagnosis present

## 2023-05-31 DIAGNOSIS — J449 Chronic obstructive pulmonary disease, unspecified: Secondary | ICD-10-CM | POA: Diagnosis present

## 2023-05-31 DIAGNOSIS — E119 Type 2 diabetes mellitus without complications: Secondary | ICD-10-CM | POA: Diagnosis not present

## 2023-05-31 DIAGNOSIS — G2581 Restless legs syndrome: Secondary | ICD-10-CM | POA: Insufficient documentation

## 2023-05-31 DIAGNOSIS — F1490 Cocaine use, unspecified, uncomplicated: Secondary | ICD-10-CM | POA: Insufficient documentation

## 2023-05-31 DIAGNOSIS — N400 Enlarged prostate without lower urinary tract symptoms: Secondary | ICD-10-CM | POA: Diagnosis not present

## 2023-05-31 DIAGNOSIS — I700 Atherosclerosis of aorta: Secondary | ICD-10-CM | POA: Diagnosis not present

## 2023-05-31 DIAGNOSIS — R600 Localized edema: Secondary | ICD-10-CM

## 2023-05-31 DIAGNOSIS — Z7982 Long term (current) use of aspirin: Secondary | ICD-10-CM | POA: Diagnosis not present

## 2023-05-31 DIAGNOSIS — I639 Cerebral infarction, unspecified: Secondary | ICD-10-CM | POA: Diagnosis present

## 2023-05-31 DIAGNOSIS — I10 Essential (primary) hypertension: Secondary | ICD-10-CM | POA: Insufficient documentation

## 2023-05-31 DIAGNOSIS — K219 Gastro-esophageal reflux disease without esophagitis: Secondary | ICD-10-CM | POA: Diagnosis not present

## 2023-05-31 DIAGNOSIS — R202 Paresthesia of skin: Secondary | ICD-10-CM | POA: Diagnosis not present

## 2023-05-31 MED ORDER — SODIUM CHLORIDE 0.9% FLUSH
3.0000 mL | Freq: Once | INTRAVENOUS | Status: AC
  Administered 2023-06-01: 3 mL via INTRAVENOUS

## 2023-05-31 NOTE — ED Triage Notes (Addendum)
 Pt arrived from home via POV c/o left sided facial droop. LKW 05/30/2023 0130. NIH 2 in triage. Pt noticed the facial droop when he fell out of bed approx 0830 on 05/30/2023

## 2023-05-31 NOTE — ED Provider Notes (Signed)
 [place] EMERGENCY DEPARTMENT AT [facility] Provider Note   CSN: [id number] Arrival date & time: 05/31/23  2247     History  Chief Complaint  Patient presents with   Facial Droop    Ayham Word is a 58 y.o. male.  58 yo M here with sister secondary to left sided facial droop, stuttering, left arm and leg parethesias. Says that he went to bed at unknown time, fell around 0130 5/15 and attributed it to his swollen legs. He noticed he had a facial droop at that time but wanted to see if it went away on it's own. Later in the day his sister stated that he kept stuttering and having trouble speaking/getting words out but, again, wanted to wait and see if it got better. The speaking seems to have improved but the other symptoms havent so they are here for evaluation. No h/o stroke. Quit smoking 5 days ago. H/o DM, HTN, HLD, COPD, allergies, indigestion.         Home Medications Prior to Admission medications   Medication Sig Start Date End Date Taking? Authorizing Provider  ACCU-CHEK GUIDE TEST test strip TEST EVERY DAY 05/03/23   Dixon, Phillip E, MD  Accu-Chek Softclix Lancets lancets USE AS DIRECTED TO CHECK BLOOD SUGAR IN THE MORNING, AT NOON, AND AT BEDTIME. 04/27/22   Tobi Fortes, MD  albuterol  (VENTOLIN  HFA) 108 (90 Base) MCG/ACT inhaler Inhale 2 puffs into the lungs every 4 (four) hours as needed for wheezing or shortness of breath. 01/22/23   Tobi Fortes, MD  atorvastatin  (LIPITOR) 80 MG tablet Take 1 tablet (80 mg total) by mouth daily. 01/23/23   Tobi Fortes, MD  baclofen  (LIORESAL ) 10 MG tablet Take 1 tablet (10 mg total) by mouth 2 (two) times daily as needed. 05/14/23   Alison Irvine, FNP  Blood Glucose Monitoring Suppl DEVI 1 each by Does not apply route in the morning, at noon, and at bedtime. May substitute to any manufacturer covered by patient's insurance. 04/27/22   Tobi Fortes, MD  cetirizine  (ZYRTEC ) 10 MG tablet Take 1 tablet (10 mg  total) by mouth daily. 05/14/23   Alison Irvine, FNP  cyclobenzaprine  (FLEXERIL ) 10 MG tablet Take 1 tablet (10 mg total) by mouth 3 (three) times daily as needed for muscle spasms. 05/14/23   Alison Irvine, FNP  Elastic Bandages & Supports (MEDICAL COMPRESSION STOCKINGS) MISC 2 each by Does not apply route daily. 11/23/21   Tobi Fortes, MD  ezetimibe  (ZETIA ) 10 MG tablet Take 1 tablet (10 mg total) by mouth daily. 10/06/21   Tobi Fortes, MD  furosemide  (LASIX ) 40 MG tablet TAKE (1) TABLET BY MOUTH THREE TIMES DAILY AS NEEDED FOR FLUID OR EDEMA 05/15/23   Tobi Fortes, MD  lactulose  (CHRONULAC ) 10 GM/15ML solution Take 10 g by mouth at bedtime. 03/21/21   [provider]  meloxicam  (MOBIC ) 7.5 MG tablet Take 1 tablet (7.5 mg total) by mouth 2 (two) times daily. 11/09/21   Tobi Fortes, MD  Menthol -Zinc Oxide (GOLD BOND EX) Apply 1 Application topically daily as needed (dry skin).    [provider]  metFORMIN  (GLUCOPHAGE -XR) 500 MG 24 hr tablet Take 1 tablet (500 mg total) by mouth daily with breakfast. 05/14/23   Alison Irvine, FNP  metoprolol  succinate (TOPROL -XL) 25 MG 24 hr tablet Take 1 tablet (25 mg total) by mouth daily. 05/14/23   Alison Irvine, FNP  ondansetron  (ZOFRAN ) 4 MG tablet Take 1  tablet (4 mg total) by mouth 2 (two) times daily. 05/14/23   Alison Irvine, FNP  oxyCODONE -acetaminophen  (PERCOCET) 10-325 MG tablet TAKE 1 TABLET EVERY 4-6 HOURS. MAX 5 PER DAY FOR 30 DAYS 06/05/22   Tobi Fortes, MD  OXYCONTIN  10 MG 12 hr tablet Take 10 mg by mouth at bedtime. 04/02/23   [provider]  pantoprazole  (PROTONIX ) 40 MG tablet Take 1 tablet (40 mg total) by mouth 2 (two) times daily. 05/14/23   Alison Irvine, FNP  pramipexole  (MIRAPEX ) 0.75 MG tablet Take 1-2 tablets (0.75-1.5 mg total) by mouth See admin instructions. Take 0.75 mg in the morning and 1.5 mg at night 05/14/23   Alison Irvine, FNP  pregabalin  (LYRICA ) 150 MG capsule Take 1 capsule (150  mg total) by mouth daily as needed. 05/14/23   Alison Irvine, FNP  Semaglutide , 1 MG/DOSE, (OZEMPIC , 1 MG/DOSE,) 4 MG/3ML SOPN Inject 1 mg into the skin once a week. 05/09/23   Tobi Fortes, MD  Simethicone  125 MG CAPS Take 1 capsule (125 mg total) by mouth every 6 (six) hours as needed (bloating). 12/22/21   Urban Garden, MD  tamsulosin  (FLOMAX ) 0.4 MG CAPS capsule Take 1 capsule (0.4 mg total) by mouth daily. 05/14/23   Alison Irvine, FNP  triamterene -hydrochlorothiazide  (DYAZIDE ) 37.5-25 MG capsule Take 1 each (1 capsule total) by mouth daily. 05/14/23   Alison Irvine, FNP  Vitamin D , Ergocalciferol , (DRISDOL ) 1.25 MG (50000 UNIT) CAPS capsule Take 1 capsule (50,000 Units total) by mouth every 7 (seven) days. 04/03/23   Tobi Fortes, MD  XTAMPZA  ER 9 MG C12A Take 1 capsule by mouth daily. 12/02/22   [provider]  zolpidem  (AMBIEN ) 10 MG tablet Take 10 mg by mouth at bedtime. 04/18/21   [provider]      Allergies    Lisinopril     Review of Systems   Review of Systems  Physical Exam Updated Vital Signs BP 104/72   Pulse 79   Temp 98.4 F (36.9 C) (Oral)   Resp 18   Ht 5\' 4"  (1.626 m)   Wt 85.7 kg   SpO2 93%   BMI 32.44 kg/m  Physical Exam Vitals and nursing note reviewed.  Constitutional:      Appearance: He is well-developed.  HENT:     Head: Normocephalic and atraumatic.  Cardiovascular:     Rate and Rhythm: Normal rate.  Pulmonary:     Effort: Pulmonary effort is normal. No respiratory distress.  Abdominal:     General: There is no distension.  Musculoskeletal:        General: Normal range of motion.     Cervical back: Normal range of motion.  Neurological:     Mental Status: He is alert.     Comments: Patient definitely has left lower facial droop, somewhat weak eyelid on that side and his left eyebrow does not raise as high as his right.  He has good strength in bilateral upper and lower extremities.  His patellar reflex on  the left is diminished compared to his right.  He has different sensation in his distal extremities on the left compared to the right and feels paresthesias there.  Speech is intact.  Vision is at baseline.  Facial sensations at baseline and symmetric.  Uvula raises symmetrically.  No obvious aphasia or dysarthria at this time.     ED Results / Procedures / Treatments   Labs (all labs ordered are listed, but only abnormal results are  displayed) Labs Reviewed  PROTIME-INR  APTT  CBC  DIFFERENTIAL  COMPREHENSIVE METABOLIC PANEL WITH GFR  ETHANOL  I-STAT CHEM 8, ED  CBG MONITORING, ED    EKG None  Radiology No results found.  Procedures Procedures    Medications Ordered in ED Medications  sodium chloride  flush (NS) 0.9 % injection 3 mL (has no administration in time range)    ED Course/ Medical Decision Making/ A&P                                 Medical Decision Making Amount and/or Complexity of Data Reviewed Labs: ordered. Radiology: ordered.  Risk OTC drugs. Prescription drug management. Decision regarding hospitalization.  His facial exam could be consistent with Bell's palsy but does not explain the paresthesias and diminished reflex on the left.  Will evaluate for stroke and get neurology involved after scans.  Patient's last known normal was bedtime on Wednesday (unk time) so was well outside the window for TNK.  Does not have any signs of LVO at this time either so we will hold on any emergent consultation or code stroke activation. CTA reassuring. Will get neuro consult for recommendations.   2/2 patients extremity symptoms, aphasia earlier and noted dysmetria on their exam, feel stroke is still on the differential and suggest admit for stroke workup, MRI, etc.    Final Clinical Impression(s) / ED Diagnoses Final diagnoses:  None    Rx / DC Orders ED Discharge Orders     None         Avid Guillette, Reymundo Caulk, MD 06/03/23 0031

## 2023-06-01 ENCOUNTER — Observation Stay (HOSPITAL_COMMUNITY)

## 2023-06-01 ENCOUNTER — Emergency Department (HOSPITAL_COMMUNITY)

## 2023-06-01 ENCOUNTER — Observation Stay (HOSPITAL_COMMUNITY)
Admit: 2023-06-01 | Discharge: 2023-06-01 | Disposition: A | Discharge status: Home / Self Care | Attending: Family Medicine | Admitting: Family Medicine

## 2023-06-01 DIAGNOSIS — N400 Enlarged prostate without lower urinary tract symptoms: Secondary | ICD-10-CM | POA: Diagnosis not present

## 2023-06-01 DIAGNOSIS — I6389 Other cerebral infarction: Secondary | ICD-10-CM | POA: Diagnosis not present

## 2023-06-01 DIAGNOSIS — R202 Paresthesia of skin: Secondary | ICD-10-CM | POA: Diagnosis not present

## 2023-06-01 DIAGNOSIS — J9611 Chronic respiratory failure with hypoxia: Secondary | ICD-10-CM | POA: Diagnosis not present

## 2023-06-01 DIAGNOSIS — R569 Unspecified convulsions: Secondary | ICD-10-CM

## 2023-06-01 DIAGNOSIS — I700 Atherosclerosis of aorta: Secondary | ICD-10-CM | POA: Diagnosis not present

## 2023-06-01 DIAGNOSIS — J449 Chronic obstructive pulmonary disease, unspecified: Secondary | ICD-10-CM | POA: Diagnosis not present

## 2023-06-01 DIAGNOSIS — F1490 Cocaine use, unspecified, uncomplicated: Secondary | ICD-10-CM | POA: Diagnosis not present

## 2023-06-01 DIAGNOSIS — I10 Essential (primary) hypertension: Secondary | ICD-10-CM | POA: Diagnosis not present

## 2023-06-01 DIAGNOSIS — I639 Cerebral infarction, unspecified: Secondary | ICD-10-CM

## 2023-06-01 DIAGNOSIS — G2581 Restless legs syndrome: Secondary | ICD-10-CM | POA: Diagnosis not present

## 2023-06-01 DIAGNOSIS — R29810 Facial weakness: Secondary | ICD-10-CM | POA: Diagnosis present

## 2023-06-01 DIAGNOSIS — K219 Gastro-esophageal reflux disease without esophagitis: Secondary | ICD-10-CM | POA: Diagnosis not present

## 2023-06-01 DIAGNOSIS — Z794 Long term (current) use of insulin: Secondary | ICD-10-CM | POA: Diagnosis not present

## 2023-06-01 DIAGNOSIS — E119 Type 2 diabetes mellitus without complications: Secondary | ICD-10-CM | POA: Diagnosis not present

## 2023-06-01 DIAGNOSIS — Z7982 Long term (current) use of aspirin: Secondary | ICD-10-CM | POA: Diagnosis not present

## 2023-06-01 LAB — CBC
HCT: 45.6 % (ref 39.0–52.0)
Hemoglobin: 15.2 g/dL (ref 13.0–17.0)
MCH: 29.1 pg (ref 26.0–34.0)
MCHC: 33.3 g/dL (ref 30.0–36.0)
MCV: 87.4 fL (ref 80.0–100.0)
Platelets: 292 10*3/uL (ref 150–400)
RBC: 5.22 MIL/uL (ref 4.22–5.81)
RDW: 15.1 % (ref 11.5–15.5)
WBC: 9.1 10*3/uL (ref 4.0–10.5)
nRBC: 0 % (ref 0.0–0.2)

## 2023-06-01 LAB — COMPREHENSIVE METABOLIC PANEL WITH GFR
ALT: 21 U/L (ref 0–44)
AST: 22 U/L (ref 15–41)
Albumin: 3.8 g/dL (ref 3.5–5.0)
Alkaline Phosphatase: 87 U/L (ref 38–126)
Anion gap: 10 (ref 5–15)
BUN: 14 mg/dL (ref 6–20)
CO2: 30 mmol/L (ref 22–32)
Calcium: 9.3 mg/dL (ref 8.9–10.3)
Chloride: 99 mmol/L (ref 98–111)
Creatinine, Ser: 1.45 mg/dL — ABNORMAL HIGH (ref 0.61–1.24)
GFR, Estimated: 56 mL/min — ABNORMAL LOW (ref >60)
Glucose, Bld: 106 mg/dL — ABNORMAL HIGH (ref 70–99)
Potassium: 3.1 mmol/L — ABNORMAL LOW (ref 3.5–5.1)
Sodium: 139 mmol/L (ref 135–145)
Total Bilirubin: 1.7 mg/dL — ABNORMAL HIGH (ref 0.0–1.2)
Total Protein: 7.3 g/dL (ref 6.5–8.1)

## 2023-06-01 LAB — GLUCOSE, CAPILLARY
Glucose-Capillary: 149 mg/dL — ABNORMAL HIGH (ref 70–99)
Glucose-Capillary: 95 mg/dL (ref 70–99)
Glucose-Capillary: 131 mg/dL — ABNORMAL HIGH (ref 70–99)

## 2023-06-01 LAB — APTT: aPTT: 29 s (ref 24–36)

## 2023-06-01 LAB — I-STAT CHEM 8, ED
BUN: 14 mg/dL (ref 6–20)
Calcium, Ion: 1.15 mmol/L (ref 1.15–1.40)
Chloride: 97 mmol/L — ABNORMAL LOW (ref 98–111)
Creatinine, Ser: 1.5 mg/dL — ABNORMAL HIGH (ref 0.61–1.24)
Glucose, Bld: 104 mg/dL — ABNORMAL HIGH (ref 70–99)
HCT: 44 % (ref 39.0–52.0)
Hemoglobin: 15 g/dL (ref 13.0–17.0)
Potassium: 3.2 mmol/L — ABNORMAL LOW (ref 3.5–5.1)
Sodium: 140 mmol/L (ref 135–145)
TCO2: 32 mmol/L (ref 22–32)

## 2023-06-01 LAB — PROTIME-INR
INR: 0.9 (ref 0.8–1.2)
Prothrombin Time: 12.6 s (ref 11.4–15.2)

## 2023-06-01 LAB — DIFFERENTIAL
Abs Immature Granulocytes: 0.02 10*3/uL (ref 0.00–0.07)
Basophils Absolute: 0.1 10*3/uL (ref 0.0–0.1)
Basophils Relative: 1 %
Eosinophils Absolute: 0.2 10*3/uL (ref 0.0–0.5)
Eosinophils Relative: 3 %
Immature Granulocytes: 0 %
Lymphocytes Relative: 24 %
Lymphs Abs: 2.1 10*3/uL (ref 0.7–4.0)
Monocytes Absolute: 0.7 10*3/uL (ref 0.1–1.0)
Monocytes Relative: 8 %
Neutro Abs: 5.9 10*3/uL (ref 1.7–7.7)
Neutrophils Relative %: 64 %

## 2023-06-01 LAB — ECHOCARDIOGRAM COMPLETE
AR max vel: 2.5 cm2
AV Area VTI: 2.37 cm2
AV Area mean vel: 2.41 cm2
AV Mean grad: 3 mmHg
AV Peak grad: 5.9 mmHg
Ao pk vel: 1.21 m/s
Area-P 1/2: 3.72 cm2
Height: 64 in
S' Lateral: 2.7 cm
Weight: 3024 [oz_av]

## 2023-06-01 LAB — CBG MONITORING, ED: Glucose-Capillary: 111 mg/dL — ABNORMAL HIGH (ref 70–99)

## 2023-06-01 LAB — HEMOGLOBIN A1C
Hgb A1c MFr Bld: 6 % — ABNORMAL HIGH (ref 4.8–5.6)
Mean Plasma Glucose: 125.5 mg/dL

## 2023-06-01 LAB — ETHANOL: Alcohol, Ethyl (B): <15 mg/dL (ref <15)

## 2023-06-01 LAB — HIV ANTIBODY (ROUTINE TESTING W REFLEX): HIV Screen 4th Generation wRfx: NONREACTIVE

## 2023-06-01 MED ORDER — IOHEXOL 350 MG/ML SOLN
75.0000 mL | Freq: Once | INTRAVENOUS | Status: AC | PRN
  Administered 2023-06-01: 75 mL via INTRAVENOUS

## 2023-06-01 MED ORDER — HEPARIN SODIUM (PORCINE) 5000 UNIT/ML IJ SOLN
5000.0000 [IU] | Freq: Three times a day (TID) | INTRAMUSCULAR | Status: DC
Start: 2023-06-01 — End: 2023-06-02
  Administered 2023-06-02: 5000 [IU] via SUBCUTANEOUS
  Filled 2023-06-01 (×2): qty 1

## 2023-06-01 MED ORDER — OXYCODONE-ACETAMINOPHEN 10-325 MG PO TABS: 1.0000 | ORAL_TABLET | Freq: Four times a day (QID) | ORAL | Status: DC | PRN

## 2023-06-01 MED ORDER — LACTULOSE 10 GM/15ML PO SOLN
10.0000 g | Freq: Every day | ORAL | Status: DC
  Administered 2023-06-01 – 2023-06-02 (×2): 10 g via ORAL
  Filled 2023-06-01 (×2): qty 30

## 2023-06-01 MED ORDER — SODIUM CHLORIDE 0.9% FLUSH
3.0000 mL | Freq: Two times a day (BID) | INTRAVENOUS | Status: DC
  Administered 2023-06-01 – 2023-06-02 (×2): 3 mL via INTRAVENOUS

## 2023-06-01 MED ORDER — ALBUTEROL SULFATE (2.5 MG/3ML) 0.083% IN NEBU: 2.5000 mg | INHALATION_SOLUTION | RESPIRATORY_TRACT | Status: DC | PRN

## 2023-06-01 MED ORDER — OXYCODONE-ACETAMINOPHEN 5-325 MG PO TABS
1.0000 | ORAL_TABLET | Freq: Four times a day (QID) | ORAL | Status: DC | PRN
  Administered 2023-06-01 – 2023-06-02 (×2): 1 via ORAL
  Filled 2023-06-01 (×2): qty 1

## 2023-06-01 MED ORDER — POLYETHYLENE GLYCOL 3350 17 G PO PACK: 17.0000 g | PACK | Freq: Every day | ORAL | Status: DC | PRN

## 2023-06-01 MED ORDER — TAMSULOSIN HCL 0.4 MG PO CAPS
0.4000 mg | ORAL_CAPSULE | Freq: Every day | ORAL | Status: DC
  Administered 2023-06-01 – 2023-06-02 (×2): 0.4 mg via ORAL
  Filled 2023-06-01 (×2): qty 1

## 2023-06-01 MED ORDER — ACETAMINOPHEN 325 MG PO TABS: 650.0000 mg | ORAL_TABLET | Freq: Four times a day (QID) | ORAL | Status: DC | PRN

## 2023-06-01 MED ORDER — SODIUM CHLORIDE 0.9% FLUSH
3.0000 mL | Freq: Two times a day (BID) | INTRAVENOUS | Status: DC
  Administered 2023-06-01 – 2023-06-02 (×3): 3 mL via INTRAVENOUS

## 2023-06-01 MED ORDER — ONDANSETRON HCL 4 MG PO TABS: 4.0000 mg | ORAL_TABLET | Freq: Four times a day (QID) | ORAL | Status: DC | PRN

## 2023-06-01 MED ORDER — BISACODYL 10 MG RE SUPP: 10.0000 mg | Freq: Every day | RECTAL | Status: DC | PRN

## 2023-06-01 MED ORDER — IPRATROPIUM-ALBUTEROL 0.5-2.5 (3) MG/3ML IN SOLN
3.0000 mL | Freq: Three times a day (TID) | RESPIRATORY_TRACT | Status: DC
  Administered 2023-06-01 – 2023-06-02 (×2): 3 mL via RESPIRATORY_TRACT
  Filled 2023-06-01: qty 3
  Filled 2023-06-01: qty 123
  Filled 2023-06-01: qty 3

## 2023-06-01 MED ORDER — ASPIRIN 325 MG PO TABS
325.0000 mg | ORAL_TABLET | Freq: Once | ORAL | Status: AC
  Administered 2023-06-01: 325 mg via ORAL
  Filled 2023-06-01: qty 1

## 2023-06-01 MED ORDER — ASPIRIN 81 MG PO TBEC
81.0000 mg | DELAYED_RELEASE_TABLET | Freq: Every day | ORAL | Status: DC
  Administered 2023-06-02: 81 mg via ORAL
  Filled 2023-06-01: qty 1

## 2023-06-01 MED ORDER — ATORVASTATIN CALCIUM 40 MG PO TABS
80.0000 mg | ORAL_TABLET | Freq: Every day | ORAL | Status: DC
  Administered 2023-06-01 – 2023-06-02 (×2): 80 mg via ORAL
  Filled 2023-06-01 (×2): qty 2

## 2023-06-01 MED ORDER — OXYCODONE HCL 5 MG PO TABS
5.0000 mg | ORAL_TABLET | Freq: Four times a day (QID) | ORAL | Status: DC | PRN
  Administered 2023-06-01 – 2023-06-02 (×2): 5 mg via ORAL
  Filled 2023-06-01 (×2): qty 1

## 2023-06-01 MED ORDER — CLOPIDOGREL BISULFATE 75 MG PO TABS
75.0000 mg | ORAL_TABLET | Freq: Every day | ORAL | Status: DC
  Administered 2023-06-01 – 2023-06-02 (×2): 75 mg via ORAL
  Filled 2023-06-01 (×2): qty 1

## 2023-06-01 MED ORDER — OXYCODONE HCL ER 10 MG PO T12A
10.0000 mg | EXTENDED_RELEASE_TABLET | Freq: Every day | ORAL | Status: DC
  Administered 2023-06-01: 10 mg via ORAL
  Filled 2023-06-01: qty 1

## 2023-06-01 MED ORDER — PRAMIPEXOLE DIHYDROCHLORIDE 0.25 MG PO TABS
0.7500 mg | ORAL_TABLET | Freq: Every day | ORAL | Status: DC
  Administered 2023-06-01 – 2023-06-02 (×2): 0.75 mg via ORAL
  Filled 2023-06-01 (×4): qty 3

## 2023-06-01 MED ORDER — PRAMIPEXOLE DIHYDROCHLORIDE 1 MG PO TABS
1.5000 mg | ORAL_TABLET | Freq: Every day | ORAL | Status: DC
  Administered 2023-06-01: 1.5 mg via ORAL
  Filled 2023-06-01: qty 2

## 2023-06-01 MED ORDER — SODIUM CHLORIDE 0.9 % IV SOLN
INTRAVENOUS | Status: AC | PRN
Start: 2023-06-01 — End: 2023-06-02

## 2023-06-01 MED ORDER — ZOLPIDEM TARTRATE 5 MG PO TABS
10.0000 mg | ORAL_TABLET | Freq: Every day | ORAL | Status: DC
  Administered 2023-06-01: 10 mg via ORAL
  Filled 2023-06-01: qty 2

## 2023-06-01 MED ORDER — METOPROLOL SUCCINATE ER 25 MG PO TB24
25.0000 mg | ORAL_TABLET | Freq: Every day | ORAL | Status: DC
  Administered 2023-06-01 – 2023-06-02 (×2): 25 mg via ORAL
  Filled 2023-06-01 (×2): qty 1

## 2023-06-01 MED ORDER — PREGABALIN 75 MG PO CAPS
150.0000 mg | ORAL_CAPSULE | Freq: Every day | ORAL | Status: DC | PRN
  Administered 2023-06-01: 150 mg via ORAL
  Filled 2023-06-01: qty 2

## 2023-06-01 MED ORDER — INSULIN ASPART 100 UNIT/ML IJ SOLN
0.0000 [IU] | Freq: Three times a day (TID) | INTRAMUSCULAR | Status: DC
Start: 2023-06-01 — End: 2023-06-02
  Administered 2023-06-01: 1 [IU] via SUBCUTANEOUS

## 2023-06-01 MED ORDER — PANTOPRAZOLE SODIUM 40 MG PO TBEC
40.0000 mg | DELAYED_RELEASE_TABLET | Freq: Two times a day (BID) | ORAL | Status: DC
  Administered 2023-06-01 – 2023-06-02 (×3): 40 mg via ORAL
  Filled 2023-06-01 (×3): qty 1

## 2023-06-01 MED ORDER — ACETAMINOPHEN 650 MG RE SUPP: 650.0000 mg | Freq: Four times a day (QID) | RECTAL | Status: DC | PRN

## 2023-06-01 MED ORDER — ONDANSETRON HCL 4 MG/2ML IJ SOLN
4.0000 mg | Freq: Four times a day (QID) | INTRAMUSCULAR | Status: DC | PRN
Start: 2023-06-01 — End: 2023-06-02

## 2023-06-01 MED ORDER — INSULIN ASPART 100 UNIT/ML IJ SOLN: 0.0000 [IU] | Freq: Every day | INTRAMUSCULAR | Status: DC

## 2023-06-01 MED ORDER — SODIUM CHLORIDE 0.9% FLUSH: 3.0000 mL | INTRAVENOUS | Status: DC | PRN

## 2023-06-01 NOTE — Plan of Care (Signed)

## 2023-06-01 NOTE — Progress Notes (Signed)
 SLP Cancellation Note  Patient Details Name: Jerry Aguirre MRN: [id number] DOB: 1965/12/12   Cancelled treatment:       Reason Eval/Treat Not Completed: Pt passed the [facility] stroke swallow screen and is tolerating diet without incident. Our service will sign off. Thank you for this referral,  Kaysan Peixoto H. Vergil Glasser, CCC-SLP Speech Language Pathologist    Florina Husbands 06/01/2023, 8:23 AM

## 2023-06-01 NOTE — Progress Notes (Signed)
 EEG complete - results pending

## 2023-06-01 NOTE — ED Notes (Signed)
 O2 [employer] 2.5L placed on pt. Pt wears CPAP with O2 at home during sleep.

## 2023-06-01 NOTE — ED Notes (Signed)
 ED Provider at bedside.

## 2023-06-01 NOTE — Progress Notes (Addendum)
 OT recommending outpatient OT. Pt reports he has been going to outpatient OT and 2 weeks ago his therapist said he could stop if he wanted to, so pt did. He does not feel it is necessary at this time. Pt aware to contact PCP if new referral is needed after d/c.    06/01/23 1108  TOC Brief Assessment  Insurance and Status Reviewed  Patient has primary care physician Yes  Home environment has been reviewed Lives alone.  Prior level of function: Independent.  Prior/Current Home Services No current home services  Social Drivers of Health Review SDOH reviewed no interventions necessary  Readmission risk has been reviewed Yes  Transition of care needs no transition of care needs at this time

## 2023-06-01 NOTE — ED Notes (Signed)
 Tele neuro at bedside doing evaluation

## 2023-06-01 NOTE — Telephone Encounter (Signed)
 Pt has been admitted to the hospital

## 2023-06-01 NOTE — Progress Notes (Signed)
*  PRELIMINARY RESULTS* Echocardiogram 2D Echocardiogram has been performed.  Bernis Brisker 06/01/2023, 12:49 PM

## 2023-06-01 NOTE — Consult Note (Signed)
 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 He does report eye is tearing, L ear and neck pain. Although bells palsy is a consideration, his exam is more suggestive of stroke with L sided sensory changes and ataxia on his L FNF/ drift in L leg. CTH and CTA are without acute findings. No LVO. He also had LOC of unclear etiology and unwitnessed seizure or syncope are on differential as well as stroke and neoplasm. I recommend initiate dual antiplatelets and stroke workup.     Recommendations: Our recommendations are outlined below.  Diagnostic Studies : MRI head with and without contrast Routine EEG Holter/Loop Recorder as outpatient with cardiology follow up echo  Laboratory Studies : Lipid panel I ordered Hemoglobin A1c  Antithrombotic Medication : Initiate dual antiplatelet therapy with Aspirin  81 mg daily and Clopidogrel 75 mg daily Statins for LDL goal less than 70  Nursing Recommendations : IV Fluids, avoid dextrose  containing fluids, Maintain euglycemia Neuro checks q4 hrs x 24 hrs and then per shift Head of bed 30 degrees Continue with Telemetry  Consultations : Recommend Speech therapy if failed dysphagia screen Physical therapy/Occupational therapy Smoking cessation counselling recommended Inpatient rehab if recommended by physical/occupational therapy  DVT Prophylaxis : Choice of Primary Team  Disposition : Neurology will  follow    ----------------------------------------------------------------------------------------------------   Advanced Imaging: CTA Head and Neck Completed.  LVO:No  Patient is not a candidate for NIR    Metrics: Dispatch Time: 06/01/2023 02:31:09 Callback Response Time: 06/01/2023 02:33:26  Primary Provider Notified of Diagnostic Impression and Management Plan on: 06/01/2023 05:17:24   CT HEAD: I personally reviewed all the CT images that were available to me and it showed: No Acute Hemorrhage or Acute Core Infarct    ----------------------------------------------------------------------------------------------------  Chief Complaint: L facial droop, slurred speech, loss of consciousness  History of Present Illness: Patient is a 58 year old Male. LKWT was 2 days ago. It happened yesterday and the night before. She was sitting on the bedside because he has swelling. then he was on the floor and doesn't remember falling but likely passed out. all day yesterday he had facial droop and wasn't able to drink. His eye is still watering. He has pain from ear down his neck. he has been having slurred speech. He is able to walk but it is wobbly. He had skin cancer causing L ear pain before. He has some chronic gait issues, uses cane. Denies dizziness, headache.   Past Medical History:      Hypertension      Diabetes Mellitus      Hyperlipidemia      Coronary Artery Disease      There is no history of Stroke Other PMH:  COPD  lumbar stenosis  RLS  back surgery  esophageal cancer removed (no radiation or chemo).  Medications:  No Anticoagulant use  No Antiplatelet use Reviewed EMR for current medications  Allergies:  Reviewed  Social History: Smoking: Yes  Family History:  There is no family history of premature cerebrovascular disease pertinent to this  consultation  ROS : 14 Points Review of Systems was performed and was negative except mentioned in  HPI.  Past Surgical History: There Is No Surgical History Contributory To Today's Visit   Examination: BP(101/71), Pulse(66), 1A: Level of Consciousness - Alert; keenly responsive + 0 1B: Ask Month and Age - Both Questions Right + 0 1C: Blink Eyes & Squeeze Hands - Performs Both Tasks + 0 2: Test Horizontal Extraocular Movements - Normal + 0 3: Test Visual Fields - No Visual Loss + 0 4: Test Facial Palsy (Use Grimace if Obtunded) - Partial paralysis (lower face) + 2 5A: Test Left Arm Motor Drift - No Drift for 10 Seconds + 0 5B: Test Right Arm Motor Drift - No Drift for 10 Seconds + 0 6A: Test Left Leg Motor Drift - Drift, but doesn't hit bed + 1 6B: Test Right Leg Motor Drift - No Drift for 5 Seconds + 0 7: Test Limb Ataxia (FNF/Heel-Shin) - No Ataxia + 0 8: Test Sensation - Mild-Moderate Loss: Less Sharp/More Dull + 1 9: Test Language/Aphasia - Normal; No aphasia + 0 10: Test Dysarthria - Mild-Moderate Dysarthria: Slurring but can be understood + 1 11: Test Extinction/Inattention - No abnormality + 0  NIHSS Score: 5 NIHSS Free Text : reduced sensation L face, arm, leg  Spoke with : Dr Luberta Ruse    This consult was conducted in real time using interactive audio and Immunologist. Patient was informed of the technology being used for this visit and agreed to proceed. Patient located in hospital and provider located at home/office setting.  Patient is being evaluated for possible acute neurologic impairment and high probability of imminent or life - threatening deterioration.I spent total of 35 minutes providing care to this patient, including time for face to face visit via telemedicine, review of medical records, imaging studies and discussion of findings with providers, the patient and / or family.   Dr Ilean Mall   [facility] For Inpatient follow-up with [facility] physician please call [facility] at [phone]. As we are not an outpatient service for any post  hospital discharge needs please contact the hospital for assistance.  If you have any questions for the [facility] physicians or need to reconsult for clinical or diagnostic changes please contact us  via [facility] at [phone].

## 2023-06-01 NOTE — H&P (Signed)
 History and Physical    Patient: Jerry Aguirre ZOX:[id number] DOB: 1965/05/12 DOA: 05/31/2023 DOS: the patient was seen and examined on 06/01/2023 PCP: Tobi Fortes, MD  Patient coming from: Home  Chief Complaint:  Chief Complaint  Patient presents with   Facial Droop   HPI: Jerry Aguirre is a 58 y.o. male with medical history significant of chronic hypoxic respiratory failure due to COPD, patient quit smoking in April 2025, GERD, history of head head and neck malignancy, HTN, obesity with OSA, HLD who presents to the ED with concerns of left-sided facial droop, gait problems/ataxia, last known normal around 1:30 AM on 05/30/2023.  Patient said he fell out of bed found himself on the floor is not sure how he fell off the bed--?? Syncope,  query seizures.  No prior history of seizures --Additional history obtained from patient's sister Arlina Lair at bedside -No chest pains no palpitations no dizziness - In the ED CTA head and neck without LVO patient has chronic small vessel disease -MRI brain pending - EEG without epileptiform findings - Echo with EF of 55 to 60%, no diastolic dysfunction, no regional wall motion normalities, no mitral stenosis, no aortic stenosis - A1c 6.0 - CBC WNL - Potassium 3.1, sodium 139, creatinine 1.45 (creatinine was 1.36 on January 22, 2023), LFTs not elevated except for T. bili of 1.7   Review of Systems: As mentioned in the history of present illness. All other systems reviewed and are negative. Past Medical History:  Diagnosis Date   COPD (chronic obstructive pulmonary disease) (HCC)    Dyspnea    GERD (gastroesophageal reflux disease)    Headache    Hyperlipidemia    Hypertension    Sleep apnea    pt says his "OSA was mild and they said he did not need a CPAP"   Past Surgical History:  Procedure Laterality Date   BIOPSY  12/22/2021   Procedure: BIOPSY;  Surgeon: Urban Garden, MD;  [facility]: [facility];  Service: Gastroenterology;;    COLONOSCOPY WITH PROPOFOL  N/A 12/22/2021   Procedure: COLONOSCOPY WITH PROPOFOL ;  Surgeon: Urban Garden, MD;  Location: [facility];  Service: Gastroenterology;  Laterality: N/A;  10:15am, asa 3   ESOPHAGOGASTRODUODENOSCOPY (EGD) WITH PROPOFOL  N/A 12/22/2021   Procedure: ESOPHAGOGASTRODUODENOSCOPY (EGD) WITH PROPOFOL ;  Surgeon: Urban Garden, MD;  Location: [facility];  Service: Gastroenterology;  Laterality: N/A;   HERNIA REPAIR Right    inguinal   LUMBAR LAMINECTOMY/DECOMPRESSION MICRODISCECTOMY N/A 04/21/2021   Procedure: LAMINECTOMY, MEDIAL FACETECTOMY LUMBAR FOUR- FIVE;  Surgeon: Van Gelinas, MD;  Location: [facility] OR;  Service: Neurosurgery;  Laterality: N/A;   POLYPECTOMY  12/22/2021   Procedure: POLYPECTOMY;  Surgeon: Urban Garden, MD;  Location: [facility];  Service: Gastroenterology;;   TONSILLECTOMY     removed as a child   Social History:  reports that he has quit smoking. His smoking use included cigarettes. He has been exposed to tobacco smoke. He has never used smokeless tobacco. He reports that he does not currently use alcohol. He reports current drug use. Drug: Marijuana.  Allergies  Allergen Reactions   Lisinopril  Itching    headaches    Family History  Problem Relation Age of Onset   Esophageal cancer Mother    Lung cancer Father     Prior to Admission medications   Medication Sig Start Date End Date Taking? Authorizing Provider  albuterol  (VENTOLIN  HFA) 108 (90 Base) MCG/ACT inhaler Inhale 2 puffs into the lungs  every 4 (four) hours as needed for wheezing or shortness of breath. 01/22/23  Yes Tobi Fortes, MD  atorvastatin  (LIPITOR) 80 MG tablet Take 1 tablet (80 mg total) by mouth daily. 01/23/23  Yes Tobi Fortes, MD  baclofen  (LIORESAL ) 10 MG tablet Take 1 tablet (10 mg total) by mouth 2 (two) times daily as needed. 05/14/23  Yes Alison Irvine, FNP  cetirizine  (ZYRTEC ) 10 MG tablet Take 1 tablet (10 mg total) by  mouth daily. 05/14/23  Yes Alison Irvine, FNP  cyclobenzaprine  (FLEXERIL ) 10 MG tablet Take 1 tablet (10 mg total) by mouth 3 (three) times daily as needed for muscle spasms. 05/14/23  Yes Alison Irvine, FNP  furosemide  (LASIX ) 40 MG tablet TAKE (1) TABLET BY MOUTH THREE TIMES DAILY AS NEEDED FOR FLUID OR EDEMA 05/15/23  Yes Tobi Fortes, MD  lactulose  (CHRONULAC ) 10 GM/15ML solution Take 10 g by mouth at bedtime. 03/21/21  Yes [provider]  meloxicam  (MOBIC ) 7.5 MG tablet Take 1 tablet (7.5 mg total) by mouth 2 (two) times daily. 11/09/21  Yes Tobi Fortes, MD  Menthol -Zinc Oxide (GOLD BOND EX) Apply 1 Application topically daily as needed (dry skin).   Yes [provider]  metFORMIN  (GLUCOPHAGE -XR) 500 MG 24 hr tablet Take 1 tablet (500 mg total) by mouth daily with breakfast. 05/14/23  Yes Alison Irvine, FNP  metoprolol  succinate (TOPROL -XL) 25 MG 24 hr tablet Take 1 tablet (25 mg total) by mouth daily. 05/14/23  Yes Alison Irvine, FNP  ondansetron  (ZOFRAN ) 4 MG tablet Take 1 tablet (4 mg total) by mouth 2 (two) times daily. 05/14/23  Yes Alison Irvine, FNP  oxyCODONE -acetaminophen  (PERCOCET) 10-325 MG tablet TAKE 1 TABLET EVERY 4-6 HOURS. MAX 5 PER DAY FOR 30 DAYS Patient taking differently: Take 1 tablet by mouth every 4 (four) hours as needed for pain. 06/05/22  Yes Tobi Fortes, MD  OXYCONTIN  10 MG 12 hr tablet Take 10 mg by mouth at bedtime. 04/02/23  Yes [provider]  pantoprazole  (PROTONIX ) 40 MG tablet Take 1 tablet (40 mg total) by mouth 2 (two) times daily. 05/14/23  Yes Alison Irvine, FNP  pramipexole  (MIRAPEX ) 0.75 MG tablet Take 1-2 tablets (0.75-1.5 mg total) by mouth See admin instructions. Take 0.75 mg in the morning and 1.5 mg at night 05/14/23  Yes Huenink, Rice Chamorro, FNP  pregabalin  (LYRICA ) 150 MG capsule Take 1 capsule (150 mg total) by mouth daily as needed. 05/14/23  Yes Alison Irvine, FNP  Semaglutide , 1 MG/DOSE, (OZEMPIC , 1 MG/DOSE,) 4  MG/3ML SOPN Inject 1 mg into the skin once a week. 05/09/23  Yes Tobi Fortes, MD  tamsulosin  (FLOMAX ) 0.4 MG CAPS capsule Take 1 capsule (0.4 mg total) by mouth daily. 05/14/23  Yes Alison Irvine, FNP  triamterene -hydrochlorothiazide  (DYAZIDE ) 37.5-25 MG capsule Take 1 each (1 capsule total) by mouth daily. 05/14/23  Yes Alison Irvine, FNP  Vitamin D , Ergocalciferol , (DRISDOL ) 1.25 MG (50000 UNIT) CAPS capsule Take 1 capsule (50,000 Units total) by mouth every 7 (seven) days. 04/03/23  Yes Dixon, Phillip E, MD  zolpidem (AMBIEN) 10 MG tablet Take 10 mg by mouth at bedtime. 04/18/21  Yes [provider]  ACCU-CHEK GUIDE TEST test strip TEST EVERY DAY 05/03/23   Tobi Fortes, MD  Accu-Chek Softclix Lancets lancets USE AS DIRECTED TO CHECK BLOOD SUGAR IN THE MORNING, AT NOON, AND AT BEDTIME. 04/27/22   Tobi Fortes, MD  Blood Glucose Monitoring Suppl DEVI 1 each by Does not apply  route in the morning, at noon, and at bedtime. May substitute to any manufacturer covered by patient's insurance. 04/27/22   Tobi Fortes, MD  Elastic Bandages & Supports (MEDICAL COMPRESSION STOCKINGS) MISC 2 each by Does not apply route daily. 11/23/21   Tobi Fortes, MD  Simethicone  125 MG CAPS Take 1 capsule (125 mg total) by mouth every 6 (six) hours as needed (bloating). 12/22/21   Urban Garden, MD    Physical Exam: Vitals:   06/01/23 1040 06/01/23 1235 06/01/23 1546 06/01/23 1708  BP: 99/74 122/83 103/74 117/74  Pulse: 65 64 65 66  Resp: 18 17 17 18   Temp: 98.2 F (36.8 C) 98 F (36.7 C) 97.9 F (36.6 C) 98 F (36.7 C)  TempSrc: Oral Oral Oral Oral  SpO2: 90% 98% 94% 100%  Weight:      Height:        Physical Exam  Gen:- Awake Alert, in no acute distress  HEENT:- [place].AT, No sclera icterus Neck-Supple Neck,No JVD,.  Lungs-  CTAB , fair air movement bilaterally  CV- S1, S2 normal, RRR Abd-  +ve B.Sounds, Abd Soft, No tenderness,    Extremity/Skin:- No  edema,   good  pedal pulses  Psych-affect is appropriate, oriented x3 Neuro-left-sided facial droop and left-sided sensory deficit deficits of the upper extremity , ataxia/gait disturbance improving, no new focal deficits, no tremors  Data Reviewed: CTA head and neck without LVO patient has chronic small vessel disease -MRI brain pending - EEG without epileptiform findings - Echo with EF of 55 to 60%, no diastolic dysfunction, no regional wall motion normalities, no mitral stenosis, no aortic stenosis - A1c 6.0 - CBC WNL - Potassium 3.1, sodium 139, creatinine 1.45 (creatinine was 1.36 on January 22, 2023), LFTs not elevated except for T. bili of 1.7  Assessment and Plan: 1)Acute CVA -clinically suspect acute stroke Brain MRI requested and pending CTA head and neck without LVO patient has chronic small vessel disease -MRI brain pending - EEG without epileptiform findings - Echo with EF of 55 to 60%, no diastolic dysfunction, no regional wall motion normalities, no mitral stenosis, no aortic stenosis - A1c 6.0 -Lipid profile pending -- place on telemetry monitored unit,  Take Aspirin  81 mg daily along with Plavix 75 mg daily for 21 days then after that STOP the Plavix  and continue ONLY Aspirin  81 mg daily indefinitely--for secondary stroke Prevention (Per The multicenter SAMMPRIS trial) -Check lipid profile, give atorvastatin  -Neurology consult appreciated - PT/OT eval appreciated - Use Hydralazine 10mg  or Labetalol  10 mg iv every 4 hrs as needed for systolic blood pressure over 210 mmhg  #Please maintain euthermia. Tylenol  prn for temp >100.4 # Telemetry monitoring # Frequent neuro checks --May need Holter monitor at discharge - 2)HTN--metoprolol  as ordered avoid overaggressive BP control at this time - IV hydralazine as needed elevated BP per parameters  3)GERD--continue Protonix   4)BPH--continue Flomax   5)RLS--continue Mirapex   6) COPD with chronic hypoxic respiratory failure--no acute  exacerbation at this time - Congratulated on quitting smoking back in April 2025 - Oxygen requirement at baseline - Continue bronchodilators - Hold off on steroids  7) chronic pain syndrome--continue PTA opiates   Advance Care Planning:   Code Status: Full Code   Consults: Neurology  Family Communication: Sister Arlina Lair at bedside  Severity of Illness: The appropriate patient status for this patient is OBSERVATION. Observation status is judged to be reasonable and necessary in order to provide the required intensity of service to  ensure the patient's safety. The patient's presenting symptoms, physical exam findings, and initial radiographic and laboratory data in the context of their medical condition is felt to place them at decreased risk for further clinical deterioration. Furthermore, it is anticipated that the patient will be medically stable for discharge from the hospital within 2 midnights of admission.   Author: Colin Dawley, MD 06/01/2023 7:18 PM  For on call review [email or web address].

## 2023-06-01 NOTE — Evaluation (Signed)
 Physical Therapy Evaluation Patient Details Name: Jerry Aguirre MRN: [id number] DOB: May 04, 1965 Today's Date: 06/01/2023  History of Present Illness  58 yo M here with sister secondary to left sided facial droop, stuttering, left arm and leg parethesias  Clinical Impression  Patient functioning near baseline for functional mobility and gait demonstrating good return for ambulating in room, hallway using his cane with mostly step-through pattern without loss of balance, on room air with SpO2 dropping from 95% to 87% and put back on 2 LPM O2 after sitting in chair. Plan:  Patient discharged from physical therapy to care of nursing for ambulation daily as tolerated for length of stay.          If plan is discharge home, recommend the following: Help with stairs or ramp for entrance   Can travel by private vehicle        Equipment Recommendations None recommended by PT  Recommendations for Other Services       Functional Status Assessment Patient has had a recent decline in their functional status and/or demonstrates limited ability to make significant improvements in function in a reasonable and predictable amount of time     Precautions / Restrictions Precautions Precautions: Fall Recall of Precautions/Restrictions: Intact Restrictions Weight Bearing Restrictions Per Provider Order: No      Mobility  Bed Mobility Overal bed mobility: Independent                  Transfers Overall transfer level: Modified independent                 General transfer comment: required [facility]    Ambulation/Gait Ambulation/Gait assistance: Modified independent (Device/Increase time) Gait Distance (Feet): 75 Feet Assistive device: Straight cane Gait Pattern/deviations: Step-through pattern, Decreased step length - right, Decreased step length - left, Decreased stride length Gait velocity: decreased     General Gait Details: slightly labored movement with good return for  ambulating in room, hallway using SPC with mostly step-through pattern without loss of balance  Stairs            Wheelchair Mobility     Tilt Bed    Modified Rankin (Stroke Patients Only)       Balance Overall balance assessment: Needs assistance Sitting-balance support: Feet supported, No upper extremity supported Sitting balance-Leahy Scale: Good Sitting balance - Comments: seated at EOB   Standing balance support: During functional activity, Single extremity supported Standing balance-Leahy Scale: Fair Standing balance comment: fair/good using his SPC                             Pertinent Vitals/Pain Pain Assessment Pain Assessment: Faces Faces Pain Scale: Hurts a little bit Pain Location: back and legs Pain Descriptors / Indicators: Sore Pain Intervention(s): Limited activity within patient's tolerance, Monitored during session, Repositioned    Home Living Family/patient expects to be discharged to:: Private residence Living Arrangements: Alone Available Help at Discharge: Family;Personal care attendant;Available PRN/intermittently Type of Home: Mobile home Home Access: Ramped entrance       Home Layout: One level Home Equipment: Shower seat;Grab bars - tub/shower;Cane - quad;Standard Otho Blitz;Hospital bed Additional Comments: home health aide 7 days a week from 3:45 PM to 11:45 PM. Wears O2 when sleeping.    Prior Function                       Extremity/Trunk Assessment   Upper Extremity Assessment Upper Extremity Assessment:  Defer to OT evaluation    Lower Extremity Assessment Lower Extremity Assessment: Overall WFL for tasks assessed;LLE deficits/detail LLE Deficits / Details: grossly 4+/5 LLE Sensation: decreased light touch LLE Coordination: WNL       Communication   Communication Communication: No apparent difficulties    Cognition Arousal: Alert Behavior During Therapy: WFL for tasks assessed/performed   PT -  Cognitive impairments: No apparent impairments                         Following commands: Intact       Cueing Cueing Techniques: Verbal cues     General Comments      Exercises     Assessment/Plan    PT Assessment Patient does not need any further PT services  PT Problem List         PT Treatment Interventions      PT Goals (Current goals can be found in the Care Plan section)  Acute Rehab PT Goals Patient Stated Goal: return home family to assist PT Goal Formulation: With patient Time For Goal Achievement: 06/01/23 Potential to Achieve Goals: Good    Frequency       Co-evaluation PT/OT/SLP Co-Evaluation/Treatment: Yes Reason for Co-Treatment: To address functional/ADL transfers PT goals addressed during session: Mobility/safety with mobility;Balance         AM-PAC PT "6 Clicks" Mobility  Outcome Measure Help needed turning from your back to your side while in a flat bed without using bedrails?: None Help needed moving from lying on your back to sitting on the side of a flat bed without using bedrails?: None Help needed moving to and from a bed to a chair (including a wheelchair)?: None Help needed standing up from a chair using your arms (e.g., wheelchair or bedside chair)?: None Help needed to walk in hospital room?: A Little Help needed climbing 3-5 steps with a railing? : A Little 6 Click Score: 22    End of Session Equipment Utilized During Treatment: Oxygen Activity Tolerance: Patient tolerated treatment well;Patient limited by fatigue Patient left: in chair;with call bell/phone within reach Nurse Communication: Mobility status PT Visit Diagnosis: Unsteadiness on feet (R26.81);Other abnormalities of gait and mobility (R26.89);Muscle weakness (generalized) (M62.81)    Time: 1914-7829 PT Time Calculation (min) (ACUTE ONLY): 19 min   Charges:   PT Evaluation $PT Eval Low Complexity: 1 Low PT Treatments $Therapeutic Activity: 8-22  mins PT General Charges $$ ACUTE PT VISIT: 1 Visit         2:02 PM, 06/01/23 Walton Guppy, MPT Physical Therapist with [facility] [phone] office [phone] mobile phone

## 2023-06-01 NOTE — Plan of Care (Signed)
 Neurology plan of care  This is a 58 year old gentleman with a past medical history significant for smoking, hypertension, hyperlipidemia, RLS, ear pain secondary to skin cancer who presents with left facial droop, worsening gait difficulty, and slurred speech x 24 hours.  He was seen by telespecialists this morning.  They reported that his exam favored stroke with left-sided sensory changes and ataxia on left finger-nose-finger as well as drift in the left leg and recommended stroke workup.  CT head personal review showed no acute process.  CTA head and neck showed no hemodynamically significant stenosis. MRI brain with and without contrast (2/2 hx malignancy) is ordered and pending. TTE performed, results pending. He also had an episode of LOC and EEG is ordered for further evaluation.  Updated recommendations: - Permissive HTN x48 hrs from sx onset or until stroke ruled out by MRI goal BP <220/110. PRN labetalol  or hydralazine if BP above these parameters. Avoid oral antihypertensives. - MRI brain with and without contrast - TTE - ASA 81mg  daily + plavix 75mg  daily x21 days f/b ASA 81mg  daily monotherapy after that - Atorvastatin  80mg  daily - rEEG - q4 hr neuro checks - STAT head CT for any change in neuro exam - Tele - PT/OT/SLP - Stroke education - Amb referral to neurology upon discharge   Greg Leaks, MD [facility] [phone]  If 7pm- 7am, please page neurology on call as listed in AMION.

## 2023-06-01 NOTE — Evaluation (Signed)
 Occupational Therapy Evaluation Patient Details Name: Jerry Aguirre MRN: [id number] DOB: Jan 05, 1966 Today's Date: 06/01/2023   History of Present Illness   58 yo M here with sister secondary to left sided facial droop, stuttering, left arm and leg parethesias (per MD)     Clinical Impressions Pt agreeable to OT and PT co-evaluation. Pt appears to be near baseline function for mobility and ADL's. Independent for donning of shoes. L UE sensation deficits reported. Mild weakness in different areas of B UE but pt has some confounding variables such as carpal tunnel issues at baseline. Pt is able to ambulate well with the cane but did desaturate some while not on supplemental O2. Pt left in the chair with call bell within reach. Pt is not recommended for further acute OT services and will be discharged to care of nursing staff for remaining length of stay.      If plan is discharge home, recommend the following:   Assist for transportation     Functional Status Assessment   Patient has not had a recent decline in their functional status     Equipment Recommendations   None recommended by OT             Precautions/Restrictions   Precautions Precautions: Fall Recall of Precautions/Restrictions: Intact Restrictions Weight Bearing Restrictions Per Provider Order: No     Mobility Bed Mobility Overal bed mobility: Independent                  Transfers Overall transfer level: Modified independent Equipment used: Straight cane               General transfer comment: SPC used; no physical assist.      Balance Overall balance assessment: Mild deficits observed, not formally tested                                         ADL either performed or assessed with clinical judgement   ADL Overall ADL's : Modified independent                                       General ADL Comments: Use of cane for mobility.      Vision Baseline Vision/History: 1 Wears glasses;4 Cataracts Ability to See in Adequate Light: 1 Impaired Patient Visual Report: No change from baseline (Reports vision getting worse over time, but nothing acute.) Vision Assessment?: No apparent visual deficits (Reports much watering from L eye.)     Perception Perception: Not tested       Praxis Praxis: Not tested       Pertinent Vitals/Pain Pain Assessment Pain Assessment: Faces Faces Pain Scale: Hurts a little bit Pain Location: back and legs Pain Descriptors / Indicators:  (chronic) Pain Intervention(s): Monitored during session, Repositioned     Extremity/Trunk Assessment Upper Extremity Assessment Upper Extremity Assessment: LUE deficits/detail (4+ to 5 grossly bilaterally. History of R side carpal tunnel operation and reportsing needing the same on the L forearm.) LUE Deficits / Details: decreased sensation LUE Sensation: decreased light touch LUE Coordination: WNL   Lower Extremity Assessment Lower Extremity Assessment: Defer to PT evaluation   Cervical / Trunk Assessment Cervical / Trunk Assessment: Normal   Communication Communication Communication: No apparent difficulties   Cognition Arousal: Alert Behavior During Therapy: [facility] for tasks  assessed/performed Cognition: No apparent impairments                               Following commands: Intact       Cueing  General Comments   Cueing Techniques: Verbal cues                 Home Living Family/patient expects to be discharged to:: Private residence Living Arrangements: Alone Available Help at Discharge: Family;Personal care attendant;Available PRN/intermittently Type of Home: Mobile home Home Access: Ramped entrance     Home Layout: One level     Bathroom Shower/Tub: Chief Strategy Officer: Handicapped height (slightly raised) Bathroom Accessibility: Yes How Accessible: Accessible via  wheelchair;Accessible via walker Home Equipment: Shower seat;Grab bars - tub/shower;Cane - quad;Standard Otho Blitz;Hospital bed   Additional Comments: home health aide 7 days a week from 3:45 PM to 11:45 PM. Wears O2 when sleeping.      Prior Functioning/Environment Prior Level of Function : Needs assist;History of Falls (last six months) (reports 12 falls in the past 6 months)       Physical Assist : ADLs (physical)   ADLs (physical): IADLs Mobility Comments: SPC community ambulator ADLs Comments: Independent ADL's ; assist IADL's by home health aide.                            Co-evaluation PT/OT/SLP Co-Evaluation/Treatment: Yes Reason for Co-Treatment: To address functional/ADL transfers   OT goals addressed during session: ADL's and self-care      AM-PAC OT "6 Clicks" Daily Activity     Outcome Measure Help from another person eating meals?: None Help from another person taking care of personal grooming?: None Help from another person toileting, which includes using toliet, bedpan, or urinal?: None Help from another person bathing (including washing, rinsing, drying)?: None Help from another person to put on and taking off regular upper body clothing?: None Help from another person to put on and taking off regular lower body clothing?: None 6 Click Score: 24   End of Session Equipment Utilized During Treatment: Oxygen (Pt was removed from supplemental O2 initially but desaturated to 87% while ambulating. Placed back on 2 LPM supplemental O2 at the end of session.)  Activity Tolerance: Patient tolerated treatment well Patient left: in chair;with call bell/phone within reach  OT Visit Diagnosis: Unsteadiness on feet (R26.81);Other symptoms and signs involving the nervous system (R29.898);History of falling (Z91.81)                Time: 2595-6387 OT Time Calculation (min): 21 min Charges:  OT General Charges $OT Visit: 1 Visit OT Evaluation $OT Eval Low  Complexity: 1 Low  Jerry Aguirre OT, MOT  Thurnell Floss 06/01/2023, 11:03 AM

## 2023-06-01 NOTE — ED Notes (Signed)
 Pt ambulated to the bathroom with standby assist

## 2023-06-01 NOTE — Procedures (Signed)
 Routine EEG Report  Jerry Aguirre is a 58 y.o. male with a history of unresponsiveness who is undergoing an EEG to evaluate for seizures.  Report: This EEG was acquired with electrodes placed according to the International 10-20 electrode system (including Fp1, Fp2, F3, F4, C3, C4, P3, P4, O1, O2, T3, T4, T5, T6, A1, A2, Fz, Cz, Pz). The following electrodes were missing or displaced: none.  The occipital dominant rhythm was 9-10 Hz. This activity is reactive to stimulation. Drowsiness was manifested by background fragmentation; deeper stages of sleep were identified by K complexes and sleep spindles. There was no focal slowing. There were no interictal epileptiform discharges. There were no electrographic seizures identified. Photic stimulation and hyperventilation were not performed.  Impression: This EEG was obtained while awake and asleep and is normal.    Clinical Correlation: Normal EEGs, however, do not rule out epilepsy.  Greg Leaks, MD [facility] [phone]  If 7pm- 7am, please page neurology on call as listed in [facility].

## 2023-06-01 NOTE — ED Notes (Signed)
 Bladder scan revealed roughly 383 mls of urine

## 2023-06-02 DIAGNOSIS — Z794 Long term (current) use of insulin: Secondary | ICD-10-CM | POA: Diagnosis not present

## 2023-06-02 DIAGNOSIS — J449 Chronic obstructive pulmonary disease, unspecified: Secondary | ICD-10-CM | POA: Diagnosis not present

## 2023-06-02 DIAGNOSIS — Z7982 Long term (current) use of aspirin: Secondary | ICD-10-CM | POA: Diagnosis not present

## 2023-06-02 DIAGNOSIS — E119 Type 2 diabetes mellitus without complications: Secondary | ICD-10-CM | POA: Diagnosis not present

## 2023-06-02 DIAGNOSIS — R29810 Facial weakness: Secondary | ICD-10-CM | POA: Diagnosis present

## 2023-06-02 DIAGNOSIS — N400 Enlarged prostate without lower urinary tract symptoms: Secondary | ICD-10-CM | POA: Diagnosis not present

## 2023-06-02 DIAGNOSIS — G2581 Restless legs syndrome: Secondary | ICD-10-CM | POA: Diagnosis not present

## 2023-06-02 DIAGNOSIS — F1490 Cocaine use, unspecified, uncomplicated: Secondary | ICD-10-CM | POA: Diagnosis not present

## 2023-06-02 DIAGNOSIS — K219 Gastro-esophageal reflux disease without esophagitis: Secondary | ICD-10-CM | POA: Diagnosis not present

## 2023-06-02 DIAGNOSIS — I10 Essential (primary) hypertension: Secondary | ICD-10-CM | POA: Diagnosis not present

## 2023-06-02 DIAGNOSIS — R202 Paresthesia of skin: Secondary | ICD-10-CM | POA: Diagnosis not present

## 2023-06-02 DIAGNOSIS — I700 Atherosclerosis of aorta: Secondary | ICD-10-CM | POA: Diagnosis not present

## 2023-06-02 DIAGNOSIS — J9611 Chronic respiratory failure with hypoxia: Secondary | ICD-10-CM | POA: Diagnosis not present

## 2023-06-02 DIAGNOSIS — I639 Cerebral infarction, unspecified: Secondary | ICD-10-CM | POA: Diagnosis not present

## 2023-06-02 LAB — BASIC METABOLIC PANEL WITH GFR
Anion gap: 10 (ref 5–15)
BUN: 17 mg/dL (ref 6–20)
CO2: 27 mmol/L (ref 22–32)
Calcium: 8.9 mg/dL (ref 8.9–10.3)
Chloride: 98 mmol/L (ref 98–111)
Creatinine, Ser: 1.01 mg/dL (ref 0.61–1.24)
GFR, Estimated: >60 mL/min (ref >60)
Glucose, Bld: 96 mg/dL (ref 70–99)
Potassium: 3.2 mmol/L — ABNORMAL LOW (ref 3.5–5.1)
Sodium: 135 mmol/L (ref 135–145)

## 2023-06-02 LAB — CBC
HCT: 43.6 % (ref 39.0–52.0)
Hemoglobin: 14.5 g/dL (ref 13.0–17.0)
MCH: 29.4 pg (ref 26.0–34.0)
MCHC: 33.3 g/dL (ref 30.0–36.0)
MCV: 88.4 fL (ref 80.0–100.0)
Platelets: 266 10*3/uL (ref 150–400)
RBC: 4.93 MIL/uL (ref 4.22–5.81)
RDW: 14.9 % (ref 11.5–15.5)
WBC: 8.2 10*3/uL (ref 4.0–10.5)
nRBC: 0 % (ref 0.0–0.2)

## 2023-06-02 LAB — LIPID PANEL
Cholesterol: 127 mg/dL (ref 0–200)
HDL: 24 mg/dL — ABNORMAL LOW (ref >40)
LDL Cholesterol: 69 mg/dL (ref 0–99)
Total CHOL/HDL Ratio: 5.3 ratio
Triglycerides: 170 mg/dL — ABNORMAL HIGH (ref <150)
VLDL: 34 mg/dL (ref 0–40)

## 2023-06-02 LAB — GLUCOSE, CAPILLARY
Glucose-Capillary: 104 mg/dL — ABNORMAL HIGH (ref 70–99)
Glucose-Capillary: 118 mg/dL — ABNORMAL HIGH (ref 70–99)

## 2023-06-02 MED ORDER — FUROSEMIDE 20 MG PO TABS: 20.0000 mg | ORAL_TABLET | Freq: Every day | ORAL | 0 refills | Status: AC | PRN

## 2023-06-02 MED ORDER — METFORMIN HCL ER 500 MG PO TB24: 500.0000 mg | ORAL_TABLET | Freq: Every day | ORAL | 5 refills | Status: AC

## 2023-06-02 MED ORDER — ALBUTEROL SULFATE HFA 108 (90 BASE) MCG/ACT IN AERS: 2.0000 | INHALATION_SPRAY | RESPIRATORY_TRACT | 5 refills | Status: AC | PRN

## 2023-06-02 MED ORDER — ACETAMINOPHEN 325 MG PO TABS: 650.0000 mg | ORAL_TABLET | Freq: Four times a day (QID) | ORAL | Status: AC | PRN

## 2023-06-02 MED ORDER — ALBUTEROL SULFATE (2.5 MG/3ML) 0.083% IN NEBU: 2.5000 mg | INHALATION_SOLUTION | RESPIRATORY_TRACT | 12 refills | Status: AC | PRN

## 2023-06-02 MED ORDER — POTASSIUM CHLORIDE ER 10 MEQ PO TBCR
10.0000 meq | EXTENDED_RELEASE_TABLET | Freq: Every day | ORAL | 2 refills | Status: AC
Start: 2023-06-02 — End: ?

## 2023-06-02 MED ORDER — TRIAMTERENE-HCTZ 37.5-25 MG PO CAPS: 1.0000 | ORAL_CAPSULE | Freq: Every day | ORAL | 5 refills | Status: DC

## 2023-06-02 MED ORDER — COMPRESSOR/NEBULIZER MISC: 1.0000 [IU] | Freq: Every day | 0 refills | Status: AC | PRN

## 2023-06-02 MED ORDER — ASPIRIN 81 MG PO TBEC: 81.0000 mg | DELAYED_RELEASE_TABLET | Freq: Every day | ORAL | 12 refills | Status: AC

## 2023-06-02 NOTE — Discharge Summary (Addendum)
 Jerry Aguirre, is a 58 y.o. male  DOB 22-Mar-1965  MRN [id number].  Admission date:  05/31/2023  Admitting Physician  Twilla Galea, DO  Discharge Date:  06/02/2023   Primary MD  Tobi Fortes, MD  Recommendations for primary care physician for things to follow:   1)Please note that there has been a few changes to your medications 2)Avoid ibuprofen/Advil/Aleve/Motrin/Goody Powders/Naproxen/BC powders/Meloxicam /Diclofenac/Indomethacin and other Nonsteroidal anti-inflammatory medications as these will make you more likely to bleed and can cause stomach ulcers, can also cause Kidney problems.  3) follow-up with your primary care physician in about a week or so for recheck and reevaluation 4) continue to use your CPAP with oxygen  at night 5)Very Low-salt diet advised---Less than 2 gm of Sodium per day advised----ok to use Mrs DASH salt substitute instead of Salt  Admission Diagnosis  Paresthesias [R20.2] Facial droop [R29.810] Acute ischemic stroke [facility]) [I63.9]   Discharge Diagnosis  Paresthesias [R20.2] Facial droop [R29.810] Acute ischemic stroke (HCC) [I63.9]    Principal Problem:   Acute ischemic stroke (HCC) Active Problems:   COPD  GOLD 3   GERD (gastroesophageal reflux disease)   Type 2 diabetes mellitus without complications (HCC)      Past Medical History:  Diagnosis Date   COPD (chronic obstructive pulmonary disease) (HCC)    Dyspnea    GERD (gastroesophageal reflux disease)    Headache    Hyperlipidemia    Hypertension    Sleep apnea    pt says his "OSA was mild and they said he did not need a CPAP"    Past Surgical History:  Procedure Laterality Date   BIOPSY  12/22/2021   Procedure: BIOPSY;  Surgeon: Urban Garden, MD;  [facility];  Service: Gastroenterology;;   COLONOSCOPY WITH PROPOFOL  N/A 12/22/2021   Procedure: COLONOSCOPY WITH PROPOFOL ;  Surgeon:  Urban Garden, MD;  [facility];  Service: Gastroenterology;  Laterality: N/A;  10:15am, asa 3   ESOPHAGOGASTRODUODENOSCOPY (EGD) WITH PROPOFOL  N/A 12/22/2021   Procedure: ESOPHAGOGASTRODUODENOSCOPY (EGD) WITH PROPOFOL ;  Surgeon: Urban Garden, MD;  [facility]: [facility];  Service: Gastroenterology;  Laterality: N/A;   HERNIA REPAIR Right    inguinal   LUMBAR LAMINECTOMY/DECOMPRESSION MICRODISCECTOMY N/A 04/21/2021   Procedure: LAMINECTOMY, MEDIAL FACETECTOMY LUMBAR FOUR- FIVE;  Surgeon: Van Gelinas, MD;  Location: [facility] OR;  Service: Neurosurgery;  Laterality: N/A;   POLYPECTOMY  12/22/2021   Procedure: POLYPECTOMY;  Surgeon: Umberto Ganong, Bearl Limes, MD;  Location: [facility];  Service: Gastroenterology;;   TONSILLECTOMY     removed as a child     HPI  from the history and physical done on the day of admission:   HPI: Jerry Aguirre is a 58 y.o. male with medical history significant of chronic hypoxic respiratory failure due to COPD, patient quit smoking in April 2025, GERD, history of head head and neck malignancy, HTN, obesity with OSA, HLD who presents to the ED with concerns of left-sided facial droop, gait problems/ataxia, last known normal around 1:30  AM on 05/30/2023.  Patient said he fell out of bed found himself on the floor is not sure how he fell off the bed--?? Syncope,  query seizures.  No prior history of seizures --Additional history obtained from patient's sister Arlina Lair at bedside -No chest pains no palpitations no dizziness - In the ED CTA head and neck without LVO patient has chronic small vessel disease -MRI brain pending - EEG without epileptiform findings - Echo with EF of 55 to 60%, no diastolic dysfunction, no regional wall motion normalities, no mitral stenosis, no aortic stenosis - A1c 6.0 - CBC WNL - Potassium 3.1, sodium 139, creatinine 1.45 (creatinine was 1.36 on January 22, 2023), LFTs not elevated except for T. bili of  1.7    Review of Systems: As mentioned in the history of present illness. All other systems reviewed and are negative.    Hospital Course:     1)Presumed TIA - Vs MRI Negative Stroke Brain MRI without acute stroke CTA head and neck without LVO patient has chronic small vessel disease - EEG without epileptiform findings - Echo with EF of 55 to 60%, no diastolic dysfunction, no regional wall motion normalities, no mitral stenosis, no aortic stenosis - A1c 6.0 -LDL 69, HDL 24, triglycerides 170 and T cholesterol 127, -Telemetry monitored unit no significant arrhythmias -Aspirin  81 mg daily advised -c/n Atorvastatin  -Neurology consult appreciated - PT/OT eval appreciated - 2)HTN---Continue Metoprolol  and Maxzide    3)GERD--continue Protonix    4)BPH--continue Flomax    5)RLS--continue Mirapex    6) COPD with chronic hypoxic respiratory failure--no acute exacerbation at this time - Congratulated on quitting smoking back in April 2025 - Oxygen requirement at baseline - Continue bronchodilators - Hold off on steroids   7) chronic pain syndrome--continue PTA opiates  8)DM2--A1c 6.0 -- Metformin  as ordered  Discharge Condition: stable  Follow UP   Follow-up Information     Tobi Fortes, MD. Schedule an appointment as soon as possible for a visit in 1 week(s).   Specialty: Internal Medicine Why: If symptoms worsen Contact information: [address] [place] [address] [phone]                 Diet and Activity recommendation:  As advised  Discharge Instructions    Discharge Instructions     Call MD for:  difficulty breathing, headache or visual disturbances   Complete by: As directed    Call MD for:  persistant dizziness or light-headedness   Complete by: As directed    Call MD for:  persistant nausea and vomiting   Complete by: As directed    Call MD for:  temperature >100.4   Complete by: As directed    Diet - low sodium heart healthy    Complete by: As directed    Discharge instructions   Complete by: As directed    1)Please note that there has been a few changes to your medications 2)Avoid ibuprofen/Advil/Aleve/Motrin/Goody Powders/Naproxen/BC powders/Meloxicam /Diclofenac/Indomethacin and other Nonsteroidal anti-inflammatory medications as these will make you more likely to bleed and can cause stomach ulcers, can also cause Kidney problems.  3) follow-up with your primary care physician in about a week or so for recheck and reevaluation 4) continue to use your CPAP with oxygen  at night 5)Very Low-salt diet advised---Less than 2 gm of Sodium per day advised----ok to use Mrs DASH salt substitute instead of Salt   For home use only DME Nebulizer machine   Complete by: As directed  Patient needs a nebulizer to treat with the following condition: COPD (chronic obstructive pulmonary disease) (HCC)   Length of Need: Lifetime   Additional equipment included:  Administration kit Filter     Increase activity slowly   Complete by: As directed        Discharge Medications     Allergies as of 06/02/2023       Reactions   Lisinopril  Itching   headaches        Medication List     TAKE these medications    Accu-Chek Guide Test test strip Generic drug: glucose blood TEST EVERY DAY   Accu-Chek Softclix Lancets lancets USE AS DIRECTED TO CHECK BLOOD SUGAR IN THE MORNING, AT NOON, AND AT BEDTIME.   acetaminophen  325 MG tablet Commonly known as: TYLENOL  Take 2 tablets (650 mg total) by mouth every 6 (six) hours as needed for mild pain (pain score 1-3) (or Fever >/= 101).   albuterol  108 (90 Base) MCG/ACT inhaler Commonly known as: VENTOLIN  HFA Inhale 2 puffs into the lungs every 4 (four) hours as needed for wheezing or shortness of breath. What changed: Another medication with the same name was added. Make sure you understand how and when to take each.   albuterol  (2.5 MG/3ML) 0.083% nebulizer solution Commonly  known as: PROVENTIL  Take 3 mLs (2.5 mg total) by nebulization every 2 (two) hours as needed for wheezing or shortness of breath. What changed: You were already taking a medication with the same name, and this prescription was added. Make sure you understand how and when to take each.   aspirin  EC 81 MG tablet Take 1 tablet (81 mg total) by mouth daily with breakfast. Swallow whole. Start taking on: Jun 03, 2023   atorvastatin  80 MG tablet Commonly known as: LIPITOR Take 1 tablet (80 mg total) by mouth daily.   baclofen  10 MG tablet Commonly known as: LIORESAL  Take 1 tablet (10 mg total) by mouth 2 (two) times daily as needed.   Blood Glucose Monitoring Suppl Devi 1 each by Does not apply route in the morning, at noon, and at bedtime. May substitute to any manufacturer covered by patient's insurance.   cetirizine  10 MG tablet Commonly known as: ZYRTEC  Take 1 tablet (10 mg total) by mouth daily.   Compressor/Nebulizer Misc 1 Units by Does not apply route daily as needed (Cough, wheezing or shortness of breath). Nebulizer machine with supplies and tubing   cyclobenzaprine  10 MG tablet Commonly known as: FLEXERIL  Take 1 tablet (10 mg total) by mouth 3 (three) times daily as needed for muscle spasms.   furosemide  20 MG tablet Commonly known as: LASIX  Take 1 tablet (20 mg total) by mouth daily as needed for fluid or edema. TAKE (1) TABLET BY MOUTH   DAILY AS NEEDED FOR FLUID OR EDEMA What changed:  medication strength See the new instructions.   GOLD BOND EX Apply 1 Application topically daily as needed (dry skin).   lactulose  10 GM/15ML solution Commonly known as: CHRONULAC  Take 10 g by mouth at bedtime.   Medical Compression Stockings Misc 2 each by Does not apply route daily.   meloxicam  7.5 MG tablet Commonly known as: MOBIC  Take 1 tablet (7.5 mg total) by mouth 2 (two) times daily.   metFORMIN  500 MG 24 hr tablet Commonly known as: GLUCOPHAGE -XR Take 1 tablet (500  mg total) by mouth daily with breakfast.   metoprolol  succinate 25 MG 24 hr tablet Commonly known as: TOPROL -XL Take 1 tablet (25  mg total) by mouth daily.   ondansetron  4 MG tablet Commonly known as: ZOFRAN  Take 1 tablet (4 mg total) by mouth 2 (two) times daily.   oxyCODONE -acetaminophen  10-325 MG tablet Commonly known as: PERCOCET TAKE 1 TABLET EVERY 4-6 HOURS. MAX 5 PER DAY FOR 30 DAYS What changed: See the new instructions.   OxyCONTIN  10 MG 12 hr tablet Generic drug: oxyCODONE  Take 10 mg by mouth at bedtime.   Ozempic  (1 MG/DOSE) 4 MG/3ML Sopn Generic drug: Semaglutide  (1 MG/DOSE) Inject 1 mg into the skin once a week.   pantoprazole  40 MG tablet Commonly known as: PROTONIX  Take 1 tablet (40 mg total) by mouth 2 (two) times daily.   potassium chloride  10 MEQ tablet Commonly known as: KLOR-CON  Take 1 tablet (10 mEq total) by mouth daily. Take While taking Triamterene - HCTZ   pramipexole  0.75 MG tablet Commonly known as: MIRAPEX  Take 1-2 tablets (0.75-1.5 mg total) by mouth See admin instructions. Take 0.75 mg in the morning and 1.5 mg at night   pregabalin  150 MG capsule Commonly known as: LYRICA  Take 1 capsule (150 mg total) by mouth daily as needed.   Simethicone  125 MG Caps Take 1 capsule (125 mg total) by mouth every 6 (six) hours as needed (bloating).   tamsulosin  0.4 MG Caps capsule Commonly known as: FLOMAX  Take 1 capsule (0.4 mg total) by mouth daily.   triamterene -hydrochlorothiazide  37.5-25 MG capsule Commonly known as: DYAZIDE  Take 1 each (1 capsule total) by mouth daily.   Vitamin D  (Ergocalciferol ) 1.25 MG (50000 UNIT) Caps capsule Commonly known as: DRISDOL  Take 1 capsule (50,000 Units total) by mouth every 7 (seven) days.   zolpidem  10 MG tablet Commonly known as: AMBIEN  Take 10 mg by mouth at bedtime.               Durable Medical Equipment  (From admission, onward)           Start     Ordered   06/02/23 0000  For home use  only DME Nebulizer machine       Question Answer Comment  Patient needs a nebulizer to treat with the following condition COPD (chronic obstructive pulmonary disease) (HCC)   Length of Need Lifetime   Additional equipment included Administration kit   Additional equipment included Filter      06/02/23 1254            Major procedures and Radiology Reports - PLEASE review detailed and final reports for all details, in brief -    MR BRAIN WO CONTRAST Result Date: 06/01/2023 CLINICAL DATA:  Neuro deficit, acute, stroke suspected. EXAM: MRI HEAD WITHOUT CONTRAST TECHNIQUE: Multiplanar, multiecho pulse sequences of the brain and surrounding structures were obtained without intravenous contrast. COMPARISON:  CTA head/neck from earlier today. FINDINGS: Brain: No acute infarction, hemorrhage, hydrocephalus, extra-axial collection or mass lesion. Patchy T2/FLAIR hyperintensity in the white matter, nonspecific but compatible with mild-to-moderate chronic microvascular ischemic change. Vascular: Major arterial flow voids are maintained at the skull base. Skull and upper cervical spine: Normal marrow signal. Sinuses/Orbits: Clear sinuses.  No acute orbital findings. Other: No mastoid effusions. IMPRESSION: No evidence of acute intracranial abnormality. Electronically Signed   By: Stevenson Elbe M.D.   On: 06/01/2023 19:34   EEG adult Result Date: 06/01/2023 Eleni Griffin, MD     06/01/2023  6:03 PM Routine EEG Report Jerry Aguirre is a 58 y.o. male with a history of unresponsiveness who is undergoing an EEG to evaluate for seizures.  Report: This EEG was acquired with electrodes placed according to the International 10-20 electrode system (including Fp1, Fp2, F3, F4, C3, C4, P3, P4, O1, O2, T3, T4, T5, T6, A1, A2, Fz, Cz, Pz). The following electrodes were missing or displaced: none. The occipital dominant rhythm was 9-10 Hz. This activity is reactive to stimulation. Drowsiness was manifested by  background fragmentation; deeper stages of sleep were identified by K complexes and sleep spindles. There was no focal slowing. There were no interictal epileptiform discharges. There were no electrographic seizures identified. Photic stimulation and hyperventilation were not performed. Impression: This EEG was obtained while awake and asleep and is normal.   Clinical Correlation: Normal EEGs, however, do not rule out epilepsy. Greg Leaks, MD [facility] [phone] If 7pm- 7am, please page neurology on call as listed in AMION.   ECHOCARDIOGRAM COMPLETE Result Date: 06/01/2023    ECHOCARDIOGRAM REPORT   Patient Name:   Jerry Aguirre Date of Exam: 06/01/2023 Medical Rec #:  [id number]      Height:       64.0 in Accession #:    [id number]     Weight:       189.0 lb Date of Birth:  October 11, 1965      BSA:          1.910 m Patient Age:    58 years       BP:           121/84 mmHg Patient Gender: M              HR:           65 bpm. Exam Location:  [facility] Procedure: 2D Echo, Cardiac Doppler and Color Doppler (Both Spectral and Color            Flow Doppler were utilized during procedure). Indications:    Stroke l63.9  History:        Patient has prior history of Echocardiogram examinations, most                 recent 02/10/2022. COPD; Risk Factors:Hypertension, Diabetes,                 Dyslipidemia and Current Smoker.  Sonographer:    Denese Finn RCS Referring Phys: [phone] Kallan Merrick IMPRESSIONS  1. Left ventricular ejection fraction, by estimation, is 55 to 60%. The left ventricle has normal function. The left ventricle has no regional wall motion abnormalities. Left ventricular diastolic parameters were normal.  2. Right ventricular systolic function is normal. The right ventricular size is normal. Tricuspid regurgitation signal is inadequate for assessing PA pressure.  3. The mitral valve is normal in structure. No evidence of mitral valve regurgitation. No evidence of mitral stenosis.  4.  The aortic valve is tricuspid. Aortic valve regurgitation is not visualized. No aortic stenosis is present.  5. The inferior vena cava is normal in size with greater than 50% respiratory variability, suggesting right atrial pressure of 3 mmHg. FINDINGS  Left Ventricle: Left ventricular ejection fraction, by estimation, is 55 to 60%. The left ventricle has normal function. The left ventricle has no regional wall motion abnormalities. The left ventricular internal cavity size was normal in size. There is  no left ventricular hypertrophy. Left ventricular diastolic parameters were normal. Right Ventricle: The right ventricular size is normal. Right vetricular wall thickness was not well visualized. Right ventricular systolic function is normal. Tricuspid regurgitation signal is inadequate for assessing PA pressure. Left Atrium: Left atrial size was  normal in size. Right Atrium: Right atrial size was normal in size. Pericardium: There is no evidence of pericardial effusion. Mitral Valve: The mitral valve is normal in structure. No evidence of mitral valve regurgitation. No evidence of mitral valve stenosis. Tricuspid Valve: The tricuspid valve is normal in structure. Tricuspid valve regurgitation is not demonstrated. No evidence of tricuspid stenosis. Aortic Valve: The aortic valve is tricuspid. Aortic valve regurgitation is not visualized. No aortic stenosis is present. Aortic valve mean gradient measures 3.0 mmHg. Aortic valve peak gradient measures 5.9 mmHg. Aortic valve area, by VTI measures 2.37 cm. Pulmonic Valve: The pulmonic valve was not well visualized. Pulmonic valve regurgitation is not visualized. No evidence of pulmonic stenosis. Aorta: The aortic root is normal in size and structure. Venous: The inferior vena cava is normal in size with greater than 50% respiratory variability, suggesting right atrial pressure of 3 mmHg. IAS/Shunts: No atrial level shunt detected by color flow Doppler.  LEFT VENTRICLE  PLAX 2D LVIDd:         4.40 cm   Diastology LVIDs:         2.70 cm   LV e' medial:    7.07 cm/s LV PW:         0.90 cm   LV E/e' medial:  8.9 LV IVS:        0.90 cm   LV e' lateral:   9.14 cm/s LVOT diam:     2.00 cm   LV E/e' lateral: 6.8 LV SV:         60 LV SV Index:   31 LVOT Area:     3.14 cm  RIGHT VENTRICLE RV S prime:     9.90 cm/s TAPSE (M-mode): 2.1 cm LEFT ATRIUM             Index        RIGHT ATRIUM           Index Jerry diam:        3.30 cm 1.73 cm/m   RA Area:     16.40 cm Jerry Vol (A2C):   50.1 ml 26.23 ml/m  RA Volume:   43.30 ml  22.67 ml/m Jerry Vol (A4C):   40.9 ml 21.41 ml/m Jerry Biplane Vol: 45.9 ml 24.03 ml/m  AORTIC VALVE AV Area (Vmax):    2.50 cm AV Area (Vmean):   2.41 cm AV Area (VTI):     2.37 cm AV Vmax:           121.17 cm/s AV Vmean:          79.367 cm/s AV VTI:            0.252 m AV Peak Grad:      5.9 mmHg AV Mean Grad:      3.0 mmHg LVOT Vmax:         96.60 cm/s LVOT Vmean:        60.900 cm/s LVOT VTI:          0.190 m LVOT/AV VTI ratio: 0.76  AORTA Ao Root diam: 3.40 cm MITRAL VALVE MV Area (PHT): 3.72 cm    SHUNTS MV Decel Time: 204 msec    Systemic VTI:  0.19 m MV E velocity: 62.60 cm/s  Systemic Diam: 2.00 cm MV A velocity: 59.90 cm/s MV E/A ratio:  1.05 Armida Lander MD Electronically signed by Armida Lander MD Signature Date/Time: 06/01/2023/2:18:33 PM    Final    CT ANGIO HEAD NECK W WO CM Result Date:  06/01/2023 CLINICAL DATA:  Left-sided facial droop EXAM: CT ANGIOGRAPHY HEAD AND NECK TECHNIQUE: Multidetector CT imaging of the head and neck was performed using the standard protocol during bolus administration of intravenous contrast. Multiplanar CT image reconstructions and MIPs were obtained to evaluate the vascular anatomy. Carotid stenosis measurements (when applicable) are obtained utilizing NASCET criteria, using the distal internal carotid diameter as the denominator. RADIATION DOSE REDUCTION: This exam was performed according to the departmental  dose-optimization program which includes automated exposure control, adjustment of the mA and/or kV according to patient size and/or use of iterative reconstruction technique. CONTRAST:  75mL OMNIPAQUE  IOHEXOL  350 MG/ML SOLN COMPARISON:  07/21/2020 FINDINGS: CT HEAD FINDINGS Brain: There is no mass, hemorrhage or extra-axial collection. The size and configuration of the ventricles and extra-axial CSF spaces are normal. There is hypoattenuation of the white matter, most commonly indicating chronic small vessel disease. Vascular: No hyperdense vessel or unexpected vascular calcification. Skull: The visualized skull base, calvarium and extracranial soft tissues are normal. Sinuses/Orbits: No fluid levels or advanced mucosal thickening of the visualized paranasal sinuses. No mastoid or middle ear effusion. Normal orbits. CTA NECK FINDINGS Skeleton: No acute abnormality or high grade bony spinal canal stenosis. Other neck: Normal pharynx, larynx and major salivary glands. No cervical lymphadenopathy. Unremarkable thyroid  gland. Upper chest: No pneumothorax or pleural effusion. No nodules or masses. Aortic arch: There is calcific atherosclerosis of the aortic arch. Conventional 3 vessel aortic branching pattern. RIGHT carotid system: No dissection, occlusion or aneurysm. Mild atherosclerotic calcification at the carotid bifurcation without hemodynamically significant stenosis. LEFT carotid system: Normal without aneurysm, dissection or stenosis. Vertebral arteries: Right dominant configuration. There is no dissection, occlusion or flow-limiting stenosis to the skull base (V1-V3 segments). CTA HEAD FINDINGS POSTERIOR CIRCULATION: Vertebral arteries are normal. No proximal occlusion of the anterior or inferior cerebellar arteries. Basilar artery is normal. Superior cerebellar arteries are normal. Posterior cerebral arteries are normal. ANTERIOR CIRCULATION: Intracranial internal carotid arteries are normal. Anterior  cerebral arteries are normal. Middle cerebral arteries are normal. Venous sinuses: As permitted by contrast timing, patent. Anatomic variants: None Review of the MIP images confirms the above findings. IMPRESSION: 1. No emergent large vessel occlusion or hemodynamically significant stenosis of the head or neck. 2. Chronic small vessel disease. Aortic Atherosclerosis (ICD10-I70.0). Electronically Signed   By: Juanetta Nordmann M.D.   On: 06/01/2023 01:59   Today   Subjective    Jerry Aguirre today has no new complaints  No fever  Or chills   No Nausea, Vomiting or Diarrhea        Patient has been seen and examined prior to discharge   Objective   Blood pressure (!) 115/101, pulse 77, temperature 98.2 F (36.8 C), temperature source Oral, resp. rate 18, height 5\' 4"  (1.626 m), weight 85.7 kg, SpO2 94%.   Intake/Output Summary (Last 24 hours) at 06/02/2023 1256 Last data filed at 06/02/2023 0900 Gross per 24 hour  Intake 960 ml  Output --  Net 960 ml    Exam Gen:- Awake Alert, no acute distress  HEENT:- [place].AT, No sclera icterus Neck-Supple Neck,No JVD,.  Lungs-  CTAB , good air movement bilaterally CV- S1, S2 normal, regular Abd-  +ve B.Sounds, Abd Soft, No tenderness,    Extremity/Skin:- No  edema,   good pulses Psych-affect is appropriate, oriented x3 Neuro-mostly resolved left-sided facial droop and left-sided sensory deficit deficits of the upper extremity , mostly resolved ataxia/gait disturbance , no new focal deficits, no tremors  Data Review   CBC w Diff:  Lab Results  Component Value Date   WBC 8.2 06/02/2023   HGB 14.5 06/02/2023   HGB 16.3 01/22/2023   HCT 43.6 06/02/2023   HCT 48.7 01/22/2023   PLT 266 06/02/2023   PLT 316 01/22/2023   LYMPHOPCT 24 06/01/2023   MONOPCT 8 06/01/2023   EOSPCT 3 06/01/2023   BASOPCT 1 06/01/2023    CMP:  Lab Results  Component Value Date   NA 135 06/02/2023   NA 140 01/22/2023   K 3.2 (L) 06/02/2023   CL 98 06/02/2023    CO2 27 06/02/2023   BUN 17 06/02/2023   BUN 16 01/22/2023   CREATININE 1.01 06/02/2023   PROT 7.3 06/01/2023   PROT 7.2 01/22/2023   ALBUMIN 3.8 06/01/2023   ALBUMIN 4.5 01/22/2023   BILITOT 1.7 (H) 06/01/2023   BILITOT 0.8 01/22/2023   ALKPHOS 87 06/01/2023   AST 22 06/01/2023   ALT 21 06/01/2023  .  Total Discharge time is about 33 minutes  Colin Dawley M.D on 06/02/2023 at 12:56 PM  Go to [email or web address] -  for contact info  [facility] - Office  [phone]

## 2023-06-02 NOTE — Plan of Care (Signed)
   Problem: Education: Goal: Knowledge of General Education information will improve Description Including pain rating scale, medication(s)/side effects and non-pharmacologic comfort measures Outcome: Progressing

## 2023-06-02 NOTE — Discharge Instructions (Signed)
 1)Please note that there has been a few changes to your medications 2)Avoid ibuprofen/Advil/Aleve/Motrin/Goody Powders/Naproxen/BC powders/Meloxicam /Diclofenac/Indomethacin and other Nonsteroidal anti-inflammatory medications as these will make you more likely to bleed and can cause stomach ulcers, can also cause Kidney problems.  3) follow-up with your primary care physician in about a week or so for recheck and reevaluation 4) continue to use your CPAP with oxygen  at night 5)Very Low-salt diet advised---Less than 2 gm of Sodium per day advised----ok to use Mrs DASH salt substitute instead of Salt

## 2023-06-07 ENCOUNTER — Encounter (HOSPITAL_COMMUNITY): Admitting: Occupational Therapy

## 2023-06-13 ENCOUNTER — Ambulatory Visit (INDEPENDENT_AMBULATORY_CARE_PROVIDER_SITE_OTHER)

## 2023-06-13 DIAGNOSIS — E538 Deficiency of other specified B group vitamins: Secondary | ICD-10-CM | POA: Diagnosis not present

## 2023-06-13 MED ORDER — CYANOCOBALAMIN 1000 MCG/ML IJ SOLN
1000.0000 ug | Freq: Once | INTRAMUSCULAR | Status: AC
  Administered 2023-06-13: 1000 ug via INTRAMUSCULAR

## 2023-06-13 NOTE — Progress Notes (Signed)
 Patient is in office today for a nurse visit for B12 Injection. Patient Injection was given in the  Left deltoid. Patient tolerated injection well.

## 2023-06-14 ENCOUNTER — Encounter (HOSPITAL_COMMUNITY): Admitting: Occupational Therapy

## 2023-06-15 ENCOUNTER — Telehealth: Payer: Self-pay

## 2023-06-15 ENCOUNTER — Ambulatory Visit: Payer: Self-pay | Admitting: Internal Medicine

## 2023-06-15 NOTE — Telephone Encounter (Signed)
 Patient's caregiver called and left a message - they have lost the papers for his lab work -I printed and tried to fax to the [facility] on [address] in [place] - could not get fax to go through - mailed a copy of the order to the patient. thanks

## 2023-06-18 ENCOUNTER — Ambulatory Visit: Admitting: Internal Medicine

## 2023-06-19 LAB — HEPATIC FUNCTION PANEL
AG Ratio: 1.6 (calc) (ref 1.0–2.5)
ALT: 18 U/L (ref 9–46)
AST: 19 U/L (ref 10–35)
Albumin: 4.3 g/dL (ref 3.6–5.1)
Alkaline phosphatase (APISO): 107 U/L (ref 35–144)
Bilirubin, Direct: 0.2 mg/dL (ref 0.0–0.2)
Globulin: 2.7 g/dL (ref 1.9–3.7)
Indirect Bilirubin: 1.2 mg/dL (ref 0.2–1.2)
Total Bilirubin: 1.4 mg/dL — ABNORMAL HIGH (ref 0.2–1.2)
Total Protein: 7 g/dL (ref 6.1–8.1)

## 2023-06-20 ENCOUNTER — Ambulatory Visit

## 2023-06-20 VITALS — BP 116/77 | HR 76 | Ht 64.0 in | Wt 196.0 lb

## 2023-06-20 DIAGNOSIS — I10 Essential (primary) hypertension: Secondary | ICD-10-CM | POA: Diagnosis not present

## 2023-06-20 DIAGNOSIS — E119 Type 2 diabetes mellitus without complications: Secondary | ICD-10-CM

## 2023-06-20 DIAGNOSIS — I639 Cerebral infarction, unspecified: Secondary | ICD-10-CM | POA: Diagnosis not present

## 2023-06-20 DIAGNOSIS — M48061 Spinal stenosis, lumbar region without neurogenic claudication: Secondary | ICD-10-CM | POA: Diagnosis not present

## 2023-06-20 DIAGNOSIS — K219 Gastro-esophageal reflux disease without esophagitis: Secondary | ICD-10-CM | POA: Diagnosis not present

## 2023-06-20 MED ORDER — PANTOPRAZOLE SODIUM 40 MG PO TBEC: 40.0000 mg | DELAYED_RELEASE_TABLET | Freq: Two times a day (BID) | ORAL | 5 refills | Status: AC

## 2023-06-20 MED ORDER — METOPROLOL SUCCINATE ER 25 MG PO TB24: 25.0000 mg | ORAL_TABLET | Freq: Every day | ORAL | 5 refills | Status: DC

## 2023-06-20 MED ORDER — BACLOFEN 10 MG PO TABS: 10.0000 mg | ORAL_TABLET | Freq: Two times a day (BID) | ORAL | 5 refills | Status: AC | PRN

## 2023-06-20 NOTE — Progress Notes (Unsigned)
 Established Patient Office Visit  Subjective   Patient ID: Jerry Aguirre, male    DOB: 11/18/1965  Age: 58 y.o. MRN: [id number]  Chief Complaint  Patient presents with   Hospitalization Follow-up    Pt here for follow-up after strojke, also wondering if he can stop his metformin  for his eye surgery    HPI  Pt was admitted to [facility] 05/31/23-06/02/23 for the following:  1)Presumed TIA - Vs MRI Negative Stroke Brain MRI without acute stroke CTA head and neck without LVO patient has chronic small vessel disease - EEG without epileptiform findings - Echo with EF of 55 to 60%, no diastolic dysfunction, no regional wall motion normalities, no mitral stenosis, no aortic stenosis - A1c 6.0 -LDL 69, HDL 24, triglycerides 170 and T cholesterol 127, -Telemetry monitored unit no significant arrhythmias -Aspirin  81 mg daily advised -c/n Atorvastatin  -Neurology consult appreciated - PT/OT eval appreciated - 2)HTN---Continue Metoprolol  and Maxzide    3)GERD--continue Protonix    4)BPH--continue Flomax    5)RLS--continue Mirapex    6) COPD with chronic hypoxic respiratory failure--no acute exacerbation at this time - Congratulated on quitting smoking back in April 2025 - Oxygen requirement at baseline - Continue bronchodilators - Hold off on steroids   7) chronic pain syndrome--continue PTA opiates   8)DM2--A1c 6.0 -- Metformin  as ordered   Past Medical History:  Diagnosis Date   COPD (chronic obstructive pulmonary disease) (HCC)    Dyspnea    GERD (gastroesophageal reflux disease)    Headache    Hyperlipidemia    Hypertension    Sleep apnea    pt says his "OSA was mild and they said he did not need a CPAP"      ROS    Objective:     BP 116/77   Pulse 76   Ht 5\' 4"  (1.626 m)   Wt 196 lb (88.9 kg)   SpO2 92%   BMI 33.64 kg/m  BP Readings from Last 3 Encounters:  06/20/23 116/77  06/02/23 (!) 115/101  05/14/23 131/80   Wt Readings from Last 3 Encounters:   06/20/23 196 lb (88.9 kg)  05/31/23 189 lb (85.7 kg)  05/14/23 196 lb 1.9 oz (89 kg)      Physical Exam Vitals and nursing note reviewed.  Constitutional:      Appearance: Normal appearance.  HENT:     Head: Normocephalic.     Right Ear: Tympanic membrane, ear canal and external ear normal.     Left Ear: Tympanic membrane, ear canal and external ear normal.     Nose: Nose normal.     Mouth/Throat:     Mouth: Mucous membranes are moist.     Pharynx: Oropharynx is clear.  Eyes:     Extraocular Movements: Extraocular movements intact.     Pupils: Pupils are equal, round, and reactive to light.  Cardiovascular:     Rate and Rhythm: Normal rate and regular rhythm.  Pulmonary:     Effort: Pulmonary effort is normal.     Breath sounds: Normal breath sounds.  Musculoskeletal:     Cervical back: Normal range of motion and neck supple.  Skin:    General: Skin is warm and dry.  Neurological:     Mental Status: He is alert and oriented to person, place, and time.     Cranial Nerves: Cranial nerves 2-12 are intact.     Motor: Motor function is intact.     Gait: Gait is intact.  Psychiatric:  Mood and Affect: Mood normal.        Thought Content: Thought content normal.     No results found for any visits on 06/20/23.  Last CBC Lab Results  Component Value Date   WBC 8.2 06/02/2023   HGB 14.5 06/02/2023   HCT 43.6 06/02/2023   MCV 88.4 06/02/2023   MCH 29.4 06/02/2023   RDW 14.9 06/02/2023   PLT 266 06/02/2023   Last metabolic panel Lab Results  Component Value Date   GLUCOSE 96 06/02/2023   NA 135 06/02/2023   K 3.2 (L) 06/02/2023   CL 98 06/02/2023   CO2 27 06/02/2023   BUN 17 06/02/2023   CREATININE 1.01 06/02/2023   GFRNONAA >60 06/02/2023   CALCIUM  8.9 06/02/2023   PROT 7.0 06/19/2023   ALBUMIN 3.8 06/01/2023   LABGLOB 2.7 01/22/2023   AGRATIO 1.6 12/13/2021   BILITOT 1.4 (H) 06/19/2023   ALKPHOS 87 06/01/2023   AST 19 06/19/2023   ALT 18  06/19/2023   ANIONGAP 10 06/02/2023   Last lipids Lab Results  Component Value Date   CHOL 127 06/02/2023   HDL 24 (L) 06/02/2023   LDLCALC 69 06/02/2023   TRIG 170 (H) 06/02/2023   CHOLHDL 5.3 06/02/2023   Last hemoglobin A1c Lab Results  Component Value Date   HGBA1C 6.0 (H) 06/01/2023   Last vitamin B12 and Folate Lab Results  Component Value Date   VITAMINB12 180 (L) 01/22/2023   FOLATE 15.8 01/22/2023      The ASCVD Risk score (Arnett DK, et al., 2019) failed to calculate for the following reasons:   Risk score cannot be calculated because patient has a medical history suggesting prior/existing ASCVD    Assessment & Plan:   Problem List Items Addressed This Visit       Cardiovascular and Mediastinum   Essential hypertension   Remains adequately controlled on current antihypertensive regimen. No medication changes are indicated today.       Relevant Medications   metoprolol  succinate (TOPROL -XL) 25 MG 24 hr tablet   Acute ischemic stroke (HCC) - Primary   Recent hospital records reviewed and patient reports he is doing well at discharge.  He denies any recurrence of facial drooping or slurred speech.  He has not been taking low-dose aspirin  he left the hospital, therefore I have advised him to pick this up from the pharmacy.  Advised him to get the enteric-coated 81 mg aspirin .  He voiced understanding of this.  Recommend follow-up in 3 months or sooner if needed.      Relevant Medications   metoprolol  succinate (TOPROL -XL) 25 MG 24 hr tablet     Digestive   GERD (gastroesophageal reflux disease)   Relevant Medications   pantoprazole  (PROTONIX ) 40 MG tablet     Endocrine   Type 2 diabetes mellitus without complications (HCC)   Lab Results  Component Value Date   HGBA1C 6.0 (H) 06/01/2023   HGBA1C 6.1 (H) 05/14/2023   HGBA1C 6.5 (H) 10/19/2022   Stable with current medications.  Recommend follow-up in 3 months        Other   Lumbar stenosis    Chronic in nature.  Baclofen  refilled for as needed use.      Relevant Medications   baclofen  (LIORESAL ) 10 MG tablet    No follow-ups on file.    Alison Irvine, FNP

## 2023-06-20 NOTE — H&P (Signed)
 Surgical History & Physical  Patient Name: Jerry Aguirre  DOB: Mar 12, 1965  Surgery: Cataract extraction with intraocular lens implant phacoemulsification; Left Eye Surgeon: [name] MD Surgery Date: 06/25/2023 Pre-Op Date: 06/18/2023  HPI: A 33 Yr. old male patient 1. The patient is here for cataract eval, pt. is now ready for surgery. Pt. complains of difficulty when reading and with distance. Both eyes are affected. The episode is constant. The condition's severity increased since last visit. Distance vision is worse than near. The complaint is associated with blurry vision. This is negatively affecting the patient's quality of life and the patient is unable to function adequately in life with the current level of vision. Pt. had stroke 2 weeks ago, which effected some facial movement, and since OS waters off and on. *MRI without contrast results received this morning*  Medical History:  Arthritis Back, shoulder problems, restless leg syndrome, sleep apnea  Cancer Diabetes High Blood Pressure Lung Problems Stroke  Review of Systems Cardiovascular High Blood Pressure Endocrine Diabetes Musculoskeletal Arthritis Psychiatry Sleep Patterns Respiratory COPD  Social Current some day smoker of Cigarettes   Medication Prednisolone acetate, Ilevro, Moxifloxacin,  Atorvastatin , Ondansetron  HCl, Cetirizine , Baclofen , Oxycodone -acetaminophen , Metoprolol  succinate, Ergocalciferol  (vitamin D2), Metformin  (Glucophage  XR), Oxycodone , Cyclobenzaprine , Furosemide , Triamterene -hydrochlorothiazide , Tamsulosin , Pantoprazole , Ventolin  HFA, Ciprofloxacin-dexamethasone , Pramipexole , Pregabalin , Symbicort , ZTlido , Ozempic , OxyContin   Sx/Procedures Wrist Sx  Drug Allergies  lisinopril    History & Physical: Heent: cataracts NECK: supple without bruits LUNGS: lungs clear to auscultation CV: regular rate and rhythm Abdomen: soft and non-tender  Impression & Plan: Assessment: 1.  NUCLEAR  SCLEROSIS AGE RELATED; Both Eyes (H25.13) 2.  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  BLEPHARITIS; Right Upper Lid, Right Lower Lid, Left Upper Lid, Left Lower Lid (H01.001, H01.002,H01.004,H01.005) 4.  DERMATOCHALASIS, no surgery; Right Upper Lid, Left Upper Lid (H02.831, H02.834) 5.  Pinguecula; Both Eyes (H11.153)  Plan: 1.  Cataract accounts for the patient's decreased vision. This visual impairment is not correctable with a tolerable change in glasses or contact lenses. Cataract surgery with an implantation of a new lens should significantly improve the visual and functional status of the patient. Discussed all risks, benefits, alternatives, and potential complications. Discussed the procedures and recovery. Patient desires to have surgery. A-scan ordered and performed today for intra-ocular lens calculations. The surgery will be performed in order to improve vision for driving, reading, and for eye examinations. Recommend phacoemulsification with intra-ocular lens. Recommend Dextenza  for post-operative pain and inflammation. Left Eye. worse - first. Dilates moderately- Omidria/Lidocaine  by protocol. Malyugin Ring. in room.  2.  Bilateral. Made better when patient is able to wash eyes. There is no finding on exam today to explain the bilateral vision loss. S/P brain MRI and CTA - no signs of any new changes. Monitor.  3.  Blepharitis is present - recommend regular lid cleaning.  4.  Asymptomatic, recommend observation for now. Findings, prognosis and treatment options reviewed.  5.  Observe; Artificial tears as needed for irritation.

## 2023-06-21 ENCOUNTER — Encounter (HOSPITAL_COMMUNITY)
Admission: RE | Admit: 2023-06-21 | Discharge: 2023-06-21 | Disposition: A | Discharge status: Home / Self Care | Source: Ambulatory Visit | Attending: Ophthalmology | Admitting: Ophthalmology

## 2023-06-21 ENCOUNTER — Encounter (HOSPITAL_COMMUNITY): Payer: Self-pay

## 2023-06-21 NOTE — Assessment & Plan Note (Signed)
 Recent hospital records reviewed and patient reports he is doing well at discharge.  He denies any recurrence of facial drooping or slurred speech.  He has not been taking low-dose aspirin  he left the hospital, therefore I have advised him to pick this up from the pharmacy.  Advised him to get the enteric-coated 81 mg aspirin .  He voiced understanding of this.  Recommend follow-up in 3 months or sooner if needed.

## 2023-06-21 NOTE — Assessment & Plan Note (Signed)
 Remains adequately controlled on current antihypertensive regimen.  No medication changes are indicated today.

## 2023-06-21 NOTE — Assessment & Plan Note (Signed)
 Lab Results  Component Value Date   HGBA1C 6.0 (H) 06/01/2023   HGBA1C 6.1 (H) 05/14/2023   HGBA1C 6.5 (H) 10/19/2022   Stable with current medications.  Recommend follow-up in 3 months

## 2023-06-21 NOTE — Assessment & Plan Note (Signed)
 Chronic in nature.  Baclofen  refilled for as needed use.

## 2023-06-25 ENCOUNTER — Ambulatory Visit (HOSPITAL_COMMUNITY): Admitting: Anesthesiology

## 2023-06-25 ENCOUNTER — Encounter (HOSPITAL_COMMUNITY): Payer: Self-pay | Admitting: Ophthalmology

## 2023-06-25 ENCOUNTER — Encounter (HOSPITAL_COMMUNITY)
Admission: RE | Disposition: A | Discharge status: Home / Self Care | Payer: Self-pay | Source: Home / Self Care | Attending: Ophthalmology

## 2023-06-25 ENCOUNTER — Other Ambulatory Visit: Payer: Self-pay

## 2023-06-25 ENCOUNTER — Ambulatory Visit (HOSPITAL_COMMUNITY)
Admission: RE | Admit: 2023-06-25 | Discharge: 2023-06-25 | Disposition: A | Discharge status: Home / Self Care | Attending: Ophthalmology | Admitting: Ophthalmology

## 2023-06-25 DIAGNOSIS — E1136 Type 2 diabetes mellitus with diabetic cataract: Secondary | ICD-10-CM | POA: Diagnosis not present

## 2023-06-25 DIAGNOSIS — I10 Essential (primary) hypertension: Secondary | ICD-10-CM

## 2023-06-25 DIAGNOSIS — Z7951 Long term (current) use of inhaled steroids: Secondary | ICD-10-CM | POA: Diagnosis not present

## 2023-06-25 DIAGNOSIS — H2513 Age-related nuclear cataract, bilateral: Secondary | ICD-10-CM | POA: Insufficient documentation

## 2023-06-25 DIAGNOSIS — R0609 Other forms of dyspnea: Secondary | ICD-10-CM | POA: Diagnosis not present

## 2023-06-25 DIAGNOSIS — H11153 Pinguecula, bilateral: Secondary | ICD-10-CM | POA: Diagnosis not present

## 2023-06-25 DIAGNOSIS — G453 Amaurosis fugax: Secondary | ICD-10-CM | POA: Diagnosis not present

## 2023-06-25 DIAGNOSIS — J449 Chronic obstructive pulmonary disease, unspecified: Secondary | ICD-10-CM | POA: Diagnosis not present

## 2023-06-25 DIAGNOSIS — R519 Headache, unspecified: Secondary | ICD-10-CM | POA: Insufficient documentation

## 2023-06-25 DIAGNOSIS — F17210 Nicotine dependence, cigarettes, uncomplicated: Secondary | ICD-10-CM | POA: Diagnosis not present

## 2023-06-25 DIAGNOSIS — M1990 Unspecified osteoarthritis, unspecified site: Secondary | ICD-10-CM | POA: Diagnosis not present

## 2023-06-25 DIAGNOSIS — H2512 Age-related nuclear cataract, left eye: Secondary | ICD-10-CM

## 2023-06-25 DIAGNOSIS — K219 Gastro-esophageal reflux disease without esophagitis: Secondary | ICD-10-CM | POA: Insufficient documentation

## 2023-06-25 DIAGNOSIS — Z8673 Personal history of transient ischemic attack (TIA), and cerebral infarction without residual deficits: Secondary | ICD-10-CM | POA: Diagnosis not present

## 2023-06-25 DIAGNOSIS — H02831 Dermatochalasis of right upper eyelid: Secondary | ICD-10-CM | POA: Insufficient documentation

## 2023-06-25 DIAGNOSIS — H0100A Unspecified blepharitis right eye, upper and lower eyelids: Secondary | ICD-10-CM | POA: Insufficient documentation

## 2023-06-25 DIAGNOSIS — G2581 Restless legs syndrome: Secondary | ICD-10-CM | POA: Diagnosis not present

## 2023-06-25 DIAGNOSIS — R0602 Shortness of breath: Secondary | ICD-10-CM | POA: Insufficient documentation

## 2023-06-25 DIAGNOSIS — G4730 Sleep apnea, unspecified: Secondary | ICD-10-CM | POA: Diagnosis not present

## 2023-06-25 DIAGNOSIS — E119 Type 2 diabetes mellitus without complications: Secondary | ICD-10-CM

## 2023-06-25 DIAGNOSIS — H0100B Unspecified blepharitis left eye, upper and lower eyelids: Secondary | ICD-10-CM | POA: Diagnosis not present

## 2023-06-25 DIAGNOSIS — H02834 Dermatochalasis of left upper eyelid: Secondary | ICD-10-CM | POA: Insufficient documentation

## 2023-06-25 LAB — GLUCOSE, CAPILLARY: Glucose-Capillary: 98 mg/dL (ref 70–99)

## 2023-06-25 SURGERY — PHACOEMULSIFICATION, CATARACT, WITH IOL INSERTION
Anesthesia: Monitor Anesthesia Care | Site: Eye | Laterality: Left

## 2023-06-25 MED ORDER — LIDOCAINE HCL 3.5 % OP GEL
1.0000 | Freq: Once | OPHTHALMIC | Status: AC
  Administered 2023-06-25: 1 via OPHTHALMIC

## 2023-06-25 MED ORDER — EPINEPHRINE PF 1 MG/ML IJ SOLN
INTRAOCULAR | Status: DC | PRN
  Administered 2023-06-25: 1 mL via OPHTHALMIC

## 2023-06-25 MED ORDER — MIDAZOLAM HCL 5 MG/5ML IJ SOLN
INTRAMUSCULAR | Status: DC | PRN
  Administered 2023-06-25: 1 mg via INTRAVENOUS

## 2023-06-25 MED ORDER — SODIUM HYALURONATE 10 MG/ML IO SOLUTION
PREFILLED_SYRINGE | INTRAOCULAR | Status: DC | PRN
  Administered 2023-06-25: .85 mL via INTRAOCULAR

## 2023-06-25 MED ORDER — TETRACAINE HCL 0.5 % OP SOLN
1.0000 [drp] | OPHTHALMIC | Status: AC | PRN
  Administered 2023-06-25 (×3): 1 [drp] via OPHTHALMIC

## 2023-06-25 MED ORDER — MOXIFLOXACIN HCL 5 MG/ML IO SOLN
INTRAOCULAR | Status: DC | PRN
  Administered 2023-06-25: .3 mL via INTRACAMERAL

## 2023-06-25 MED ORDER — MIDAZOLAM HCL 2 MG/2ML IJ SOLN
INTRAMUSCULAR | Status: AC
Start: 2023-06-25 — End: ?
  Filled 2023-06-25: qty 2

## 2023-06-25 MED ORDER — LACTATED RINGERS IV SOLN: INTRAVENOUS | Status: DC

## 2023-06-25 MED ORDER — PHENYLEPHRINE HCL 2.5 % OP SOLN
1.0000 [drp] | OPHTHALMIC | Status: AC | PRN
  Administered 2023-06-25 (×3): 1 [drp] via OPHTHALMIC

## 2023-06-25 MED ORDER — PHENYLEPHRINE-KETOROLAC 1-0.3 % IO SOLN
INTRAOCULAR | Status: DC | PRN
  Administered 2023-06-25: 500 mL via OPHTHALMIC

## 2023-06-25 MED ORDER — POVIDONE-IODINE 5 % OP SOLN
OPHTHALMIC | Status: DC | PRN
  Administered 2023-06-25: 1 via OPHTHALMIC

## 2023-06-25 MED ORDER — STERILE WATER FOR IRRIGATION IR SOLN
Status: DC | PRN
  Administered 2023-06-25: 25 mL

## 2023-06-25 MED ORDER — TROPICAMIDE 1 % OP SOLN
1.0000 [drp] | OPHTHALMIC | Status: AC | PRN
  Administered 2023-06-25 (×3): 1 [drp] via OPHTHALMIC

## 2023-06-25 MED ORDER — SODIUM HYALURONATE 23MG/ML IO SOSY
PREFILLED_SYRINGE | INTRAOCULAR | Status: DC | PRN
  Administered 2023-06-25: .6 mL via INTRAOCULAR

## 2023-06-25 MED ORDER — BSS IO SOLN
INTRAOCULAR | Status: DC | PRN
  Administered 2023-06-25: 15 mL via INTRAOCULAR

## 2023-06-25 SURGICAL SUPPLY — 13 items
CATARACT SUITE SIGHTPATH (MISCELLANEOUS) ×1 IMPLANT
CLOTH BEACON ORANGE TIMEOUT ST (SAFETY) ×1 IMPLANT
EYE SHIELD UNIVERSAL CLEAR (GAUZE/BANDAGES/DRESSINGS) IMPLANT
FEE CATARACT SUITE SIGHTPATH (MISCELLANEOUS) ×1 IMPLANT
GLOVE BIOGEL PI IND STRL 6.5 (GLOVE) IMPLANT
GLOVE BIOGEL PI IND STRL 7.0 (GLOVE) ×2 IMPLANT
LENS IOL TECNIS EYHANCE 22.5 (Intraocular Lens) IMPLANT
NDL HYPO 18GX1.5 BLUNT FILL (NEEDLE) ×1 IMPLANT
NEEDLE HYPO 18GX1.5 BLUNT FILL (NEEDLE) ×1 IMPLANT
PAD ARMBOARD POSITIONER FOAM (MISCELLANEOUS) ×1 IMPLANT
SYR TB 1ML LL NO SAFETY (SYRINGE) ×1 IMPLANT
TAPE SURG TRANSPORE 1 IN (GAUZE/BANDAGES/DRESSINGS) IMPLANT
WATER STERILE IRR 250ML POUR (IV SOLUTION) ×1 IMPLANT

## 2023-06-25 NOTE — Discharge Instructions (Addendum)
 Please discharge patient when stable, will follow up today with Dr. June Leap at the [facility] office immediately following discharge.  Leave shield in place until visit.  All paperwork with discharge instructions will be given at the office.  [facility] Address:  [address]  [place], [place] [address]

## 2023-06-25 NOTE — Op Note (Signed)
 Date of procedure: 06/25/23  Pre-operative diagnosis: Visually significant age-related nuclear cataract, Left Eye (H25.12)  Post-operative diagnosis: Visually significant age-related nuclear cataract, Left Eye  Procedure: Removal of cataract via phacoemulsification and insertion of intra-ocular lens Lincoln Renshaw and Johnson DIB00 +22.5D into the capsular bag of the Left Eye  Attending surgeon: Pleas Brill. Legacy Lacivita, MD, MA  Anesthesia: MAC, Topical Akten  Complications: None  Estimated Blood Loss: <74mL (minimal)  Specimens: None  Implants: As above  Indications:  Visually significant age-related cataract, Left Eye  Procedure:  The patient was seen and identified in the pre-operative area. The operative eye was identified and dilated.  The operative eye was marked.  Topical anesthesia was administered to the operative eye.     The patient was then to the operative suite and placed in the supine position.  A timeout was performed confirming the patient, procedure to be performed, and all other relevant information.   The patient's face was prepped and draped in the usual fashion for intra-ocular surgery.  A lid speculum was placed into the operative eye and the surgical microscope moved into place and focused.  An inferotemporal paracentesis was created using a 20 gauge paracentesis blade. Omidria was injected into the anterior chamber. Shugarcaine was injected into the anterior chamber.  Viscoelastic was injected into the anterior chamber.  A temporal clear-corneal main wound incision was created using a 2.52mm microkeratome.  A continuous curvilinear capsulorrhexis was initiated using an irrigating cystitome and completed using capsulorrhexis forceps.  Hydrodissection and hydrodeliniation were performed.  Viscoelastic was injected into the anterior chamber.  A phacoemulsification handpiece and a chopper as a second instrument were used to remove the nucleus and epinucleus. The irrigation/aspiration  handpiece was used to remove any remaining cortical material.   The capsular bag was reinflated with viscoelastic, checked, and found to be intact.  The intraocular lens was inserted into the capsular bag.  The irrigation/aspiration handpiece was used to remove any remaining viscoelastic.  The clear corneal wound and paracentesis wounds were then hydrated and checked with Weck-Cels to be watertight. 0.1mL of moxifloxacin was injected into the anterior chamber. The lid-speculum and drape was removed, and the patient's face was cleaned with a wet and dry 4x4. A clear shield was taped over the eye. The patient was taken to the post-operative care unit in good condition, having tolerated the procedure well.  Post-Op Instructions: The patient will follow up at [facility] for a same day post-operative evaluation and will receive all other orders and instructions.

## 2023-06-25 NOTE — Interval H&P Note (Signed)
 History and Physical Interval Note:  06/25/2023 2:40 PM  Jerry Aguirre  has presented today for surgery, with the diagnosis of nuclear sclerotic cataract, left eye.  The various methods of treatment have been discussed with the patient and family. After consideration of risks, benefits and other options for treatment, the patient has consented to  Procedure(s) with comments: PHACOEMULSIFICATION, CATARACT, WITH IOL INSERTION (Left) - CDE: as a surgical intervention.  The patient's history has been reviewed, patient examined, no change in status, stable for surgery.  I have reviewed the patient's chart and labs.  Questions were answered to the patient's satisfaction.     [name]

## 2023-06-25 NOTE — Transfer of Care (Addendum)
 Immediate Anesthesia Transfer of Care Note  Patient: Jerry Aguirre  Procedure(s) Performed: PHACOEMULSIFICATION, CATARACT, WITH IOL INSERTION (Left: Eye)  Patient Location: Short Stay  Anesthesia Type:MAC  Level of Consciousness: awake, alert , and oriented  Airway & Oxygen Therapy: Patient Spontanous Breathing  Post-op Assessment: Report given to RN and Post -op Vital signs reviewed and stable  Post vital signs: Reviewed and stable  Last Vitals:  Vitals Value Taken Time  BP    Temp    Pulse    Resp    SpO2      Last Pain:  Vitals:   06/25/23 1410  TempSrc: Oral  PainSc: 10-Worst pain ever      Patients Stated Pain Goal: 10 (06/25/23 1410)  Complications: No notable events documented.

## 2023-06-25 NOTE — Anesthesia Postprocedure Evaluation (Signed)
 Anesthesia Post Note  Patient: Jerry Aguirre  Procedure(s) Performed: PHACOEMULSIFICATION, CATARACT, WITH IOL INSERTION (Left: Eye)  Patient location during evaluation: PACU Anesthesia Type: MAC Level of consciousness: awake and alert Pain management: pain level controlled Vital Signs Assessment: post-procedure vital signs reviewed and stable Respiratory status: spontaneous breathing, nonlabored ventilation and respiratory function stable Cardiovascular status: stable and blood pressure returned to baseline Postop Assessment: no apparent nausea or vomiting Anesthetic complications: no   There were no known notable events for this encounter.   Last Vitals:  Vitals:   06/25/23 1410 06/25/23 1504  BP: 123/86 129/88  Pulse: 72 75  Resp: 18 18  Temp: 36.6 C 36.6 C  SpO2: 96% 98%    Last Pain:  Vitals:   06/25/23 1504  TempSrc: Oral  PainSc: 0-No pain                 [name]

## 2023-06-25 NOTE — Anesthesia Preprocedure Evaluation (Signed)
 Anesthesia Evaluation  Patient identified by MRN, date of birth, ID band Patient awake    Reviewed: Allergy & Precautions, H&P , NPO status , Patient's Chart, lab work & pertinent test results  Airway Mallampati: II  TM Distance: >3 FB Neck ROM: Full    Dental  (+) Edentulous Upper, Edentulous Lower   Pulmonary shortness of breath and with exertion, sleep apnea (mild) , COPD,  COPD inhaler, former smoker   Pulmonary exam normal  + wheezing      Cardiovascular hypertension, Pt. on medications + DOE  Normal cardiovascular exam Rhythm:Regular Rate:Normal     Neuro/Psych  Headaches  Neuromuscular disease CVA  negative psych ROS   GI/Hepatic Neg liver ROS,GERD  Medicated and Poorly Controlled,,  Endo/Other  negative endocrine ROS    Renal/GU negative Renal ROS  negative genitourinary   Musculoskeletal negative musculoskeletal ROS (+)    Abdominal   Peds negative pediatric ROS (+)  Hematology negative hematology ROS (+)   Anesthesia Other Findings   Reproductive/Obstetrics negative OB ROS                             Anesthesia Physical Anesthesia Plan  ASA: 3  Anesthesia Plan: MAC   Post-op Pain Management: Minimal or no pain anticipated   Induction:   PONV Risk Score and Plan:   Airway Management Planned: Nasal Cannula and Natural Airway  Additional Equipment: None  Intra-op Plan:   Post-operative Plan:   Informed Consent: I have reviewed the patients History and Physical, chart, labs and discussed the procedure including the risks, benefits and alternatives for the proposed anesthesia with the patient or authorized representative who has indicated his/her understanding and acceptance.     Dental advisory given  Plan Discussed with: CRNA  Anesthesia Plan Comments:         Anesthesia Quick Evaluation

## 2023-06-26 ENCOUNTER — Encounter (HOSPITAL_COMMUNITY): Payer: Self-pay | Admitting: Ophthalmology

## 2023-06-27 ENCOUNTER — Ambulatory Visit (INDEPENDENT_AMBULATORY_CARE_PROVIDER_SITE_OTHER): Admitting: Podiatry

## 2023-06-27 DIAGNOSIS — B351 Tinea unguium: Secondary | ICD-10-CM | POA: Diagnosis not present

## 2023-06-27 DIAGNOSIS — L209 Atopic dermatitis, unspecified: Secondary | ICD-10-CM | POA: Diagnosis not present

## 2023-06-27 DIAGNOSIS — L600 Ingrowing nail: Secondary | ICD-10-CM | POA: Diagnosis not present

## 2023-06-27 DIAGNOSIS — M79672 Pain in left foot: Secondary | ICD-10-CM

## 2023-06-27 MED ORDER — TERBINAFINE HCL 250 MG PO TABS: 250.0000 mg | ORAL_TABLET | Freq: Every day | ORAL | 0 refills | Status: DC

## 2023-06-27 MED ORDER — TERBINAFINE HCL 250 MG PO TABS: 250.0000 mg | ORAL_TABLET | Freq: Every day | ORAL | Status: DC

## 2023-06-27 MED ORDER — CLOBETASOL PROPIONATE 0.05 % EX OINT: 1.0000 | TOPICAL_OINTMENT | Freq: Two times a day (BID) | CUTANEOUS | 1 refills | Status: DC

## 2023-06-27 NOTE — Progress Notes (Signed)
   Subjective:    HPI Presents for follow-up onychomycosis treatment with p.o. Lamisil.  No problems taking medicine with no side effects noted.  Tenderness around toes with walking and wearing shoes.  Did not get the second Lamisil prescription filled due to communication error.   Objective:  Physical Exam   General: AAO x3, NAD  Vascular: DP and PT pulses palpable bilaterally.  Immedate capillary fill time digits. No significant lower extremity edema bilaterally.  Dermatological: Onychomycotic mycotic changes nails 1 through 5 with discoloration nail and subungual debris and thickening of the nail. 0% Clearance of onychomycotic nail changes noted. Tenderness with walking and wearing shoes.  Erythematous rash mid arch plantar feet bilaterally  Neruologic: Grossly intact B/L  Musculoskeletal:   Assessment:  Painful onychomycotic nails 1 through 5 bilaterally. Pain feet b/l 3.  Atopic dermatitis bilaterally plantar feet 4.  Ingrown nails feet bilaterally    Plan:  -Established office visit level 3 for evaluation and management -Patient is tolerating Lamisil treatment for onychomycotic nails well.  No side effects noted. Will continue this treatment. -Rx: Lamisil 250 mg p.o. daily.  This will be his second Rx -Rx clobetasol ointment 0.5%, apply to affected area feet twice daily.  Refill x 3 - Labs ordered today for liver function tests to monitor for any hepatic side effects from the Lamisil.  - Return in 4 weeks for  Lamisil 3

## 2023-06-28 ENCOUNTER — Other Ambulatory Visit: Payer: Self-pay | Admitting: Internal Medicine

## 2023-06-28 DIAGNOSIS — E559 Vitamin D deficiency, unspecified: Secondary | ICD-10-CM

## 2023-07-04 ENCOUNTER — Encounter (HOSPITAL_COMMUNITY)
Admission: RE | Admit: 2023-07-04 | Discharge: 2023-07-04 | Disposition: A | Discharge status: Home / Self Care | Source: Ambulatory Visit | Attending: Ophthalmology | Admitting: Ophthalmology

## 2023-07-04 NOTE — Pre-Procedure Instructions (Signed)
 Attempted pre-op phone call. VM is not set up.Could not leave a message.

## 2023-07-05 ENCOUNTER — Encounter (HOSPITAL_COMMUNITY): Payer: Self-pay

## 2023-07-05 NOTE — H&P (Signed)
 Surgical History & Physical  Patient Name: Jerry Aguirre  DOB: [birth year]  Surgery: Cataract extraction with intraocular lens implant phacoemulsification; Right Eye Surgeon: [name] MD Surgery Date: 07/09/2023 Pre-Op Date: 06/28/2023  HPI: A 89 Yr. old male patient Returns for 3 day PO OS, preop OD. LEE 06-25-23 with Dr. Adora Hoover. Vision is improving overall OS. Today having FBS OS, pain that got better with drops. Had some flashes yesterday but not seeing anymore. Taking Moxi tid OS, Pred tid OS. Denies new floaters, diplopia. C/O difficulty OD with near activities like reading labels, signing forms, and distance like seeing signs, driving with glare, daytime glare, seeing captions on TV, poor night [place], hazy/blurry [place]. This is negatively affecting the patient's quality of life and the patient is unable to function adequately in life with the current level of vision. HPI was performed by [name] .  Medical History:  Arthritis Back, shoulder problems, restless leg syndrome, sleep apnea Cancer Diabetes High Blood Pressure Lung Problems Stroke  Review of Systems Cardiovascular High Blood Pressure Endocrine Diabetes Musculoskeletal Arthritis Psychiatry Sleep Patterns Respiratory COPD  Social Current some day smoker of Cigarettes   Medication Ocular Systemic Prednisolone acetate, Moxifloxacin ,  Atorvastatin , Ondansetron  HCl, Cetirizine , Baclofen , Oxycodone -acetaminophen , Metoprolol  succinate, Ergocalciferol  (vitamin D2), Metformin  (Glucophage  XR), Oxycodone , Cyclobenzaprine , Furosemide , Triamterene -hydrochlorothiazid, Tamsulosin , Pantoprazole , Ventolin  HFA, Ciprofloxacin-dexamethasone , Pramipexole , Pregabalin , Symbicort , ZTlido , Ozempic , OxyContin   Sx/Procedure  Phaco c IOL OS,  Wrist Sx  Drug Allergies  lisinopril    History & Physical: Heent: cataract NECK: supple without bruits LUNGS: lungs clear to auscultation CV: regular rate and rhythm Abdomen: soft and  non-tender  Impression & Plan: Assessment: 1.  NUCLEAR SCLEROSIS AGE RELATED; Right Eye (H25.11) 2.  CATARACT EXTRACTION STATUS; Left Eye (Z98.42)  Plan: 1.  Cataract accounts for the patient's decreased vision. This visual impairment is not correctable with a tolerable change in glasses or contact lenses. Cataract surgery with an implantation of a new lens should significantly improve the visual and functional status of the patient.Discussed all risks, benefits, alternatives, and potential complications. Discussed the procedures and recovery. Patient desires to have surgery. A-scan ordered and performed today for intra-ocular lens calculations. The surgery will be performed in order to improve vision for driving, reading, and for eye examinations. Recommend phacoemulsification with intra-ocular lens. Recommend Dextenza  for post-operative pain and inflammation. History of refractive Surgery: None Use of Eye Pressure Lowering Drops: None Right Eye. Surgery required to correct imbalance of vision. Dilates well - shugarcaine or Lidocaine +Omidira by protocol  2.  3 days since surgery. doing well with improved vision and normal IOP. Continue Moxifloxacin  1 drop TID for 4 more days. Continue Pred Acetate 1 drop TID for 4 days, then 2x/day for 3 weeks.

## 2023-07-09 ENCOUNTER — Ambulatory Visit (HOSPITAL_COMMUNITY): Payer: Self-pay | Admitting: Anesthesiology

## 2023-07-09 ENCOUNTER — Encounter (HOSPITAL_COMMUNITY): Payer: Self-pay | Admitting: Ophthalmology

## 2023-07-09 ENCOUNTER — Encounter (HOSPITAL_COMMUNITY)
Admission: RE | Disposition: A | Discharge status: Home / Self Care | Payer: Self-pay | Source: Home / Self Care | Attending: Ophthalmology

## 2023-07-09 ENCOUNTER — Ambulatory Visit (HOSPITAL_COMMUNITY)
Admission: RE | Admit: 2023-07-09 | Discharge: 2023-07-09 | Disposition: A | Discharge status: Home / Self Care | Attending: Ophthalmology | Admitting: Ophthalmology

## 2023-07-09 ENCOUNTER — Other Ambulatory Visit: Payer: Self-pay

## 2023-07-09 DIAGNOSIS — Z87891 Personal history of nicotine dependence: Secondary | ICD-10-CM

## 2023-07-09 DIAGNOSIS — G4730 Sleep apnea, unspecified: Secondary | ICD-10-CM | POA: Diagnosis not present

## 2023-07-09 DIAGNOSIS — K219 Gastro-esophageal reflux disease without esophagitis: Secondary | ICD-10-CM | POA: Diagnosis not present

## 2023-07-09 DIAGNOSIS — I10 Essential (primary) hypertension: Secondary | ICD-10-CM

## 2023-07-09 DIAGNOSIS — J449 Chronic obstructive pulmonary disease, unspecified: Secondary | ICD-10-CM | POA: Diagnosis not present

## 2023-07-09 DIAGNOSIS — H2511 Age-related nuclear cataract, right eye: Secondary | ICD-10-CM

## 2023-07-09 DIAGNOSIS — F17210 Nicotine dependence, cigarettes, uncomplicated: Secondary | ICD-10-CM | POA: Diagnosis not present

## 2023-07-09 DIAGNOSIS — E1136 Type 2 diabetes mellitus with diabetic cataract: Secondary | ICD-10-CM | POA: Insufficient documentation

## 2023-07-09 SURGERY — PHACOEMULSIFICATION, CATARACT, WITH IOL INSERTION
Anesthesia: Monitor Anesthesia Care | Site: Eye | Laterality: Right

## 2023-07-09 MED ORDER — MOXIFLOXACIN HCL 5 MG/ML IO SOLN
INTRAOCULAR | Status: DC | PRN
  Administered 2023-07-09: .3 mL via INTRACAMERAL

## 2023-07-09 MED ORDER — SODIUM CHLORIDE 0.9% FLUSH
INTRAVENOUS | Status: DC | PRN
  Administered 2023-07-09: 5 mL via INTRAVENOUS

## 2023-07-09 MED ORDER — TETRACAINE HCL 0.5 % OP SOLN
1.0000 [drp] | OPHTHALMIC | Status: AC | PRN
  Administered 2023-07-09 (×3): 1 [drp] via OPHTHALMIC

## 2023-07-09 MED ORDER — POVIDONE-IODINE 5 % OP SOLN
OPHTHALMIC | Status: DC | PRN
  Administered 2023-07-09: 1 via OPHTHALMIC

## 2023-07-09 MED ORDER — MIDAZOLAM HCL 2 MG/2ML IJ SOLN
INTRAMUSCULAR | Status: DC | PRN
  Administered 2023-07-09: 1 mg via INTRAVENOUS

## 2023-07-09 MED ORDER — LIDOCAINE HCL 3.5 % OP GEL
1.0000 | Freq: Once | OPHTHALMIC | Status: AC
  Administered 2023-07-09: 1 via OPHTHALMIC

## 2023-07-09 MED ORDER — PHENYLEPHRINE HCL 2.5 % OP SOLN
1.0000 [drp] | OPHTHALMIC | Status: AC | PRN
  Administered 2023-07-09 (×3): 1 [drp] via OPHTHALMIC

## 2023-07-09 MED ORDER — LACTATED RINGERS IV SOLN: INTRAVENOUS | Status: DC

## 2023-07-09 MED ORDER — TROPICAMIDE 1 % OP SOLN
1.0000 [drp] | OPHTHALMIC | Status: AC | PRN
  Administered 2023-07-09 (×3): 1 [drp] via OPHTHALMIC

## 2023-07-09 MED ORDER — PHENYLEPHRINE-KETOROLAC 1-0.3 % IO SOLN
INTRAOCULAR | Status: DC | PRN
  Administered 2023-07-09: 500 mL via OPHTHALMIC

## 2023-07-09 MED ORDER — NA HYALUR & NA CHOND-NA HYALUR 0.55-0.5 ML IO KIT
PACK | INTRAOCULAR | Status: DC | PRN
  Administered 2023-07-09: 1 via INTRAOCULAR

## 2023-07-09 MED ORDER — MIDAZOLAM HCL 2 MG/2ML IJ SOLN
INTRAMUSCULAR | Status: AC
Start: 2023-07-09 — End: 2023-07-09
  Filled 2023-07-09: qty 2

## 2023-07-09 MED ORDER — BSS IO SOLN
INTRAOCULAR | Status: DC | PRN
  Administered 2023-07-09: 15 mL via INTRAOCULAR

## 2023-07-09 MED ORDER — STERILE WATER FOR IRRIGATION IR SOLN
Status: DC | PRN
  Administered 2023-07-09: 250 mL

## 2023-07-09 SURGICAL SUPPLY — 12 items
CATARACT SUITE SIGHTPATH (MISCELLANEOUS) ×1 IMPLANT
CLOTH BEACON ORANGE TIMEOUT ST (SAFETY) ×1 IMPLANT
EYE SHIELD UNIVERSAL CLEAR (GAUZE/BANDAGES/DRESSINGS) IMPLANT
FEE CATARACT SUITE SIGHTPATH (MISCELLANEOUS) ×1 IMPLANT
GLOVE BIOGEL PI IND STRL 7.0 (GLOVE) ×2 IMPLANT
LENS IOL TECNIS EYHANCE 22.5 (Intraocular Lens) IMPLANT
NDL HYPO 18GX1.5 BLUNT FILL (NEEDLE) ×1 IMPLANT
NEEDLE HYPO 18GX1.5 BLUNT FILL (NEEDLE) ×1 IMPLANT
PAD ARMBOARD POSITIONER FOAM (MISCELLANEOUS) ×1 IMPLANT
SYR TB 1ML LL NO SAFETY (SYRINGE) ×1 IMPLANT
TAPE SURG TRANSPORE 1 IN (GAUZE/BANDAGES/DRESSINGS) IMPLANT
WATER STERILE IRR 250ML POUR (IV SOLUTION) ×1 IMPLANT

## 2023-07-09 NOTE — Op Note (Signed)
 Date of procedure: 07/09/23  Pre-operative diagnosis: Visually significant age-related nuclear cataract, Right Eye (H25.11)  Post-operative diagnosis: Visually significant age-related nuclear cataract, Right Eye  Procedure: Removal of cataract via phacoemulsification and insertion of intra-ocular lens Vicci and Johnson DIB00 +22.5D into the capsular bag of the Right Eye  Attending surgeon: Lynwood LABOR. Jacobo Moncrief, MD, MA  Anesthesia: MAC, Topical Akten   Complications: None  Estimated Blood Loss: <21mL (minimal)  Specimens: None  Implants: As above  Indications:  Visually significant age-related cataract, Right Eye  Procedure:  The patient was seen and identified in the pre-operative area. The operative eye was identified and dilated.  The operative eye was marked.  Topical anesthesia was administered to the operative eye.     The patient was then to the operative suite and placed in the supine position.  A timeout was performed confirming the patient, procedure to be performed, and all other relevant information.   The patient's face was prepped and draped in the usual fashion for intra-ocular surgery.  A lid speculum was placed into the operative eye and the surgical microscope moved into place and focused.  A superotemporal paracentesis was created using a 20 gauge paracentesis blade. Omidria  was injected into the anterior chamber. Shugarcaine was injected into the anterior chamber.  Viscoelastic was injected into the anterior chamber.  A temporal clear-corneal main wound incision was created using a 2.55mm microkeratome.  A continuous curvilinear capsulorrhexis was initiated using an irrigating cystitome and completed using capsulorrhexis forceps.  Hydrodissection and hydrodeliniation were performed.  Viscoelastic was injected into the anterior chamber.  A phacoemulsification handpiece and a chopper as a second instrument were used to remove the nucleus and epinucleus. The irrigation/aspiration  handpiece was used to remove any remaining cortical material.   The capsular bag was reinflated with viscoelastic, checked, and found to be intact.  The intraocular lens was inserted into the capsular bag.  The irrigation/aspiration handpiece was used to remove any remaining viscoelastic.  The clear corneal wound and paracentesis wounds were then hydrated and checked with Weck-Cels to be watertight. 0.1mL of moxifloxacin  was injected into the anterior chamber. The lid-speculum and drape was removed, and the patient's face was cleaned with a wet and dry 4x4.  A clear shield was taped over the eye. The patient was taken to the post-operative care unit in good condition, having tolerated the procedure well.  Post-Op Instructions: The patient will follow up at [facility] for a same day post-operative evaluation and will receive all other orders and instructions.

## 2023-07-09 NOTE — Interval H&P Note (Signed)
 History and Physical Interval Note:  07/09/2023 9:36 AM  Jerry Aguirre  has presented today for surgery, with the diagnosis of nuclear sclerotic cataract, right eye.  The various methods of treatment have been discussed with the patient and family. After consideration of risks, benefits and other options for treatment, the patient has consented to  Procedure(s) with comments: PHACOEMULSIFICATION, CATARACT, WITH IOL INSERTION (Right) - CDE: as a surgical intervention.  The patient's history has been reviewed, patient examined, no change in status, stable for surgery.  I have reviewed the patient's chart and labs.  Questions were answered to the patient's satisfaction.     HARRIE AGENT

## 2023-07-09 NOTE — Anesthesia Postprocedure Evaluation (Signed)
 Anesthesia Post Note  Patient: Jerry Aguirre  Procedure(s) Performed: PHACOEMULSIFICATION, CATARACT, WITH IOL INSERTION (Right: Eye)  Patient location during evaluation: Phase II Anesthesia Type: MAC Level of consciousness: awake Pain management: pain level controlled Vital Signs Assessment: post-procedure vital signs reviewed and stable Respiratory status: spontaneous breathing and respiratory function stable Cardiovascular status: blood pressure returned to baseline and stable Postop Assessment: no headache and no apparent nausea or vomiting Anesthetic complications: no Comments: Late entry   No notable events documented.   Last Vitals:  Vitals:   07/09/23 0845 07/09/23 0959  BP: 109/74 117/68  Pulse: (!) 57 (!) 57  Resp: 20 18  Temp: 36.7 C (!) 36.4 C  SpO2: 95% 98%    Last Pain:  Vitals:   07/09/23 1001  TempSrc:   PainSc: 0-No pain                 Yvonna JINNY Bosworth

## 2023-07-09 NOTE — Transfer of Care (Signed)
 Immediate Anesthesia Transfer of Care Note  Patient: Jerry Aguirre  Procedure(s) Performed: PHACOEMULSIFICATION, CATARACT, WITH IOL INSERTION (Right: Eye)  Patient Location: Short Stay  Anesthesia Type:MAC  Level of Consciousness: awake, alert , and oriented  Airway & Oxygen Therapy: Patient Spontanous Breathing  Post-op Assessment: Report given to RN and Post -op Vital signs reviewed and stable  Post vital signs: Reviewed and stable  Last Vitals:  Vitals Value Taken Time  BP 117/68 07/09/23 09:59  Temp 36.4 C 07/09/23 09:59  Pulse 57 07/09/23 09:59  Resp 18 07/09/23 09:59  SpO2 98 % 07/09/23 09:59    Last Pain:  Vitals:   07/09/23 0959  TempSrc: Oral  PainSc:       Patients Stated Pain Goal: 5 (07/09/23 0845)  Complications: No notable events documented.

## 2023-07-09 NOTE — Anesthesia Preprocedure Evaluation (Signed)
 Anesthesia Evaluation  Patient identified by MRN, date of birth, ID band Patient awake    Reviewed: Allergy & Precautions, H&P , NPO status , Patient's Chart, lab work & pertinent test results  Airway Mallampati: II  TM Distance: >3 FB Neck ROM: Full    Dental  (+) Edentulous Upper, Edentulous Lower   Pulmonary shortness of breath and with exertion, sleep apnea (mild) , COPD,  COPD inhaler, Patient abstained from smoking., former smoker   Pulmonary exam normal  + wheezing      Cardiovascular hypertension, Pt. on medications + DOE  Normal cardiovascular exam Rhythm:Regular Rate:Normal     Neuro/Psych  Headaches  Neuromuscular disease CVA  negative psych ROS   GI/Hepatic Neg liver ROS,GERD  Medicated and Poorly Controlled,,  Endo/Other  negative endocrine ROS    Renal/GU negative Renal ROS  negative genitourinary   Musculoskeletal negative musculoskeletal ROS (+)    Abdominal   Peds negative pediatric ROS (+)  Hematology negative hematology ROS (+)   Anesthesia Other Findings   Reproductive/Obstetrics negative OB ROS                              Anesthesia Physical Anesthesia Plan  ASA: 3  Anesthesia Plan: MAC   Post-op Pain Management: Minimal or no pain anticipated   Induction:   PONV Risk Score and Plan:   Airway Management Planned: Nasal Cannula and Natural Airway  Additional Equipment: None  Intra-op Plan:   Post-operative Plan:   Informed Consent: I have reviewed the patients History and Physical, chart, labs and discussed the procedure including the risks, benefits and alternatives for the proposed anesthesia with the patient or authorized representative who has indicated his/her understanding and acceptance.     Dental advisory given  Plan Discussed with: CRNA  Anesthesia Plan Comments:          Anesthesia Quick Evaluation

## 2023-07-09 NOTE — Discharge Instructions (Addendum)
 Please discharge patient when stable, will follow up today with Dr. June Leap at the [facility] office immediately following discharge.  Leave shield in place until visit.  All paperwork with discharge instructions will be given at the office.  [facility] Address:  [address]  [place], [place] [address]

## 2023-07-10 ENCOUNTER — Encounter (HOSPITAL_COMMUNITY): Payer: Self-pay | Admitting: Ophthalmology

## 2023-07-11 LAB — GLUCOSE, CAPILLARY: Glucose-Capillary: 107 mg/dL — ABNORMAL HIGH (ref 70–99)

## 2023-07-16 ENCOUNTER — Ambulatory Visit (INDEPENDENT_AMBULATORY_CARE_PROVIDER_SITE_OTHER)

## 2023-07-16 ENCOUNTER — Ambulatory Visit: Payer: Self-pay

## 2023-07-16 DIAGNOSIS — E538 Deficiency of other specified B group vitamins: Secondary | ICD-10-CM

## 2023-07-16 MED ORDER — CYANOCOBALAMIN 1000 MCG/ML IJ SOLN
1000.0000 ug | Freq: Once | INTRAMUSCULAR | Status: AC
  Administered 2023-07-16: 1000 ug via INTRAMUSCULAR

## 2023-07-16 NOTE — Progress Notes (Signed)
 Patient is in office today for a nurse visit for B12 Injection. Patient Injection was given in the  Right deltoid. Patient tolerated injection well.

## 2023-07-18 ENCOUNTER — Ambulatory Visit
Admission: EM | Admit: 2023-07-18 | Discharge: 2023-07-18 | Disposition: A | Discharge status: Home / Self Care | Attending: Family Medicine | Admitting: Family Medicine

## 2023-07-18 DIAGNOSIS — R21 Rash and other nonspecific skin eruption: Secondary | ICD-10-CM | POA: Diagnosis not present

## 2023-07-18 MED ORDER — CETIRIZINE HCL 10 MG PO TABS: 10.0000 mg | ORAL_TABLET | Freq: Every day | ORAL | 0 refills | Status: AC

## 2023-07-18 MED ORDER — METHYLPREDNISOLONE SODIUM SUCC 125 MG IJ SOLR
80.0000 mg | Freq: Once | INTRAMUSCULAR | Status: AC
  Administered 2023-07-18: 80 mg via INTRAMUSCULAR

## 2023-07-18 NOTE — Discharge Instructions (Signed)
 I have prescribed an antihistamine to be taken daily until rash resolves and we have given you a steroid shot today.  This should resolve your rash but if symptoms do not resolve or worsen follow-up for a recheck.  As discussed, the steroid medicine may elevate your blood sugars so keep close track of them for the next few days to a week.

## 2023-07-18 NOTE — ED Provider Notes (Signed)
 [facility]    CSN: [id number] Arrival date & time: 07/18/23  1828      History   Chief Complaint No chief complaint on file.   HPI Jerry Aguirre is a 58 y.o. male.   Patient presenting today with 3-day history of a widespread rash to the abdomen, arms, back.  He denies significant itching, pain, throat itching or swelling, chest tightness, wheezing, nausea, vomiting, new foods or medications, new hygiene products or outdoor exposures.  So far not trying anything over-the-counter for symptoms.    Past Medical History:  Diagnosis Date   COPD (chronic obstructive pulmonary disease) (HCC)    Dyspnea    GERD (gastroesophageal reflux disease)    Headache    Hyperlipidemia    Hypertension    Sleep apnea    pt says his OSA was mild and they said he did not need a CPAP    Patient Active Problem List   Diagnosis Date Noted   Acute ischemic stroke (HCC) 06/01/2023   Left carpal tunnel syndrome 05/18/2023   B12 deficiency 05/14/2023   Vision changes 05/14/2023   Neoplasm of uncertain behavior of skin of ear 04/21/2023   Left swimmer's ear 04/07/2023   Urinary hesitancy 01/22/2023   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 01/07/2023   Left ear pain 01/04/2023   Chronic mastoiditis of left side 01/04/2023   Acute suppurative otitis media of left ear with spontaneous rupture of tympanic membrane 12/06/2022   Bilateral diabetic foot ulcer associated with secondary diabetes mellitus (HCC) 11/29/2022   Acute hip pain, left 10/19/2022   Hepatic steatosis 10/19/2022   Left acute otitis media 10/19/2022   Pain due to onychomycosis of toenails of both feet 07/31/2022   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   Seasonal allergies 04/13/2022   Type 2 diabetes mellitus without complications (HCC) 03/06/2022   Hypokalemia 03/06/2022   Low vitamin B12 level 03/06/2022   Barrett's esophagus with high grade dysplasia 01/25/2022   Left hand pain 01/25/2022    Positive screening for depression on 9-item Patient Health Questionnaire (PHQ-9) 12/28/2021   Change in bowel habits 12/22/2021   Dysphagia 12/15/2021   Diastasis recti 12/13/2021   Abdominal distention 12/13/2021   Hernia, abdominal 11/29/2021   Bilateral lower extremity edema 11/16/2021   Right shoulder pain 11/16/2021   Hoarseness 11/08/2021   Vitamin D  deficiency 10/06/2021   Obesity (BMI 30-39.9) 10/06/2021   Right flank pain 10/06/2021   Nasal sinus congestion 10/06/2021   Chronic musculoskeletal pain 09/26/2021   RLS (restless legs syndrome) 09/26/2021   Preventative health care 09/26/2021   DOE (dyspnea on exertion) 09/13/2021   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) 07/27/2021   Cigarette smoker 07/27/2021   S/P laminectomy 04/21/2021   Lumbar stenosis 04/21/2021   Dyslipidemia 07/22/2020   Prediabetes 07/22/2020   Focal neurological deficit 07/21/2020   COPD  GOLD 3 07/21/2020   GERD (gastroesophageal reflux disease) 07/21/2020   Essential hypertension 07/21/2020   Long-term current use of opiate analgesic 06/01/2020   Degeneration of lumbar intervertebral disc 04/25/2020   Edema of lower extremity 11/20/2019   Abnormality of gait due to impairment of balance 10/21/2019   Pain in joint of right hip 08/20/2019   Insomnia 05/01/2019   Fatigue 03/06/2019    Past Surgical History:  Procedure Laterality Date   BIOPSY  12/22/2021   Procedure: BIOPSY;  Surgeon: Eartha Angelia Sieving, MD;  Location: [facility];  Service: Gastroenterology;;   CATARACT EXTRACTION W/PHACO  Left 06/25/2023   Procedure: PHACOEMULSIFICATION, CATARACT, WITH IOL INSERTION;  Surgeon: Harrie Agent, MD;  Location: [facility];  Service: Ophthalmology;  Laterality: Left;  CDE: 4.62   CATARACT EXTRACTION W/PHACO Right 07/09/2023   Procedure: PHACOEMULSIFICATION, CATARACT, WITH IOL INSERTION;  Surgeon: Harrie Agent, MD;  Location: [facility];  Service: Ophthalmology;   Laterality: Right;  CDE: 5.60   COLONOSCOPY WITH PROPOFOL  N/A 12/22/2021   Procedure: COLONOSCOPY WITH PROPOFOL ;  Surgeon: Eartha Angelia Sieving, MD;  Location: [facility];  Service: Gastroenterology;  Laterality: N/A;  10:15am, asa 3   ESOPHAGOGASTRODUODENOSCOPY (EGD) WITH PROPOFOL  N/A 12/22/2021   Procedure: ESOPHAGOGASTRODUODENOSCOPY (EGD) WITH PROPOFOL ;  Surgeon: Eartha Angelia Sieving, MD;  Location: [facility];  Service: Gastroenterology;  Laterality: N/A;   HERNIA REPAIR Right    inguinal   LUMBAR LAMINECTOMY/DECOMPRESSION MICRODISCECTOMY N/A 04/21/2021   Procedure: LAMINECTOMY, MEDIAL FACETECTOMY LUMBAR FOUR- FIVE;  Surgeon: Debby Dorn MATSU, MD;  Location: [facility];  Service: Neurosurgery;  Laterality: N/A;   POLYPECTOMY  12/22/2021   Procedure: POLYPECTOMY;  Surgeon: Eartha Angelia Sieving, MD;  Location: [facility];  Service: Gastroenterology;;   TONSILLECTOMY     removed as a child       Home Medications    Prior to Admission medications   Medication Sig Start Date End Date Taking? Authorizing Provider  cetirizine  (ZYRTEC  ALLERGY) 10 MG tablet Take 1 tablet (10 mg total) by mouth daily. 07/18/23  Yes Stuart Vernell Norris, PA-C  ACCU-CHEK GUIDE TEST test strip TEST EVERY DAY 05/03/23   Melvenia Manus BRAVO, MD  Accu-Chek Softclix Lancets lancets USE AS DIRECTED TO CHECK BLOOD SUGAR IN THE MORNING, AT NOON, AND AT BEDTIME. 04/27/22   Melvenia Manus BRAVO, MD  acetaminophen  (TYLENOL ) 325 MG tablet Take 2 tablets (650 mg total) by mouth every 6 (six) hours as needed for mild pain (pain score 1-3) (or Fever >/= 101). 06/02/23   Pearlean Manus, MD  albuterol  (PROVENTIL ) (2.5 MG/3ML) 0.083% nebulizer solution Take 3 mLs (2.5 mg total) by nebulization every 2 (two) hours as needed for wheezing or shortness of breath. 06/02/23   Pearlean Manus, MD  albuterol  (VENTOLIN  HFA) 108 (90 Base) MCG/ACT inhaler Inhale 2 puffs into the lungs every 4 (four) hours as needed for wheezing or  shortness of breath. 06/02/23   Pearlean Manus, MD  aspirin  EC 81 MG tablet Take 1 tablet (81 mg total) by mouth daily with breakfast. Swallow whole. 06/03/23   Pearlean Manus, MD  atorvastatin  (LIPITOR) 80 MG tablet Take 1 tablet (80 mg total) by mouth daily. 01/23/23   Melvenia Manus BRAVO, MD  baclofen  (LIORESAL ) 10 MG tablet Take 1 tablet (10 mg total) by mouth 2 (two) times daily as needed. 06/20/23   Bevely Doffing, FNP  Blood Glucose Monitoring Suppl DEVI 1 each by Does not apply route in the morning, at noon, and at bedtime. May substitute to any manufacturer covered by patient's insurance. 04/27/22   Melvenia Manus BRAVO, MD  cetirizine  (ZYRTEC ) 10 MG tablet Take 1 tablet (10 mg total) by mouth daily. 05/14/23   Bevely Doffing, FNP  clobetasol  ointment (TEMOVATE ) 0.05 % Apply 1 Application topically 2 (two) times daily. 06/27/23   Christine Rush, DPM  cyclobenzaprine  (FLEXERIL ) 10 MG tablet Take 1 tablet (10 mg total) by mouth 3 (three) times daily as needed for muscle spasms. 05/14/23   Bevely Doffing, FNP  Elastic Bandages & Supports (MEDICAL COMPRESSION STOCKINGS) MISC 2 each by Does not apply route daily. 11/23/21   Dixon,  Manus BRAVO, MD  furosemide  (LASIX ) 20 MG tablet Take 1 tablet (20 mg total) by mouth daily as needed for fluid or edema. TAKE (1) TABLET BY MOUTH   DAILY AS NEEDED FOR FLUID OR EDEMA 06/02/23   Pearlean Manus, MD  lactulose  (CHRONULAC ) 10 GM/15ML solution Take 10 g by mouth at bedtime. 03/21/21   [provider]  meloxicam  (MOBIC ) 7.5 MG tablet Take 1 tablet (7.5 mg total) by mouth 2 (two) times daily. 11/09/21   Melvenia Manus BRAVO, MD  Menthol -Zinc Oxide (GOLD BOND EX) Apply 1 Application topically daily as needed (dry skin).    [provider]  metFORMIN  (GLUCOPHAGE -XR) 500 MG 24 hr tablet Take 1 tablet (500 mg total) by mouth daily with breakfast. 06/02/23   Pearlean Manus, MD  metoprolol  succinate (TOPROL -XL) 25 MG 24 hr tablet Take 1 tablet (25 mg total) by mouth  daily. 06/20/23   Bevely Doffing, FNP  Nebulizers (COMPRESSOR/NEBULIZER) MISC 1 Units by Does not apply route daily as needed (Cough, wheezing or shortness of breath). Nebulizer machine with supplies and tubing 06/02/23   Emokpae, Courage, MD  ondansetron  (ZOFRAN ) 4 MG tablet Take 1 tablet (4 mg total) by mouth 2 (two) times daily. 05/14/23   Bevely Doffing, FNP  oxyCODONE -acetaminophen  (PERCOCET) 10-325 MG tablet TAKE 1 TABLET EVERY 4-6 HOURS. MAX 5 PER DAY FOR 30 DAYS Patient taking differently: Take 1 tablet by mouth every 4 (four) hours as needed for pain. 06/05/22   Melvenia Manus BRAVO, MD  OXYCONTIN  10 MG 12 hr tablet Take 10 mg by mouth at bedtime. 04/02/23   [provider]  pantoprazole  (PROTONIX ) 40 MG tablet Take 1 tablet (40 mg total) by mouth 2 (two) times daily. 06/20/23   Bevely Doffing, FNP  potassium chloride  (KLOR-CON ) 10 MEQ tablet Take 1 tablet (10 mEq total) by mouth daily. Take While taking Triamterene - HCTZ 06/02/23   Pearlean Manus, MD  pramipexole  (MIRAPEX ) 0.75 MG tablet Take 1-2 tablets (0.75-1.5 mg total) by mouth See admin instructions. Take 0.75 mg in the morning and 1.5 mg at night 05/14/23   Bevely Doffing, FNP  pregabalin  (LYRICA ) 150 MG capsule Take 1 capsule (150 mg total) by mouth daily as needed. 05/14/23   Bevely Doffing, FNP  Semaglutide , 1 MG/DOSE, (OZEMPIC , 1 MG/DOSE,) 4 MG/3ML SOPN Inject 1 mg into the skin once a week. 05/09/23   Melvenia Manus BRAVO, MD  Simethicone  125 MG CAPS Take 1 capsule (125 mg total) by mouth every 6 (six) hours as needed (bloating). 12/22/21   Eartha Angelia Sieving, MD  tamsulosin  (FLOMAX ) 0.4 MG CAPS capsule Take 1 capsule (0.4 mg total) by mouth daily. 05/14/23   Bevely Doffing, FNP  terbinafine  (LAMISIL ) 250 MG tablet Take 1 tablet (250 mg total) by mouth daily. 06/27/23   Christine Rush, DPM  triamterene -hydrochlorothiazide  (DYAZIDE ) 37.5-25 MG capsule Take 1 each (1 capsule total) by mouth daily. 06/02/23   Pearlean Manus, MD  Vitamin  D, Ergocalciferol , (DRISDOL ) 1.25 MG (50000 UNIT) CAPS capsule TAKE ONE CAPSULE BY MOUTH EVERY 7 DAYS 06/28/23   Bevely Doffing, FNP  zolpidem  (AMBIEN ) 10 MG tablet Take 10 mg by mouth at bedtime. 04/18/21   [provider]    Family History Family History  Problem Relation Age of Onset   Esophageal cancer Mother    Lung cancer Father     Social History Social History   Tobacco Use   Smoking status: Former    Current packs/day: 1.00    Types: Cigarettes  Passive exposure: Past   Smokeless tobacco: Never   Tobacco comments:    Less than 1 pack per day  Vaping Use   Vaping status: Never Used  Substance Use Topics   Alcohol use: Not Currently   Drug use: Yes    Types: Marijuana    Comment: twice a week     Allergies   Lisinopril    Review of Systems Review of Systems Per HPI  Physical Exam Triage Vital Signs ED Triage Vitals  Encounter Vitals Group     BP 07/18/23 1831 116/69     Girls Systolic BP Percentile --      Girls Diastolic BP Percentile --      Boys Systolic BP Percentile --      Boys Diastolic BP Percentile --      Pulse Rate 07/18/23 1831 79     Resp 07/18/23 1831 18     Temp 07/18/23 1831 97.6 F (36.4 C)     Temp Source 07/18/23 1831 Oral     SpO2 07/18/23 1831 95 %     Weight --      Height --      Head Circumference --      Peak Flow --      Pain Score 07/18/23 1833 0     Pain Loc --      Pain Education --      Exclude from Growth Chart --    No data found.  Updated Vital Signs BP 116/69 (BP Location: Right Arm)   Pulse 79   Temp 97.6 F (36.4 C) (Oral)   Resp 18   SpO2 95%   Visual Acuity Right Eye Distance:   Left Eye Distance:   Bilateral Distance:    Right Eye Near:   Left Eye Near:    Bilateral Near:     Physical Exam Vitals and nursing note reviewed.  Constitutional:      Appearance: Normal appearance.  HENT:     Head: Atraumatic.     Mouth/Throat:     Mouth: Mucous membranes are moist.     Pharynx:  Oropharynx is clear.  Eyes:     Extraocular Movements: Extraocular movements intact.     Conjunctiva/sclera: Conjunctivae normal.  Cardiovascular:     Rate and Rhythm: Normal rate.  Pulmonary:     Effort: Pulmonary effort is normal. No respiratory distress.     Breath sounds: No wheezing or rales.  Musculoskeletal:        General: Normal range of motion.     Cervical back: Normal range of motion and neck supple.  Skin:    General: Skin is warm and dry.     Comments: Erythematous maculopapular rash widespread to trunk, upper extremities  Neurological:     General: No focal deficit present.     Mental Status: He is oriented to person, place, and time.  Psychiatric:        Mood and Affect: Mood normal.        Thought Content: Thought content normal.        Judgment: Judgment normal.      UC Treatments / Results  Labs (all labs ordered are listed, but only abnormal results are displayed) Labs Reviewed - No data to display  EKG   Radiology No results found.  Procedures Procedures (including critical care time)  Medications Ordered in UC Medications  methylPREDNISolone  sodium succinate (SOLU-MEDROL ) 125 mg/2 mL injection 80 mg (80 mg Intramuscular Given 07/18/23 1909)  Initial Impression / Assessment and Plan / UC Course  I have reviewed the triage vital signs and the nursing notes.  Pertinent labs & imaging results that were available during my care of the patient were reviewed by me and considered in my medical decision making (see chart for details).     Suspect an allergic dermatitis, unclear etiology.  Treat with IM Solu-Medrol , antihistamines, supportive home care.  Return for worsening symptoms.  Final Clinical Impressions(s) / UC Diagnoses   Final diagnoses:  Rash     Discharge Instructions      I have prescribed an antihistamine to be taken daily until rash resolves and we have given you a steroid shot today.  This should resolve your rash but if  symptoms do not resolve or worsen follow-up for a recheck.  As discussed, the steroid medicine may elevate your blood sugars so keep close track of them for the next few days to a week.    ED Prescriptions     Medication Sig Dispense Auth. Provider   cetirizine  (ZYRTEC  ALLERGY) 10 MG tablet Take 1 tablet (10 mg total) by mouth daily. 20 tablet Stuart Vernell Norris, [place]      PDMP not reviewed this encounter.   Stuart Vernell Norris, [place] 07/18/23 1951

## 2023-07-18 NOTE — ED Triage Notes (Signed)
 Pt reports rash that's on the abdomen both arms and back x 3 days.

## 2023-07-24 ENCOUNTER — Telehealth (HOSPITAL_BASED_OUTPATIENT_CLINIC_OR_DEPARTMENT_OTHER): Payer: Self-pay

## 2023-07-24 NOTE — Telephone Encounter (Signed)
 CMN received signed by provider and mailed to [facility]

## 2023-07-25 ENCOUNTER — Ambulatory Visit (INDEPENDENT_AMBULATORY_CARE_PROVIDER_SITE_OTHER): Admitting: Podiatry

## 2023-07-25 DIAGNOSIS — M79672 Pain in left foot: Secondary | ICD-10-CM

## 2023-07-25 DIAGNOSIS — B351 Tinea unguium: Secondary | ICD-10-CM | POA: Diagnosis not present

## 2023-07-25 DIAGNOSIS — B353 Tinea pedis: Secondary | ICD-10-CM

## 2023-07-25 NOTE — Progress Notes (Signed)
   Subjective:    HPI Presents for follow-up onychomycosis treatment with p.o. Lamisil .  No problems taking medicine with no side effects noted.  Tenderness around toes with walking and wearing shoes.  Also follow-up for tinea pedis says that is doing better much less itching has not noticed any blistering and there is much less redness.   Objective:  Physical Exam   General: AAO x3, NAD  Vascular: DP and PT pulses palpable bilaterally.  Immedate capillary fill time digits. No significant lower extremity edema bilaterally.  Dermatological: Onychomycotic mycotic changes nails 1 through 5 with discoloration nail and subungual debris and thickening of the nail.  0% Clearance of onychomycotic nail changes noted. Tenderness with walking and wearing shoes.  Erythematous rash with some dry peeling skin and Amoxident distribution of the feet probably about 60 to 70% improved  Neruologic: Grossly intact B/L  Musculoskeletal:   Assessment:  Painful onychomycotic nails 1 through 5 bilaterally. Pain feet b/l Tinea pedis bilaterally-improved     Plan:  -Established office visit level 3 for evaluation and management -Patient is tolerating Lamisil  treatment for onychomycotic nails well.  No side effects noted. Will continue this treatment. -Rx: Lamisil  250 mg p.o. daily - Labs ordered today for liver function tests to monitor for any hepatic side effects from the Lamisil .  - Return in 4 weeks for  Lamisil  3

## 2023-07-31 LAB — HEPATIC FUNCTION PANEL
AG Ratio: 1.7 (calc) (ref 1.0–2.5)
ALT: 18 U/L (ref 9–46)
AST: 15 U/L (ref 10–35)
Albumin: 3.9 g/dL (ref 3.6–5.1)
Alkaline phosphatase (APISO): 89 U/L (ref 35–144)
Bilirubin, Direct: 0.1 mg/dL (ref 0.0–0.2)
Globulin: 2.3 g/dL (ref 1.9–3.7)
Indirect Bilirubin: 0.5 mg/dL (ref 0.2–1.2)
Total Bilirubin: 0.6 mg/dL (ref 0.2–1.2)
Total Protein: 6.2 g/dL (ref 6.1–8.1)

## 2023-08-06 ENCOUNTER — Other Ambulatory Visit: Payer: Self-pay

## 2023-08-06 DIAGNOSIS — K219 Gastro-esophageal reflux disease without esophagitis: Secondary | ICD-10-CM

## 2023-08-15 ENCOUNTER — Ambulatory Visit

## 2023-08-17 ENCOUNTER — Ambulatory Visit

## 2023-08-17 DIAGNOSIS — E538 Deficiency of other specified B group vitamins: Secondary | ICD-10-CM | POA: Diagnosis not present

## 2023-08-17 MED ORDER — CYANOCOBALAMIN 1000 MCG/ML IJ SOLN
1000.0000 ug | Freq: Once | INTRAMUSCULAR | Status: AC
  Administered 2023-08-17: 1000 ug via INTRAMUSCULAR

## 2023-08-17 NOTE — Progress Notes (Signed)
 Patient is in office today for a nurse visit for B12 Injection. Patient Injection was given in the  Right deltoid. Patient tolerated injection well.

## 2023-08-21 ENCOUNTER — Telehealth: Payer: Self-pay | Admitting: Podiatry

## 2023-08-21 ENCOUNTER — Other Ambulatory Visit: Payer: Self-pay | Admitting: Podiatry

## 2023-08-21 DIAGNOSIS — B351 Tinea unguium: Secondary | ICD-10-CM

## 2023-08-21 MED ORDER — TERBINAFINE HCL 250 MG PO TABS: 250.0000 mg | ORAL_TABLET | Freq: Every day | ORAL | Status: DC

## 2023-08-21 NOTE — Telephone Encounter (Signed)
 Patient sister called in to confirm if tomorrow appt is still needed.Patient has NOT started the Px because the patient never picked it up. Patient has had the blood work done but did not notify the office of the blood work being completed. Would you still want to see this patient tomorrow? Could you also resend the Px so that the patient may start it with your instructions.   The sister would like a call back at [phone]

## 2023-08-22 ENCOUNTER — Other Ambulatory Visit: Payer: Self-pay

## 2023-08-22 ENCOUNTER — Ambulatory Visit: Admitting: Podiatry

## 2023-08-22 DIAGNOSIS — B351 Tinea unguium: Secondary | ICD-10-CM

## 2023-08-22 MED ORDER — TERBINAFINE HCL 250 MG PO TABS: 250.0000 mg | ORAL_TABLET | Freq: Every day | ORAL | 0 refills | Status: AC

## 2023-08-22 NOTE — Telephone Encounter (Signed)
 I have called and confirmed pharmacy has not received the prescription yet. Please re-send.   Sister was on the line and was informed above and that pharmacy automatic system will call when they receive.

## 2023-08-22 NOTE — Telephone Encounter (Signed)
 Spoke to sister will reschedule appointment in 4 weeks and have patient start medication.

## 2023-08-26 ENCOUNTER — Other Ambulatory Visit: Payer: Self-pay

## 2023-08-26 DIAGNOSIS — M48061 Spinal stenosis, lumbar region without neurogenic claudication: Secondary | ICD-10-CM

## 2023-09-07 ENCOUNTER — Encounter: Payer: Self-pay | Admitting: Radiology

## 2023-09-18 ENCOUNTER — Ambulatory Visit

## 2023-09-19 ENCOUNTER — Ambulatory Visit: Admitting: Podiatry

## 2023-09-21 ENCOUNTER — Ambulatory Visit

## 2023-09-27 ENCOUNTER — Ambulatory Visit: Admitting: Podiatry

## 2023-09-27 ENCOUNTER — Other Ambulatory Visit: Payer: Self-pay

## 2023-09-27 DIAGNOSIS — E559 Vitamin D deficiency, unspecified: Secondary | ICD-10-CM

## 2023-10-09 ENCOUNTER — Ambulatory Visit (INDEPENDENT_AMBULATORY_CARE_PROVIDER_SITE_OTHER): Admitting: Podiatry

## 2023-10-09 DIAGNOSIS — B353 Tinea pedis: Secondary | ICD-10-CM | POA: Diagnosis not present

## 2023-10-09 DIAGNOSIS — B351 Tinea unguium: Secondary | ICD-10-CM

## 2023-10-09 MED ORDER — TERBINAFINE HCL 250 MG PO TABS: 250.0000 mg | ORAL_TABLET | Freq: Every day | ORAL | 0 refills | Status: AC

## 2023-10-09 NOTE — Progress Notes (Signed)
   Subjective:    HPI Presents for follow-up onychomycosis treatment with p.o. Lamisil .  No problems taking medicine with no side effects noted.  Tenderness around toes with walking and wearing shoes.Tenderness with walking and wearing shoes.  Says the rash on the feet has improved but of some of the tenderness is still there he ran out of the Lamisil  2 weeks ago and has not taken any since then has been using ketoconazole   Objective:  Physical Exam   General: AAO x3, NAD  Vascular: DP and PT pulses palpable bilaterally.  Immedate capillary fill time digits. No significant lower extremity edema bilaterally.  Dermatological: Onychomycotic mycotic changes nails 1 through 5 with discoloration nail and subungual debris and thickening of the nail.  5% Clearance of onychomycotic nail changes noted. Tenderness with walking and wearing shoes.  Says the rash on the feet has improved but of some of the tenderness is still there he ran out of the Lamisil  2 weeks ago and has not taken any since then has been using ketoconazole  Neruologic: Grossly intact B/L  Musculoskeletal:   Assessment:  Painful onychomycotic nails 1 through 5 bilaterally. Tinea pedis feet bilaterally     Plan:  -Established office visit level 3 for evaluation and management Continue oral Lamisil  for another month and also continue the ketoconazole cream.  Also he can use some hydrocortisone cream along with ketoconazole cream to cut down on some of the itching and tenderness. -Patient is tolerating Lamisil  treatment for onychomycotic nails well.  No side effects noted. Will continue this treatment. -Rx: Lamisil  250 mg p.o. daily - Labs ordered today for liver function tests to monitor for any hepatic side effects from the Lamisil .  - Return in 4 weeks for  Lamisil  final

## 2023-10-11 ENCOUNTER — Ambulatory Visit

## 2023-10-11 VITALS — BP 113/72 | HR 71 | Ht 64.0 in | Wt 172.0 lb

## 2023-10-11 DIAGNOSIS — E119 Type 2 diabetes mellitus without complications: Secondary | ICD-10-CM

## 2023-10-11 DIAGNOSIS — F17210 Nicotine dependence, cigarettes, uncomplicated: Secondary | ICD-10-CM

## 2023-10-11 DIAGNOSIS — M48061 Spinal stenosis, lumbar region without neurogenic claudication: Secondary | ICD-10-CM | POA: Diagnosis not present

## 2023-10-11 NOTE — Progress Notes (Signed)
 Established Patient Office Visit  Subjective   Patient ID: Jerry Aguirre, male    DOB: 12-01-1965  Age: 58 y.o. MRN: [id number]  Chief Complaint  Patient presents with   Medical Management of Chronic Issues    Follow up    HPI Discussed the use of AI scribe software for clinical note transcription with the patient, who gave verbal consent to proceed.  History of Present Illness   Jerry Aguirre is a 58 year old male who presents with back pain radiating to the front.  Back pain with radiation - Severe back pain radiating to the anterior aspect, particularly pronounced in the mornings - Pain originates in the hip and sometimes is associated with a sensation of swelling, especially when leaning back in a chair - Pain intensity can be severe enough to cause tears - Difficulty getting out of bed in the morning due to pain  Lower extremity swelling - Swelling occurs after prolonged sitting at the table  Glycemic control - Monitors blood glucose three times daily, with readings not exceeding 120 mg/dL - Last hemoglobin J8r was 6  Smoking cessation and respiratory symptoms - Recently quit smoking - Increased phlegm production since quitting smoking  Bowel and urinary habits - Regular bowel movements, three times daily - No nocturia  Medication management - Managing medication refills independently - Recent issue with pain medication refill resolved with assistance from his sister      Patient Active Problem List   Diagnosis Date Noted   Acute ischemic stroke (HCC) 06/01/2023   Left carpal tunnel syndrome 05/18/2023   B12 deficiency 05/14/2023   Vision changes 05/14/2023   Neoplasm of uncertain behavior of skin of ear 04/21/2023   Left swimmer's ear 04/07/2023   Urinary hesitancy 01/22/2023   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 01/07/2023   Left ear pain 01/04/2023   Chronic mastoiditis of left side 01/04/2023   Acute suppurative otitis  media of left ear with spontaneous rupture of tympanic membrane 12/06/2022   Bilateral diabetic foot ulcer associated with secondary diabetes mellitus (HCC) 11/29/2022   Acute hip pain, left 10/19/2022   Hepatic steatosis 10/19/2022   Left acute otitis media 10/19/2022   Pain due to onychomycosis of toenails of both feet 07/31/2022   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   Seasonal allergies 04/13/2022   Type 2 diabetes mellitus without complications (HCC) 03/06/2022   Hypokalemia 03/06/2022   Low vitamin B12 level 03/06/2022   Barrett's esophagus with high grade dysplasia 01/25/2022   Left hand pain 01/25/2022   Positive screening for depression on 9-item Patient Health Questionnaire (PHQ-9) 12/28/2021   Change in bowel habits 12/22/2021   Dysphagia 12/15/2021   Diastasis recti 12/13/2021   Abdominal distention 12/13/2021   Hernia, abdominal 11/29/2021   Bilateral lower extremity edema 11/16/2021   Right shoulder pain 11/16/2021   Hoarseness 11/08/2021   Vitamin D  deficiency 10/06/2021   Obesity (BMI 30-39.9) 10/06/2021   Right flank pain 10/06/2021   Nasal sinus congestion 10/06/2021   Chronic musculoskeletal pain 09/26/2021   RLS (restless legs syndrome) 09/26/2021   Preventative health care 09/26/2021   DOE (dyspnea on exertion) 09/13/2021   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) 07/27/2021   Cigarette smoker 07/27/2021   S/P laminectomy 04/21/2021   Lumbar stenosis 04/21/2021   Dyslipidemia 07/22/2020   Prediabetes 07/22/2020   Focal neurological deficit 07/21/2020   COPD  GOLD 3 07/21/2020   GERD (gastroesophageal reflux disease) 07/21/2020   Essential  hypertension 07/21/2020   Long-term current use of opiate analgesic 06/01/2020   Degeneration of lumbar intervertebral disc 04/25/2020   Edema of lower extremity 11/20/2019   Abnormality of gait due to impairment of balance 10/21/2019   Pain in joint of right hip 08/20/2019   Insomnia  05/01/2019   Fatigue 03/06/2019    ROS    Objective:     BP 113/72   Pulse 71   Ht 5' 4 (1.626 m)   Wt 172 lb (78 kg)   SpO2 92%   BMI 29.52 kg/m  BP Readings from Last 3 Encounters:  10/11/23 113/72  07/18/23 116/69  07/09/23 117/68   Wt Readings from Last 3 Encounters:  10/11/23 172 lb (78 kg)  07/09/23 189 lb (85.7 kg)  07/05/23 189 lb (85.7 kg)     Physical Exam Vitals and nursing note reviewed.  Constitutional:      Appearance: Normal appearance.  HENT:     Head: Normocephalic.  Eyes:     Extraocular Movements: Extraocular movements intact.     Pupils: Pupils are equal, round, and reactive to light.  Cardiovascular:     Rate and Rhythm: Normal rate and regular rhythm.  Pulmonary:     Effort: Pulmonary effort is normal.     Breath sounds: Normal breath sounds.  Musculoskeletal:     Cervical back: Normal range of motion and neck supple.  Neurological:     Mental Status: He is alert and oriented to person, place, and time.  Psychiatric:        Mood and Affect: Mood normal.        Thought Content: Thought content normal.    Last CBC Lab Results  Component Value Date   WBC 8.2 06/02/2023   HGB 14.5 06/02/2023   HCT 43.6 06/02/2023   MCV 88.4 06/02/2023   MCH 29.4 06/02/2023   RDW 14.9 06/02/2023   PLT 266 06/02/2023   Last metabolic panel Lab Results  Component Value Date   GLUCOSE 96 06/02/2023   NA 135 06/02/2023   K 3.2 (L) 06/02/2023   CL 98 06/02/2023   CO2 27 06/02/2023   BUN 17 06/02/2023   CREATININE 1.01 06/02/2023   GFRNONAA >60 06/02/2023   CALCIUM  8.9 06/02/2023   PROT 7.6 10/11/2023   ALBUMIN 4.6 10/11/2023   LABGLOB 2.7 01/22/2023   AGRATIO 1.6 12/13/2021   BILITOT 1.4 (H) 10/11/2023   ALKPHOS 114 10/11/2023   AST 23 10/11/2023   ALT 22 10/11/2023   ANIONGAP 10 06/02/2023   Last lipids Lab Results  Component Value Date   CHOL 127 06/02/2023   HDL 24 (L) 06/02/2023   LDLCALC 69 06/02/2023   TRIG 170 (H) 06/02/2023    CHOLHDL 5.3 06/02/2023   Last hemoglobin A1c Lab Results  Component Value Date   HGBA1C 6.1 (H) 10/11/2023   Last thyroid  functions Lab Results  Component Value Date   TSH 2.630 01/22/2023   Last vitamin D  Lab Results  Component Value Date   VD25OH 26.3 (L) 01/22/2023   Last vitamin B12 and Folate Lab Results  Component Value Date   VITAMINB12 180 (L) 01/22/2023   FOLATE 15.8 01/22/2023      The ASCVD Risk score (Arnett DK, et al., 2019) failed to calculate for the following reasons:   Risk score cannot be calculated because patient has a medical history suggesting prior/existing ASCVD    Assessment & Plan:   Problem List Items Addressed This Visit  Endocrine   Type 2 diabetes mellitus without complications (HCC) - Primary   Type 2 diabetes mellitus with good glycemic control. Last A1c was 6.0. Discussed berberine supplements for glycemic control. - Recheck A1c during upcoming blood work. - Advise to reduce blood glucose monitoring to once daily or as needed. - Discuss potential use of berberine supplements.      Relevant Orders   HgB A1c (Completed)     Other   Lumbar stenosis   Chronic low back pain with anterior radiation, severe in the morning.  - Refer to Dr. Sherley for further evaluation and management.           Relevant Orders   Ambulatory referral to Orthopedic Surgery   Cigarette smoker   Nicotine dependence in remission for one month. Increased phlegm production noted post-cessation. - Recommend over-the-counter Mucinex for phlegm management, advise adequate hydration.          No follow-ups on file.    Leita Longs, FNP

## 2023-10-12 LAB — HEPATIC FUNCTION PANEL
ALT: 22 IU/L (ref 0–44)
AST: 23 IU/L (ref 0–40)
Albumin: 4.6 g/dL (ref 3.8–4.9)
Alkaline Phosphatase: 114 IU/L (ref 47–123)
Bilirubin Total: 1.4 mg/dL — ABNORMAL HIGH (ref 0.0–1.2)
Bilirubin, Direct: 0.3 mg/dL (ref 0.00–0.40)
Total Protein: 7.6 g/dL (ref 6.0–8.5)

## 2023-10-12 LAB — HEMOGLOBIN A1C
Est. average glucose Bld gHb Est-mCnc: 128 mg/dL
Hgb A1c MFr Bld: 6.1 % — ABNORMAL HIGH (ref 4.8–5.6)

## 2023-10-14 ENCOUNTER — Ambulatory Visit: Payer: Self-pay

## 2023-10-14 NOTE — Assessment & Plan Note (Signed)
 Chronic low back pain with anterior radiation, severe in the morning.  - Refer to Dr. Sherley for further evaluation and management.

## 2023-10-14 NOTE — Assessment & Plan Note (Signed)
 Type 2 diabetes mellitus with good glycemic control. Last A1c was 6.0. Discussed berberine supplements for glycemic control. - Recheck A1c during upcoming blood work. - Advise to reduce blood glucose monitoring to once daily or as needed. - Discuss potential use of berberine supplements.

## 2023-10-14 NOTE — Assessment & Plan Note (Signed)
 Nicotine dependence in remission for one month. Increased phlegm production noted post-cessation. - Recommend over-the-counter Mucinex for phlegm management, advise adequate hydration.

## 2023-10-15 ENCOUNTER — Other Ambulatory Visit: Payer: Self-pay | Admitting: Internal Medicine

## 2023-10-15 DIAGNOSIS — K760 Fatty (change of) liver, not elsewhere classified: Secondary | ICD-10-CM

## 2023-10-15 DIAGNOSIS — E119 Type 2 diabetes mellitus without complications: Secondary | ICD-10-CM

## 2023-10-24 ENCOUNTER — Other Ambulatory Visit (HOSPITAL_COMMUNITY): Payer: Self-pay

## 2023-10-24 ENCOUNTER — Telehealth: Payer: Self-pay | Admitting: Pharmacy Technician

## 2023-10-24 NOTE — Telephone Encounter (Signed)
 Pharmacy Patient Advocate Encounter   Received notification from CoverMyMeds that prior authorization for Ozempic  (1 MG/DOSE) 4MG /3ML pen-injectors is required/requested.   Insurance verification completed.   The patient is insured through [facility] MEDICAID.   Per test claim: The current 28 day co-pay is, $4.00.  No PA needed at this time. This test claim was processed through [facility]- copay amounts may vary at other pharmacies due to pharmacy/plan contracts, or as the patient moves through the different stages of their insurance plan.    Called [employer] and had them reprocess the prescription. They advised that they did receive a paid claim.

## 2023-10-31 ENCOUNTER — Other Ambulatory Visit: Payer: Self-pay

## 2023-10-31 ENCOUNTER — Encounter (INDEPENDENT_AMBULATORY_CARE_PROVIDER_SITE_OTHER): Payer: Self-pay | Admitting: Gastroenterology

## 2023-10-31 DIAGNOSIS — M48061 Spinal stenosis, lumbar region without neurogenic claudication: Secondary | ICD-10-CM

## 2023-11-06 ENCOUNTER — Ambulatory Visit: Admitting: Podiatry

## 2023-11-13 ENCOUNTER — Ambulatory Visit: Payer: Self-pay

## 2023-11-19 ENCOUNTER — Encounter: Payer: Self-pay | Admitting: Radiology

## 2023-11-23 ENCOUNTER — Other Ambulatory Visit: Payer: Self-pay

## 2023-11-23 DIAGNOSIS — R3911 Hesitancy of micturition: Secondary | ICD-10-CM

## 2023-11-25 ENCOUNTER — Other Ambulatory Visit: Payer: Self-pay

## 2023-11-25 DIAGNOSIS — M48061 Spinal stenosis, lumbar region without neurogenic claudication: Secondary | ICD-10-CM

## 2023-12-12 ENCOUNTER — Other Ambulatory Visit: Payer: Self-pay | Admitting: Orthopedic Surgery

## 2023-12-12 DIAGNOSIS — M5416 Radiculopathy, lumbar region: Secondary | ICD-10-CM

## 2023-12-14 ENCOUNTER — Ambulatory Visit

## 2023-12-15 ENCOUNTER — Other Ambulatory Visit

## 2023-12-19 ENCOUNTER — Inpatient Hospital Stay
Admission: RE | Admit: 2023-12-19 | Discharge: 2023-12-19 | Attending: Orthopedic Surgery | Admitting: Orthopedic Surgery

## 2023-12-19 DIAGNOSIS — M5416 Radiculopathy, lumbar region: Secondary | ICD-10-CM

## 2023-12-20 ENCOUNTER — Other Ambulatory Visit: Payer: Self-pay

## 2023-12-20 DIAGNOSIS — I10 Essential (primary) hypertension: Secondary | ICD-10-CM

## 2023-12-21 ENCOUNTER — Other Ambulatory Visit: Payer: Self-pay

## 2023-12-21 DIAGNOSIS — E559 Vitamin D deficiency, unspecified: Secondary | ICD-10-CM

## 2024-01-08 ENCOUNTER — Other Ambulatory Visit: Payer: Self-pay

## 2024-01-08 DIAGNOSIS — I10 Essential (primary) hypertension: Secondary | ICD-10-CM

## 2024-01-08 MED ORDER — TRIAMTERENE-HCTZ 37.5-25 MG PO CAPS: 1.0000 | ORAL_CAPSULE | Freq: Every day | ORAL | 5 refills | Status: AC

## 2024-01-08 NOTE — Telephone Encounter (Signed)
 Copied from CRM #[id number]. Topic: Clinical - Medication Refill >> Jan 08, 2024  1:10 PM [name] wrote: Medication: triamterene -hydrochlorothiazide  (DYAZIDE ) 37.5-25 MG capsule  Has the patient contacted their pharmacy? Yes (Agent: If no, request that the patient contact the pharmacy for the refill. If patient does not wish to contact the pharmacy document the reason why and proceed with request.) (Agent: If yes, when and what did the pharmacy advise?)  This is the patient's preferred pharmacy:  [facility] [place], [place] - [address] [place] [address] Phone: [phone] Fax: [phone]  Is this the correct pharmacy for this prescription? Yes If no, delete pharmacy and type the correct one.   Has the prescription been filled recently? Yes  Is the patient out of the medication? Yes  Has the patient been seen for an appointment in the last year OR does the patient have an upcoming appointment? Yes  Can we respond through MyChart? Yes  Agent: Please be advised that Rx refills may take up to 3 business days. We ask that you follow-up with your pharmacy.

## 2024-01-09 ENCOUNTER — Other Ambulatory Visit: Payer: Self-pay

## 2024-01-09 DIAGNOSIS — K760 Fatty (change of) liver, not elsewhere classified: Secondary | ICD-10-CM

## 2024-01-09 DIAGNOSIS — E119 Type 2 diabetes mellitus without complications: Secondary | ICD-10-CM

## 2024-01-10 ENCOUNTER — Other Ambulatory Visit: Payer: Self-pay | Admitting: Internal Medicine

## 2024-01-10 DIAGNOSIS — R600 Localized edema: Secondary | ICD-10-CM

## 2024-01-24 ENCOUNTER — Other Ambulatory Visit: Payer: Self-pay | Admitting: Internal Medicine

## 2024-01-24 DIAGNOSIS — E785 Hyperlipidemia, unspecified: Secondary | ICD-10-CM

## 2024-01-30 ENCOUNTER — Encounter (HOSPITAL_COMMUNITY): Payer: Self-pay

## 2024-01-30 ENCOUNTER — Ambulatory Visit (HOSPITAL_COMMUNITY): Admission: RE | Admit: 2024-01-30 | Source: Ambulatory Visit

## 2024-01-31 ENCOUNTER — Other Ambulatory Visit: Payer: Self-pay

## 2024-01-31 DIAGNOSIS — K219 Gastro-esophageal reflux disease without esophagitis: Secondary | ICD-10-CM

## 2024-02-21 ENCOUNTER — Ambulatory Visit: Payer: Self-pay

## 2024-02-21 ENCOUNTER — Emergency Department (HOSPITAL_COMMUNITY)

## 2024-02-21 ENCOUNTER — Observation Stay (HOSPITAL_COMMUNITY)
Admission: EM | Admit: 2024-02-21 | Discharge: 2024-02-22 | Disposition: A | Source: Home / Self Care | Attending: Emergency Medicine | Admitting: Emergency Medicine

## 2024-02-21 ENCOUNTER — Encounter (HOSPITAL_COMMUNITY): Payer: Self-pay | Admitting: Emergency Medicine

## 2024-02-21 ENCOUNTER — Other Ambulatory Visit: Payer: Self-pay

## 2024-02-21 DIAGNOSIS — I10 Essential (primary) hypertension: Secondary | ICD-10-CM

## 2024-02-21 DIAGNOSIS — D123 Benign neoplasm of transverse colon: Secondary | ICD-10-CM

## 2024-02-21 DIAGNOSIS — N400 Enlarged prostate without lower urinary tract symptoms: Secondary | ICD-10-CM | POA: Diagnosis not present

## 2024-02-21 DIAGNOSIS — K5731 Diverticulosis of large intestine without perforation or abscess with bleeding: Secondary | ICD-10-CM

## 2024-02-21 DIAGNOSIS — K219 Gastro-esophageal reflux disease without esophagitis: Secondary | ICD-10-CM

## 2024-02-21 DIAGNOSIS — K922 Gastrointestinal hemorrhage, unspecified: Secondary | ICD-10-CM | POA: Diagnosis not present

## 2024-02-21 DIAGNOSIS — M25562 Pain in left knee: Secondary | ICD-10-CM | POA: Diagnosis not present

## 2024-02-21 LAB — COMPREHENSIVE METABOLIC PANEL WITH GFR
ALT: 16 U/L (ref 0–44)
AST: 24 U/L (ref 15–41)
Albumin: 4.1 g/dL (ref 3.5–5.0)
Alkaline Phosphatase: 99 U/L (ref 38–126)
Anion gap: 15 (ref 5–15)
BUN: 11 mg/dL (ref 6–20)
CO2: 26 mmol/L (ref 22–32)
Calcium: 9.5 mg/dL (ref 8.9–10.3)
Chloride: 101 mmol/L (ref 98–111)
Creatinine, Ser: 1.07 mg/dL (ref 0.61–1.24)
GFR, Estimated: >60 mL/min
Glucose, Bld: 93 mg/dL (ref 70–99)
Potassium: 3.5 mmol/L (ref 3.5–5.1)
Sodium: 142 mmol/L (ref 135–145)
Total Bilirubin: 0.7 mg/dL (ref 0.0–1.2)
Total Protein: 6.9 g/dL (ref 6.5–8.1)

## 2024-02-21 LAB — CBC
HCT: 47.5 % (ref 39.0–52.0)
Hemoglobin: 16.1 g/dL (ref 13.0–17.0)
MCH: 30.2 pg (ref 26.0–34.0)
MCHC: 33.9 g/dL (ref 30.0–36.0)
MCV: 89.1 fL (ref 80.0–100.0)
Platelets: 274 10*3/uL (ref 150–400)
RBC: 5.33 MIL/uL (ref 4.22–5.81)
RDW: 14.2 % (ref 11.5–15.5)
WBC: 7.8 10*3/uL (ref 4.0–10.5)
nRBC: 0 % (ref 0.0–0.2)

## 2024-02-21 LAB — PROTIME-INR
INR: 0.9 (ref 0.8–1.2)
Prothrombin Time: 12.3 s (ref 11.4–15.2)

## 2024-02-21 LAB — TYPE AND SCREEN
ABO/RH(D): O POS
Antibody Screen: NEGATIVE

## 2024-02-21 MED ORDER — ACETAMINOPHEN 325 MG PO TABS: 650.0000 mg | ORAL_TABLET | Freq: Four times a day (QID) | ORAL | Status: DC | PRN

## 2024-02-21 MED ORDER — PANTOPRAZOLE SODIUM 40 MG IV SOLR
40.0000 mg | INTRAVENOUS | Status: DC
  Administered 2024-02-21: 40 mg via INTRAVENOUS
  Filled 2024-02-21: qty 10

## 2024-02-21 MED ORDER — INSULIN ASPART 100 UNIT/ML IJ SOLN: 0.0000 [IU] | INTRAMUSCULAR | Status: DC

## 2024-02-21 MED ORDER — ONDANSETRON HCL 4 MG/2ML IJ SOLN: 4.0000 mg | Freq: Four times a day (QID) | INTRAMUSCULAR | Status: DC | PRN

## 2024-02-21 MED ORDER — ONDANSETRON HCL 4 MG PO TABS: 4.0000 mg | ORAL_TABLET | Freq: Four times a day (QID) | ORAL | Status: DC | PRN

## 2024-02-21 MED ORDER — IOHEXOL 350 MG/ML SOLN
100.0000 mL | Freq: Once | INTRAVENOUS | Status: AC | PRN
  Administered 2024-02-21: 100 mL via INTRAVENOUS

## 2024-02-21 MED ORDER — ACETAMINOPHEN 650 MG RE SUPP: 650.0000 mg | Freq: Four times a day (QID) | RECTAL | Status: DC | PRN

## 2024-02-21 MED ORDER — PEG 3350-KCL-NA BICARB-NACL 420 G PO SOLR
4000.0000 mL | Freq: Once | ORAL | Status: AC
  Administered 2024-02-21: 4000 mL via ORAL
  Filled 2024-02-21: qty 4000
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
## 2024-02-21 NOTE — ED Notes (Signed)
 Pt ambulatory without assistance to the BR.

## 2024-02-21 NOTE — ED Triage Notes (Signed)
 Pt to ER with c/o having 3 BMs today that are nothing but blood.  Pt also c/o ear ache and sore throat.

## 2024-02-21 NOTE — Telephone Encounter (Signed)
" °  FYI Only or Action Required?: Action required by provider: request for appointment.  Patient was last seen in primary care on 10/11/2023 by Bevely Doffing, FNP.  Called Nurse Triage reporting Otalgia.  Symptoms began several days ago.  Interventions attempted: Nothing.  Symptoms are: gradually worsening.  Triage Disposition: See Physician Within 24 Hours  Patient/caregiver understands and will follow disposition?: No, wishes to speak with PCP    Reason for Triage: Patient has been sick for a week now. Symptoms include: weakness, fatigue, vomiting, loose bowels, body aches.   Reason for Disposition  Earache  (Exceptions: Brief ear pain of lasting less than 60 minutes, or earache occurring during air travel.)  Answer Assessment - Initial Assessment Questions Pt had vomiting that's resolved, diarrhea that's improved. Now has earache. Declines appt in other office.   1. LOCATION: Which ear is involved?     Both 2. ONSET: When did the ear pain start?      Few days ago 3. SEVERITY: How bad is the pain?  (Scale 1-10; mild, moderate or severe)     moderate 4. URI SYMPTOMS: Do you have a runny nose or cough?     Mild cough 5. FEVER: Do you have a fever? If Yes, ask: What is your temperature, how was it measured, and when did it start?     no 6. CAUSE: Have you been swimming recently?, How often do you use Q-TIPS?, Have you had any recent air travel or scuba diving?     no 7. OTHER SYMPTOMS: Do you have any other symptoms? (e.g., decreased hearing, dizziness, headache, stiff neck, vomiting)     fatigue  Protocols used: Earache-A-AH  "
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
## 2024-02-21 NOTE — ED Notes (Signed)
 Per MD, pt may have clear liquids, pt given water  per pt request

## 2024-02-22 ENCOUNTER — Encounter (HOSPITAL_COMMUNITY)
Admission: EM | Disposition: A | Discharge status: Home / Self Care | Payer: Self-pay | Source: Home / Self Care | Attending: Emergency Medicine

## 2024-02-22 ENCOUNTER — Observation Stay (HOSPITAL_COMMUNITY): Admitting: Certified Registered"

## 2024-02-22 ENCOUNTER — Telehealth: Payer: Self-pay | Admitting: Gastroenterology

## 2024-02-22 ENCOUNTER — Encounter (HOSPITAL_COMMUNITY): Payer: Self-pay | Admitting: Internal Medicine

## 2024-02-22 DIAGNOSIS — D123 Benign neoplasm of transverse colon: Secondary | ICD-10-CM

## 2024-02-22 DIAGNOSIS — K5731 Diverticulosis of large intestine without perforation or abscess with bleeding: Secondary | ICD-10-CM

## 2024-02-22 LAB — COMPREHENSIVE METABOLIC PANEL WITH GFR
ALT: 23 U/L (ref 0–44)
AST: 29 U/L (ref 15–41)
Albumin: 4 g/dL (ref 3.5–5.0)
Alkaline Phosphatase: 92 U/L (ref 38–126)
Anion gap: 11 (ref 5–15)
BUN: 13 mg/dL (ref 6–20)
CO2: 29 mmol/L (ref 22–32)
Calcium: 9 mg/dL (ref 8.9–10.3)
Chloride: 102 mmol/L (ref 98–111)
Creatinine, Ser: 0.99 mg/dL (ref 0.61–1.24)
GFR, Estimated: >60 mL/min
Glucose, Bld: 94 mg/dL (ref 70–99)
Potassium: 3.7 mmol/L (ref 3.5–5.1)
Sodium: 142 mmol/L (ref 135–145)
Total Bilirubin: 1.1 mg/dL (ref 0.0–1.2)
Total Protein: 6.6 g/dL (ref 6.5–8.1)

## 2024-02-22 LAB — CBC
HCT: 47.4 % (ref 39.0–52.0)
Hemoglobin: 16.2 g/dL (ref 13.0–17.0)
MCH: 30.2 pg (ref 26.0–34.0)
MCHC: 34.2 g/dL (ref 30.0–36.0)
MCV: 88.4 fL (ref 80.0–100.0)
Platelets: 249 10*3/uL (ref 150–400)
RBC: 5.36 MIL/uL (ref 4.22–5.81)
RDW: 14 % (ref 11.5–15.5)
WBC: 8.6 10*3/uL (ref 4.0–10.5)
nRBC: 0 % (ref 0.0–0.2)

## 2024-02-22 LAB — CBG MONITORING, ED: Glucose-Capillary: 99 mg/dL (ref 70–99)

## 2024-02-22 LAB — PHOSPHORUS: Phosphorus: 2.4 mg/dL — ABNORMAL LOW (ref 2.5–4.6)

## 2024-02-22 LAB — GLUCOSE, CAPILLARY: Glucose-Capillary: 87 mg/dL (ref 70–99)

## 2024-02-22 LAB — MAGNESIUM: Magnesium: 2.1 mg/dL (ref 1.7–2.4)

## 2024-02-22 MED ORDER — ALBUTEROL SULFATE (2.5 MG/3ML) 0.083% IN NEBU
2.5000 mg | INHALATION_SOLUTION | RESPIRATORY_TRACT | Status: DC | PRN
  Administered 2024-02-22: 2.5 mg via RESPIRATORY_TRACT
  Filled 2024-02-22: qty 3

## 2024-02-22 MED ORDER — PRAMIPEXOLE DIHYDROCHLORIDE 0.25 MG PO TABS: 1.5000 mg | ORAL_TABLET | Freq: Every day | ORAL | Status: DC

## 2024-02-22 MED ORDER — METOPROLOL SUCCINATE ER 25 MG PO TB24: 25.0000 mg | ORAL_TABLET | Freq: Every day | ORAL | Status: DC

## 2024-02-22 MED ORDER — IPRATROPIUM-ALBUTEROL 0.5-2.5 (3) MG/3ML IN SOLN: 3.0000 mL | RESPIRATORY_TRACT | Status: DC

## 2024-02-22 MED ORDER — OXYCODONE HCL 5 MG PO TABS: 5.0000 mg | ORAL_TABLET | ORAL | Status: DC | PRN

## 2024-02-22 MED ORDER — ZOLPIDEM TARTRATE 5 MG PO TABS: 10.0000 mg | ORAL_TABLET | Freq: Every day | ORAL | Status: DC

## 2024-02-22 MED ORDER — TAMSULOSIN HCL 0.4 MG PO CAPS: 0.4000 mg | ORAL_CAPSULE | Freq: Every day | ORAL | Status: DC

## 2024-02-22 MED ORDER — OXYCODONE-ACETAMINOPHEN 10-325 MG PO TABS: 1.0000 | ORAL_TABLET | ORAL | Status: DC | PRN

## 2024-02-22 MED ORDER — LIDOCAINE HCL (CARDIAC) PF 100 MG/5ML IV SOSY
PREFILLED_SYRINGE | INTRAVENOUS | Status: DC | PRN
  Administered 2024-02-22: 100 mg via INTRAVENOUS

## 2024-02-22 MED ORDER — OXYCODONE-ACETAMINOPHEN 5-325 MG PO TABS: 1.0000 | ORAL_TABLET | Freq: Four times a day (QID) | ORAL | Status: DC | PRN

## 2024-02-22 MED ORDER — STERILE WATER FOR IRRIGATION IR SOLN
Status: DC | PRN
  Administered 2024-02-22: 60 mL

## 2024-02-22 MED ORDER — LACTATED RINGERS IV SOLN: INTRAVENOUS | Status: DC

## 2024-02-22 MED ORDER — CYCLOBENZAPRINE HCL 10 MG PO TABS: 10.0000 mg | ORAL_TABLET | Freq: Three times a day (TID) | ORAL | Status: DC | PRN

## 2024-02-22 MED ORDER — LACTATED RINGERS IV SOLN: INTRAVENOUS | Status: DC | PRN

## 2024-02-22 MED ORDER — PREGABALIN 75 MG PO CAPS: 150.0000 mg | ORAL_CAPSULE | Freq: Every day | ORAL | Status: DC

## 2024-02-22 MED ORDER — PRAMIPEXOLE DIHYDROCHLORIDE 0.25 MG PO TABS: 0.7500 mg | ORAL_TABLET | Freq: Every day | ORAL | Status: DC

## 2024-02-22 MED ORDER — OXYCODONE HCL ER 10 MG PO T12A: 10.0000 mg | EXTENDED_RELEASE_TABLET | Freq: Every day | ORAL | Status: DC

## 2024-02-22 MED ORDER — ATORVASTATIN CALCIUM 40 MG PO TABS: 80.0000 mg | ORAL_TABLET | Freq: Every day | ORAL | Status: DC

## 2024-02-22 MED ORDER — SODIUM CHLORIDE 0.9 % IV SOLN: INTRAVENOUS | Status: DC

## 2024-02-22 MED ORDER — PROPOFOL 10 MG/ML IV BOLUS
INTRAVENOUS | Status: DC | PRN
  Administered 2024-02-22: 175 ug/kg/min via INTRAVENOUS
  Administered 2024-02-22: 100 mg via INTRAVENOUS

## 2024-02-22 NOTE — Progress Notes (Signed)
 Inpatient Care Management (ICM) has reviewed patient and no other ICM needs have been identified at this time. We will continue to monitor patient advancement through interdisciplinary progression rounds. If new patient transition needs arise, please place a ICM consult.    02/22/24 1027  TOC Brief Assessment  Insurance and Status Reviewed  Patient has primary care physician Yes  Home environment has been reviewed From Home  Prior level of function: Independent  Prior/Current Home Services No current home services  Social Drivers of Health Review SDOH reviewed interventions complete  Readmission risk has been reviewed Yes  Transition of care needs no transition of care needs at this time

## 2024-02-22 NOTE — Discharge Summary (Signed)
 Physician Discharge Summary  Jerry Aguirre FMW:[id number] DOB: 1965-03-27 DOA: 02/21/2024  PCP: Bevely Doffing, FNP  Admit date: 02/21/2024 Discharge date: 02/22/2024  Admitted From: Home Disposition: Home  Recommendations for Outpatient Follow-up:  Follow up with PCP in 1 week  Follow-up with GI regarding pathology findings.  Follow-up with primary GI for surveillance of Barrett's esophagus. Follow up in ED if recurrence of GI bleeding.  Home Health: No Equipment/Devices: None  Discharge Condition: Stable CODE STATUS: Full Diet recommendation: Heart healthy  Brief/Interim Summary:   Jerry Aguirre is a 59 y.o. male with medical history significant of hypertension, hyperlipidemia, GERD, chronic hypoxic respiratory failure due to COPD, obesity with OSA who presents to the emergency department due to 3 episodes of red blood per rectum patient denies anticoagulant use.    ED course In the emergency department, he was hemodynamically stable.  Workup in the ED showed normal CBC and BMP.  Hemoglobin was 16. CT angiography of abdomen and pelvis with contrast showed no active GI bleeding and no acute findings. Left knee x-ray showed no evidence of acute traumatic injury. Patient was evaluated by GI, he was prepped and underwent colonoscopy the next day.   FINDINGS:per Dr Cinderella   - Preparation of the colon was fair. - The examined portion of the ileum was normal. - Diverticulosis in the sigmoid colon. - One 4 mm polyp in the ascending colon, removed with a cold snare. Resected and retrieved. - Non- bleeding internal hemorrhoids.    There is no evidence of active or old bleeding throughout the examined colon, this was likley self- limiting diverticular bleed   RECOMMENDATIONS   - Await pathology results.  - Advance diet as tolerated.  - Continue present medications.  - Bowel prep was adequate for the indication procedure was done ( GI Bleed) but not for surveillance of polyp: Suggest  repeat colonoscopy interval to be determined from the last screening colonoscopy performed ( 7 years from 2023)  - Patient verbalizes understanding that he will follow up with Dr Mabel Larger at [facility] for Barretts surveillance until cleared to have procedure done locally   Patient was discharged postprocedure.  He was was asymptomatic and felt stable for discharge Discharge Diagnoses:  Principal Problem:   Lower GI bleed Active Problems:   Diverticulosis of colon with hemorrhage   Adenomatous polyp of transverse colon    Discharge Instructions  Discharge Instructions     Ambulatory referral to Gastroenterology   Complete by: As directed    Barrett's esophagus   What is the reason for referral?: Other   Call MD for:   Complete by: As directed    Call MD or return to the ER if recurrence of GI bleeding   Call MD for:  severe uncontrolled pain   Complete by: As directed    Diet general   Complete by: As directed    Discharge instructions   Complete by: As directed    1.  Follow-up with GI outpatient. 2.  Return to the ER if recurrence of bleeding.   Increase activity slowly   Complete by: As directed       Allergies as of 02/22/2024       Reactions   Lisinopril  Itching   headaches        Medication List     STOP taking these medications    clobetasol  ointment 0.05 % Commonly known as: TEMOVATE    GOLD BOND EX   terbinafine  250 MG tablet Commonly known as: LAMISIL   TAKE these medications    pramipexole  0.75 MG tablet Commonly known as: MIRAPEX  Take 1-2 tablets (0.75-1.5 mg total) by mouth See admin instructions. Take 0.75 mg in the morning and 1.5 mg at night The timing of this medication is very important.   Accu-Chek Guide Test test strip Generic drug: glucose blood TEST EVERY DAY   Accu-Chek Softclix Lancets lancets USE AS DIRECTED TO CHECK BLOOD SUGAR IN THE MORNING, AT NOON, AND AT BEDTIME.   acetaminophen  325 MG tablet Commonly known  as: TYLENOL  Take 2 tablets (650 mg total) by mouth every 6 (six) hours as needed for mild pain (pain score 1-3) (or Fever >/= 101).   albuterol  108 (90 Base) MCG/ACT inhaler Commonly known as: VENTOLIN  HFA Inhale 2 puffs into the lungs every 4 (four) hours as needed for wheezing or shortness of breath.   albuterol  (2.5 MG/3ML) 0.083% nebulizer solution Commonly known as: PROVENTIL  Take 3 mLs (2.5 mg total) by nebulization every 2 (two) hours as needed for wheezing or shortness of breath.   aspirin  EC 81 MG tablet Take 1 tablet (81 mg total) by mouth daily with breakfast. Swallow whole.   atorvastatin  80 MG tablet Commonly known as: LIPITOR Take 1 tablet (80 mg total) by mouth daily.   baclofen  10 MG tablet Commonly known as: LIORESAL  Take 1 tablet (10 mg total) by mouth 2 (two) times daily as needed.   Blood Glucose Monitoring Suppl Devi 1 each by Does not apply route in the morning, at noon, and at bedtime. May substitute to any manufacturer covered by patient's insurance.   cetirizine  10 MG tablet Commonly known as: ZYRTEC  Take 1 tablet (10 mg total) by mouth daily.   cetirizine  10 MG tablet Commonly known as: ZyrTEC  Allergy Take 1 tablet (10 mg total) by mouth daily.   Compressor/Nebulizer Misc 1 Units by Does not apply route daily as needed (Cough, wheezing or shortness of breath). Nebulizer machine with supplies and tubing   cyclobenzaprine  10 MG tablet Commonly known as: FLEXERIL  Take 1 tablet (10 mg total) by mouth 3 (three) times daily as needed for muscle spasms.   furosemide  20 MG tablet Commonly known as: LASIX  Take 1 tablet (20 mg total) by mouth daily as needed for fluid or edema. TAKE (1) TABLET BY MOUTH   DAILY AS NEEDED FOR FLUID OR EDEMA   furosemide  40 MG tablet Commonly known as: LASIX  TAKE (1) TABLET BY MOUTH THREE TIMES DAILY AS NEEDED FOR FLUID OR EDEMA   lactulose  10 GM/15ML solution Commonly known as: CHRONULAC  Take 10 g by mouth at bedtime.    Medical Compression Stockings Misc 2 each by Does not apply route daily.   meloxicam  7.5 MG tablet Commonly known as: MOBIC  Take 1 tablet (7.5 mg total) by mouth 2 (two) times daily.   metFORMIN  500 MG 24 hr tablet Commonly known as: GLUCOPHAGE -XR Take 1 tablet (500 mg total) by mouth daily with breakfast.   metoprolol  succinate 25 MG 24 hr tablet Commonly known as: TOPROL -XL Take 1 tablet (25 mg total) by mouth daily.   ondansetron  4 MG tablet Commonly known as: ZOFRAN  Take 1 tablet (4 mg total) by mouth 2 (two) times daily.   oxyCODONE -acetaminophen  10-325 MG tablet Commonly known as: PERCOCET TAKE 1 TABLET EVERY 4-6 HOURS. MAX 5 PER DAY FOR 30 DAYS What changed: See the new instructions.   OxyCONTIN  10 MG 12 hr tablet Generic drug: oxyCODONE  Take 10 mg by mouth at bedtime.   Ozempic  (1 MG/DOSE) 4  MG/3ML Sopn Generic drug: Semaglutide  (1 MG/DOSE) INJECT 1MG  INTO SKIN ONCE WEEKLY   pantoprazole  40 MG tablet Commonly known as: PROTONIX  Take 1 tablet (40 mg total) by mouth 2 (two) times daily.   potassium chloride  10 MEQ tablet Commonly known as: KLOR-CON  Take 1 tablet (10 mEq total) by mouth daily. Take While taking Triamterene - HCTZ   pregabalin  150 MG capsule Commonly known as: LYRICA  TAKE ONE CAPSULE BY MOUTH DAILY AS NEEDED   Simethicone  125 MG Caps Take 1 capsule (125 mg total) by mouth every 6 (six) hours as needed (bloating).   tamsulosin  0.4 MG Caps capsule Commonly known as: FLOMAX  Take 1 capsule (0.4 mg total) by mouth daily.   triamterene -hydrochlorothiazide  37.5-25 MG capsule Commonly known as: DYAZIDE  Take 1 each (1 capsule total) by mouth daily.   Vitamin D  (Ergocalciferol ) 1.25 MG (50000 UNIT) Caps capsule Commonly known as: DRISDOL  TAKE ONE CAPSULE BY MOUTH EVERY 7 DAYS   zolpidem  10 MG tablet Commonly known as: AMBIEN  Take 10 mg by mouth at bedtime.        Follow-up Information     Bevely Doffing, FNP. Schedule an appointment as  soon as possible for a visit in 1 week(s).   Specialty: Family Medicine Contact information: [address] [place] [address] [phone]         [place] Mabel PARAS, MD. Call in 1 month(s).   Specialty: Gastroenterology Contact information: [address] [facility] [id number] [facility] [place] [address] [phone]                Allergies[1]  Consultations:    Procedures/Studies: CT ANGIO GI BLEED Result Date: 02/21/2024 EXAM: CTA ABDOMEN AND PELVIS WITH CONTRAST 02/21/2024 07:12:11 PM TECHNIQUE: CTA images of the abdomen and pelvis with intravenous contrast. Three-dimensional MIP/volume rendered formations were performed. Automated exposure control, iterative reconstruction, and/or weight based adjustment of the mA/kV was utilized to reduce the radiation dose to as low as reasonably achievable. COMPARISON: None available. CLINICAL HISTORY: lower gi bleed Lower GI bleed. FINDINGS: VASCULATURE: GI BLEED: No active extravasation of contrast within the GI tract. AORTA: Atherosclerotic calcifications of the abdominal aorta. No acute finding. No abdominal aortic aneurysm. No dissection. CELIAC TRUNK: No acute finding. No occlusion or significant stenosis. SUPERIOR MESENTERIC ARTERY: No acute finding. No occlusion or significant stenosis. INFERIOR MESENTERIC ARTERY: No acute finding. No occlusion or significant stenosis. RENAL ARTERIES: No acute finding. No occlusion or significant stenosis. ILIAC ARTERIES: No acute finding. No occlusion or significant stenosis. ABDOMEN/PELVIS: LOWER CHEST: Visualized portion of the lower chest demonstrates no acute abnormality. LIVER: Calcified hepatic granulomata, benign. The liver is otherwise unremarkable. GALLBLADDER AND BILE DUCTS: Gallbladder is unremarkable. No biliary ductal dilatation. SPLEEN: Calcified splenic granulomata, benign. The spleen is otherwise unremarkable. PANCREAS: The pancreas is unremarkable.  ADRENAL GLANDS: Bilateral adrenal glands demonstrate no acute abnormality. KIDNEYS, URETERS AND BLADDER: No stones in the kidneys or ureters. No hydronephrosis. No perinephric or periureteral stranding. Urinary bladder is unremarkable. GI AND BOWEL: Normal appendix (image 119). Stomach and duodenal sweep demonstrate no acute abnormality. There is no bowel obstruction. No abnormal bowel wall thickening or distension. REPRODUCTIVE: Prostate is notable for dystrophic calcifications. Reproductive organs are otherwise unremarkable. PERITONEUM AND RETROPERITONEUM: No ascites or free air. LYMPH NODES: No lymphadenopathy. BONES AND SOFT TISSUES: Mild degenerative changes of the lumbar spine. No acute soft tissue abnormality. IMPRESSION: 1. No active GI bleeding. 2. No acute findings. 3. Aortic Atherosclerosis (ICD10-I70.0). Electronically signed by: Pinkie Pebbles MD  02/21/2024 07:22 PM EST RP Workstation: [id number]   DG Knee Complete 4 Views Left Result Date: 02/21/2024 EXAM: 4 VIEW(S) XRAY OF THE LEFT KNEE 02/21/2024 06:02:00 PM COMPARISON: 2 / 28 / 22 CLINICAL HISTORY: Injury. FINDINGS: BONES AND JOINTS: No acute fracture. No malalignment. No significant joint effusion. SOFT TISSUES: Unremarkable. IMPRESSION: 1. No evidence of acute traumatic injury. Electronically signed by: Norman Gatlin MD 02/21/2024 06:07 PM EST RP Workstation: [id number]      Subjective: Underwent colonoscopy.  Feels fine postprocedure  Discharge Exam: Vitals:   02/22/24 1230 02/22/24 1245  BP: 98/69 117/73  Pulse: 74 67  Resp: (!) 24 19  Temp:    SpO2: 98% 100%    General: Pt is alert, awake, not in acute distress Cardiovascular: rate controlled, S1/S2 + Respiratory: bilateral decreased breath sounds at bases Abdominal: Soft, NT, ND, bowel sounds + Extremities: no edema, no cyanosis    The results of significant diagnostics from this hospitalization (including imaging, microbiology, ancillary and laboratory) are  listed below for reference.     Microbiology: No results found for this or any previous visit (from the past 240 hours).   Labs: BNP (last 3 results) No results for input(s): BNP in the last 8760 hours. Basic Metabolic Panel: Recent Labs  Lab 02/21/24 1813 02/22/24 0500  NA 142 142  K 3.5 3.7  CL 101 102  CO2 26 29  GLUCOSE 93 94  BUN 11 13  CREATININE 1.07 0.99  CALCIUM  9.5 9.0  MG  --  2.1  PHOS  --  2.4*   Liver Function Tests: Recent Labs  Lab 02/21/24 1813 02/22/24 0500  AST 24 29  ALT 16 23  ALKPHOS 99 92  BILITOT 0.7 1.1  PROT 6.9 6.6  ALBUMIN 4.1 4.0   No results for input(s): LIPASE, AMYLASE in the last 168 hours. No results for input(s): AMMONIA in the last 168 hours. CBC: Recent Labs  Lab 02/21/24 1813 02/22/24 0500  WBC 7.8 8.6  HGB 16.1 16.2  HCT 47.5 47.4  MCV 89.1 88.4  PLT 274 249   Cardiac Enzymes: No results for input(s): CKTOTAL, CKMB, CKMBINDEX, TROPONINI in the last 168 hours. BNP: Invalid input(s): POCBNP CBG: Recent Labs  Lab 02/22/24 0012 02/22/24 1025  GLUCAP 99 87   D-Dimer No results for input(s): DDIMER in the last 72 hours. Hgb A1c No results for input(s): HGBA1C in the last 72 hours. Lipid Profile No results for input(s): CHOL, HDL, LDLCALC, TRIG, CHOLHDL, LDLDIRECT in the last 72 hours. Thyroid  function studies No results for input(s): TSH, T4TOTAL, T3FREE, THYROIDAB in the last 72 hours.  Invalid input(s): FREET3 Anemia work up No results for input(s): VITAMINB12, FOLATE, FERRITIN, TIBC, IRON, RETICCTPCT in the last 72 hours. Urinalysis    Component Value Date/Time   COLORURINE YELLOW 09/08/2021 1548   APPEARANCEUR CLEAR 09/08/2021 1548   LABSPEC 1.012 09/08/2021 1548   PHURINE 6.0 09/08/2021 1548   GLUCOSEU NEGATIVE 09/08/2021 1548   HGBUR NEGATIVE 09/08/2021 1548   BILIRUBINUR NEGATIVE 09/08/2021 1548   KETONESUR NEGATIVE 09/08/2021 1548    PROTEINUR NEGATIVE 09/08/2021 1548   NITRITE NEGATIVE 09/08/2021 1548   LEUKOCYTESUR NEGATIVE 09/08/2021 1548   Sepsis Labs Recent Labs  Lab 02/21/24 1813 02/22/24 0500  WBC 7.8 8.6   Microbiology No results found for this or any previous visit (from the past 240 hours).   Time coordinating discharge: 35 minutes  SIGNED:   Mazel Villela, MD  [facility] 02/22/2024, 5:30 PM      [  1]  Allergies Allergen Reactions   Lisinopril  Itching    headaches
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
## 2024-02-22 NOTE — Telephone Encounter (Signed)
 Previously seen in clinic by [facility] and primary GI MD was Dr. Eartha.  Has not seen Chelsea since 2023.  He was seen inpatient for rectal bleeding and underwent a colonoscopy.  Per Dr. Cinderella he needs a follow-up in the clinic in 1-2 months with Dr. Cinderella or myself.    Patient was also advised to follow up with [facility] for his surveillance of barretts and esophageal carcinoma.   Charmaine Melia, MSN, APRN, FNP-BC, AGACNP-BC [facility] at [facility]

## 2024-02-22 NOTE — Telephone Encounter (Signed)
 Recommend UC since I am out of the office.

## 2024-02-22 NOTE — Transfer of Care (Signed)
 Immediate Anesthesia Transfer of Care Note  Patient: Jerry Aguirre  Procedure(s) Performed: COLONOSCOPY COLONOSCOPY, WITH POLYPECTOMY  Patient Location: PACU  Anesthesia Type:MAC  Level of Consciousness: drowsy, patient cooperative, and responds to stimulation  Airway & Oxygen Therapy: Patient Spontanous Breathing  Post-op Assessment: Report given to RN and Post -op Vital signs reviewed and stable  Post vital signs: Reviewed and stable  Last Vitals:  Vitals Value Taken Time  BP 97/65 02/22/24 12:15  Temp 98.4   Pulse 81 02/22/24 12:15  Resp 21 02/22/24 12:15  SpO2 97 % 02/22/24 12:15  Vitals shown include unfiled device data.  Last Pain:  Vitals:   02/22/24 1147  TempSrc:   PainSc: 10-Worst pain ever         Complications: No notable events documented.

## 2024-02-22 NOTE — ED Notes (Signed)
BGL 99

## 2024-02-22 NOTE — Consult Note (Signed)
 "  Gastroenterology Consult   Referring Provider: No ref. provider found Primary Care Physician:  Bevely Doffing, FNP Primary Gastroenterologist:  Dr.  Patient ID: Jerry Aguirre; [id number]; 06/10/1965   Admit date: 02/21/2024  LOS: 0 days   Date of Consultation: 02/22/2024  Reason for Consultation:  rectal bleeding  History of Present Illness   Jerry Aguirre is a 59 y.o. year old male with history of COPD and sleep apnea using CPAP with 3 L oxygen at home, HTN, HLD, and prediabetes currently on Ozempic  with last dose Tuesday 2/3.  Reported a recent fall after being pulled down by a strap and at that time he started having left lower extremity pain along with rectal bleeding.  GI consulted for further evaluation of rectal bleeding.  ED Course: Labs - Hgb 16.1.  Normal CMP other than mildly elevated T. bili at 1.4.  A1c 6.1 CTA GI bleed scan with no evidence of active bleeding or any acute findings.  Consult:  Rectal bleeding started about 2 days ago and started after he was drug down by a strap that was hooked to a car. He thought maybe he jarred something in his abdomen leading to the bleeding. He states since he started the colon prep he has not had any further bleeding but did note several episodes prior to arrival. Denies NSAIDs. No alcohol intake. Does report chronic pain medication at home along with flexeril  and reports he sleeps with a CPAP machine with 3L O2 at night at home. States typical O2 sats are 86-92 at home on room air. Quit smoking in April 2025, previously 3 ppd. Denies any abdominal pain or constipation. Has 2 BM daily at home without straining. States he has had 3 prior colonoscopies. Mostly irate at first on arrival to room given he has not received any of his daily medications since being in the ED and admitted. He also does not want to wait until 5pm for his colonoscopy.   Colonoscopy December 2023: - One 8 mm polyp in the descending colon, removed with a cold snare.  Resected and retrieved.  - Diverticulosis in the sigmoid colon and in the descending colon. Biopsied.  - The distal rectum and anal verge are normal on retroflexion view.   Past Medical History:  Diagnosis Date   COPD (chronic obstructive pulmonary disease) (HCC)    Dyspnea    GERD (gastroesophageal reflux disease)    Headache    Hyperlipidemia    Hypertension    Sleep apnea    pt says his OSA was mild and they said he did not need a CPAP    Past Surgical History:  Procedure Laterality Date   BIOPSY  12/22/2021   Procedure: BIOPSY;  Surgeon: Eartha Angelia Sieving, MD;  Location: [facility];  Service: Gastroenterology;;   CATARACT EXTRACTION W/PHACO Left 06/25/2023   Procedure: PHACOEMULSIFICATION, CATARACT, WITH IOL INSERTION;  Surgeon: Harrie Agent, MD;  Location: [facility];  Service: Ophthalmology;  Laterality: Left;  CDE: 4.62   CATARACT EXTRACTION W/PHACO Right 07/09/2023   Procedure: PHACOEMULSIFICATION, CATARACT, WITH IOL INSERTION;  Surgeon: Harrie Agent, MD;  Location: [facility];  Service: Ophthalmology;  Laterality: Right;  CDE: 5.60   COLONOSCOPY WITH PROPOFOL  N/A 12/22/2021   Procedure: COLONOSCOPY WITH PROPOFOL ;  Surgeon: Eartha Angelia Sieving, MD;  Location: [facility];  Service: Gastroenterology;  Laterality: N/A;  10:15am, asa 3   ESOPHAGOGASTRODUODENOSCOPY (EGD) WITH PROPOFOL  N/A 12/22/2021   Procedure: ESOPHAGOGASTRODUODENOSCOPY (EGD) WITH PROPOFOL ;  Surgeon: Eartha Angelia Sieving, MD;  Location: [facility];  Service: Gastroenterology;  Laterality: N/A;   HERNIA REPAIR Right    inguinal   LUMBAR LAMINECTOMY/DECOMPRESSION MICRODISCECTOMY N/A 04/21/2021   Procedure: LAMINECTOMY, MEDIAL FACETECTOMY LUMBAR FOUR- FIVE;  Surgeon: Debby Dorn MATSU, MD;  Location: [facility];  Service: Neurosurgery;  Laterality: N/A;   POLYPECTOMY  12/22/2021   Procedure: POLYPECTOMY;  Surgeon: Eartha Angelia Sieving, MD;  Location: [facility];  Service: Gastroenterology;;    TONSILLECTOMY     removed as a child    Prior to Admission medications  Medication Sig Start Date End Date Taking? Authorizing Provider  ACCU-CHEK GUIDE TEST test strip TEST EVERY DAY 05/03/23   Melvenia Manus BRAVO, MD  Accu-Chek Softclix Lancets lancets USE AS DIRECTED TO CHECK BLOOD SUGAR IN THE MORNING, AT NOON, AND AT BEDTIME. 04/27/22   Melvenia Manus BRAVO, MD  acetaminophen  (TYLENOL ) 325 MG tablet Take 2 tablets (650 mg total) by mouth every 6 (six) hours as needed for mild pain (pain score 1-3) (or Fever >/= 101). 06/02/23   Pearlean Manus, MD  albuterol  (PROVENTIL ) (2.5 MG/3ML) 0.083% nebulizer solution Take 3 mLs (2.5 mg total) by nebulization every 2 (two) hours as needed for wheezing or shortness of breath. 06/02/23   Pearlean Manus, MD  albuterol  (VENTOLIN  HFA) 108 (90 Base) MCG/ACT inhaler Inhale 2 puffs into the lungs every 4 (four) hours as needed for wheezing or shortness of breath. 06/02/23   Pearlean Manus, MD  aspirin  EC 81 MG tablet Take 1 tablet (81 mg total) by mouth daily with breakfast. Swallow whole. 06/03/23   Pearlean Manus, MD  atorvastatin  (LIPITOR) 80 MG tablet Take 1 tablet (80 mg total) by mouth daily. 01/24/24   Bevely Doffing, FNP  baclofen  (LIORESAL ) 10 MG tablet Take 1 tablet (10 mg total) by mouth 2 (two) times daily as needed. 06/20/23   Bevely Doffing, FNP  Blood Glucose Monitoring Suppl DEVI 1 each by Does not apply route in the morning, at noon, and at bedtime. May substitute to any manufacturer covered by patient's insurance. 04/27/22   Melvenia Manus BRAVO, MD  cetirizine  (ZYRTEC  ALLERGY) 10 MG tablet Take 1 tablet (10 mg total) by mouth daily. 07/18/23   Stuart Vernell Norris, PA-C  cetirizine  (ZYRTEC ) 10 MG tablet Take 1 tablet (10 mg total) by mouth daily. 05/14/23   Bevely Doffing, FNP  clobetasol  ointment (TEMOVATE ) 0.05 % Apply 1 Application topically 2 (two) times daily. 06/27/23   Christine Rush, DPM  cyclobenzaprine  (FLEXERIL ) 10 MG tablet Take 1 tablet (10 mg  total) by mouth 3 (three) times daily as needed for muscle spasms. 10/31/23   Bevely Doffing, FNP  Elastic Bandages & Supports (MEDICAL COMPRESSION STOCKINGS) MISC 2 each by Does not apply route daily. 11/23/21   Melvenia Manus BRAVO, MD  furosemide  (LASIX ) 20 MG tablet Take 1 tablet (20 mg total) by mouth daily as needed for fluid or edema. TAKE (1) TABLET BY MOUTH   DAILY AS NEEDED FOR FLUID OR EDEMA 06/02/23   Pearlean Manus, MD  furosemide  (LASIX ) 40 MG tablet TAKE (1) TABLET BY MOUTH THREE TIMES DAILY AS NEEDED FOR FLUID OR EDEMA 01/11/24   Bevely Doffing, FNP  lactulose  (CHRONULAC ) 10 GM/15ML solution Take 10 g by mouth at bedtime. 03/21/21   [provider]  meloxicam  (MOBIC ) 7.5 MG tablet Take 1 tablet (7.5 mg total) by mouth 2 (two) times daily. 11/09/21   Dixon, Phillip E, MD  Menthol -Zinc Oxide (GOLD BOND EX) Apply 1 Application topically daily as needed (  dry skin).    [provider]  metFORMIN  (GLUCOPHAGE -XR) 500 MG 24 hr tablet Take 1 tablet (500 mg total) by mouth daily with breakfast. 06/02/23   Pearlean Manus, MD  metoprolol  succinate (TOPROL -XL) 25 MG 24 hr tablet Take 1 tablet (25 mg total) by mouth daily. 12/20/23   Bevely Doffing, FNP  Nebulizers (COMPRESSOR/NEBULIZER) MISC 1 Units by Does not apply route daily as needed (Cough, wheezing or shortness of breath). Nebulizer machine with supplies and tubing 06/02/23   Emokpae, Courage, MD  ondansetron  (ZOFRAN ) 4 MG tablet Take 1 tablet (4 mg total) by mouth 2 (two) times daily. 01/31/24   Bevely Doffing, FNP  oxyCODONE -acetaminophen  (PERCOCET) 10-325 MG tablet TAKE 1 TABLET EVERY 4-6 HOURS. MAX 5 PER DAY FOR 30 DAYS Patient taking differently: Take 1 tablet by mouth every 4 (four) hours as needed for pain. 06/05/22   Melvenia Manus BRAVO, MD  OXYCONTIN  10 MG 12 hr tablet Take 10 mg by mouth at bedtime. 04/02/23   [provider]  OZEMPIC , 1 MG/DOSE, 4 MG/3ML SOPN INJECT 1MG  INTO SKIN ONCE WEEKLY 01/10/24   Bevely Doffing,  FNP  pantoprazole  (PROTONIX ) 40 MG tablet Take 1 tablet (40 mg total) by mouth 2 (two) times daily. 06/20/23   Bevely Doffing, FNP  potassium chloride  (KLOR-CON ) 10 MEQ tablet Take 1 tablet (10 mEq total) by mouth daily. Take While taking Triamterene - HCTZ 06/02/23   Pearlean Manus, MD  pramipexole  (MIRAPEX ) 0.75 MG tablet Take 1-2 tablets (0.75-1.5 mg total) by mouth See admin instructions. Take 0.75 mg in the morning and 1.5 mg at night 05/14/23   Bevely Doffing, FNP  pregabalin  (LYRICA ) 150 MG capsule TAKE ONE CAPSULE BY MOUTH DAILY AS NEEDED 11/26/23   Bevely Doffing, FNP  Simethicone  125 MG CAPS Take 1 capsule (125 mg total) by mouth every 6 (six) hours as needed (bloating). 12/22/21   Eartha Angelia Sieving, MD  tamsulosin  (FLOMAX ) 0.4 MG CAPS capsule Take 1 capsule (0.4 mg total) by mouth daily. 11/23/23   Bevely Doffing, FNP  terbinafine  (LAMISIL ) 250 MG tablet Take 1 tablet (250 mg total) by mouth daily. 06/27/23   Christine Rush, DPM  triamterene -hydrochlorothiazide  (DYAZIDE ) 37.5-25 MG capsule Take 1 each (1 capsule total) by mouth daily. 01/08/24   Bevely Doffing, FNP  Vitamin D , Ergocalciferol , (DRISDOL ) 1.25 MG (50000 UNIT) CAPS capsule TAKE ONE CAPSULE BY MOUTH EVERY 7 DAYS 12/24/23   Bevely Doffing, FNP  zolpidem  (AMBIEN ) 10 MG tablet Take 10 mg by mouth at bedtime. 04/18/21   [provider]    Current Facility-Administered Medications  Medication Dose Route Frequency Provider Last Rate Last Admin   acetaminophen  (TYLENOL ) tablet 650 mg  650 mg Oral Q6H PRN Adefeso, Oladapo, DO       Or   acetaminophen  (TYLENOL ) suppository 650 mg  650 mg Rectal Q6H PRN Adefeso, Oladapo, DO       insulin  aspart (novoLOG ) injection 0-9 Units  0-9 Units Subcutaneous Q4H Adefeso, Oladapo, DO       ondansetron  (ZOFRAN ) tablet 4 mg  4 mg Oral Q6H PRN Adefeso, Oladapo, DO       Or   ondansetron  (ZOFRAN ) injection 4 mg  4 mg Intravenous Q6H PRN Adefeso, Oladapo, DO       pantoprazole  (PROTONIX )  injection 40 mg  40 mg Intravenous Q24H Adefeso, Oladapo, DO   40 mg at 02/21/24 2215   terbinafine  (LAMISIL ) tablet 250 mg  250 mg Oral Daily        Current  Outpatient Medications  Medication Sig Dispense Refill   ACCU-CHEK GUIDE TEST test strip TEST EVERY DAY 100 strip 12   Accu-Chek Softclix Lancets lancets USE AS DIRECTED TO CHECK BLOOD SUGAR IN THE MORNING, AT NOON, AND AT BEDTIME. 100 each 0   acetaminophen  (TYLENOL ) 325 MG tablet Take 2 tablets (650 mg total) by mouth every 6 (six) hours as needed for mild pain (pain score 1-3) (or Fever >/= 101).     albuterol  (PROVENTIL ) (2.5 MG/3ML) 0.083% nebulizer solution Take 3 mLs (2.5 mg total) by nebulization every 2 (two) hours as needed for wheezing or shortness of breath. 75 mL 12   albuterol  (VENTOLIN  HFA) 108 (90 Base) MCG/ACT inhaler Inhale 2 puffs into the lungs every 4 (four) hours as needed for wheezing or shortness of breath. 8 g 5   aspirin  EC 81 MG tablet Take 1 tablet (81 mg total) by mouth daily with breakfast. Swallow whole. 30 tablet 12   atorvastatin  (LIPITOR) 80 MG tablet Take 1 tablet (80 mg total) by mouth daily. 90 tablet 3   baclofen  (LIORESAL ) 10 MG tablet Take 1 tablet (10 mg total) by mouth 2 (two) times daily as needed. 60 tablet 5   Blood Glucose Monitoring Suppl DEVI 1 each by Does not apply route in the morning, at noon, and at bedtime. May substitute to any manufacturer covered by patient's insurance. 1 each 0   cetirizine  (ZYRTEC  ALLERGY) 10 MG tablet Take 1 tablet (10 mg total) by mouth daily. 20 tablet 0   cetirizine  (ZYRTEC ) 10 MG tablet Take 1 tablet (10 mg total) by mouth daily. 30 tablet 11   clobetasol  ointment (TEMOVATE ) 0.05 % Apply 1 Application topically 2 (two) times daily. 60 g 1   cyclobenzaprine  (FLEXERIL ) 10 MG tablet Take 1 tablet (10 mg total) by mouth 3 (three) times daily as needed for muscle spasms. 30 tablet 5   Elastic Bandages & Supports (MEDICAL COMPRESSION STOCKINGS) MISC 2 each by Does not  apply route daily. 2 each 0   furosemide  (LASIX ) 20 MG tablet Take 1 tablet (20 mg total) by mouth daily as needed for fluid or edema. TAKE (1) TABLET BY MOUTH   DAILY AS NEEDED FOR FLUID OR EDEMA 30 tablet 0   furosemide  (LASIX ) 40 MG tablet TAKE (1) TABLET BY MOUTH THREE TIMES DAILY AS NEEDED FOR FLUID OR EDEMA 90 tablet 2   lactulose  (CHRONULAC ) 10 GM/15ML solution Take 10 g by mouth at bedtime.     meloxicam  (MOBIC ) 7.5 MG tablet Take 1 tablet (7.5 mg total) by mouth 2 (two) times daily. 60 tablet 3   Menthol -Zinc Oxide (GOLD BOND EX) Apply 1 Application topically daily as needed (dry skin).     metFORMIN  (GLUCOPHAGE -XR) 500 MG 24 hr tablet Take 1 tablet (500 mg total) by mouth daily with breakfast. 30 tablet 5   metoprolol  succinate (TOPROL -XL) 25 MG 24 hr tablet Take 1 tablet (25 mg total) by mouth daily. 30 tablet 5   Nebulizers (COMPRESSOR/NEBULIZER) MISC 1 Units by Does not apply route daily as needed (Cough, wheezing or shortness of breath). Nebulizer machine with supplies and tubing 1 each 0   ondansetron  (ZOFRAN ) 4 MG tablet Take 1 tablet (4 mg total) by mouth 2 (two) times daily. 20 tablet 5   oxyCODONE -acetaminophen  (PERCOCET) 10-325 MG tablet TAKE 1 TABLET EVERY 4-6 HOURS. MAX 5 PER DAY FOR 30 DAYS (Patient taking differently: Take 1 tablet by mouth every 4 (four) hours as needed for pain.) 120 tablet  0   OXYCONTIN  10 MG 12 hr tablet Take 10 mg by mouth at bedtime.     OZEMPIC , 1 MG/DOSE, 4 MG/3ML SOPN INJECT 1MG  INTO SKIN ONCE WEEKLY 3 mL 2   pantoprazole  (PROTONIX ) 40 MG tablet Take 1 tablet (40 mg total) by mouth 2 (two) times daily. 60 tablet 5   potassium chloride  (KLOR-CON ) 10 MEQ tablet Take 1 tablet (10 mEq total) by mouth daily. Take While taking Triamterene - HCTZ 30 tablet 2   pramipexole  (MIRAPEX ) 0.75 MG tablet Take 1-2 tablets (0.75-1.5 mg total) by mouth See admin instructions. Take 0.75 mg in the morning and 1.5 mg at night 90 tablet 5   pregabalin  (LYRICA ) 150 MG  capsule TAKE ONE CAPSULE BY MOUTH DAILY AS NEEDED 30 capsule 2   Simethicone  125 MG CAPS Take 1 capsule (125 mg total) by mouth every 6 (six) hours as needed (bloating). 180 capsule 1   tamsulosin  (FLOMAX ) 0.4 MG CAPS capsule Take 1 capsule (0.4 mg total) by mouth daily. 30 capsule 5   terbinafine  (LAMISIL ) 250 MG tablet Take 1 tablet (250 mg total) by mouth daily. 30 tablet 0   triamterene -hydrochlorothiazide  (DYAZIDE ) 37.5-25 MG capsule Take 1 each (1 capsule total) by mouth daily. 30 capsule 5   Vitamin D , Ergocalciferol , (DRISDOL ) 1.25 MG (50000 UNIT) CAPS capsule TAKE ONE CAPSULE BY MOUTH EVERY 7 DAYS 12 capsule 3   zolpidem  (AMBIEN ) 10 MG tablet Take 10 mg by mouth at bedtime.      Allergies as of 02/21/2024 - Review Complete 02/21/2024  Allergen Reaction Noted   Lisinopril  Itching 02/13/2018    Family History  Problem Relation Age of Onset   Esophageal cancer Mother    Lung cancer Father     Social History   Socioeconomic History   Marital status: Single    Spouse name: Not on file   Number of children: 2   Years of education: Not on file   Highest education level: 9th grade  Occupational History   Not on file  Tobacco Use   Smoking status: Former    Current packs/day: 1.00    Types: Cigarettes    Passive exposure: Past   Smokeless tobacco: Never   Tobacco comments:    Less than 1 pack per day  Vaping Use   Vaping status: Never Used  Substance and Sexual Activity   Alcohol use: Not Currently   Drug use: Yes    Types: Marijuana    Comment: twice a week   Sexual activity: Not on file  Other Topics Concern   Not on file  Social History Narrative   Not on file   Social Drivers of Health   Tobacco Use: Medium Risk (02/21/2024)   Patient History    Smoking Tobacco Use: Former    Smokeless Tobacco Use: Never    Passive Exposure: Past  Physicist, Medical Strain: Low Risk (10/08/2023)   Overall Financial Resource Strain (CARDIA)    Difficulty of Paying Living  Expenses: Not hard at all  Food Insecurity: Food Insecurity Present (10/08/2023)   Epic    Worried About Programme Researcher, Broadcasting/film/video in the Last Year: Sometimes true    Ran Out of Food in the Last Year: Sometimes true  Transportation Needs: No Transportation Needs (10/08/2023)   Epic    Lack of Transportation (Medical): No    Lack of Transportation (Non-Medical): No  Physical Activity: Insufficiently Active (10/08/2023)   Exercise Vital Sign    Days of Exercise per Week:  7 days    Minutes of Exercise per Session: 10 min  Stress: No Stress Concern Present (10/08/2023)   [employer] of Occupational Health - Occupational Stress Questionnaire    Feeling of Stress: Not at all  Social Connections: Socially Isolated (10/08/2023)   Social Connection and Isolation Panel    Frequency of Communication with Friends and Family: More than three times a week    Frequency of Social Gatherings with Friends and Family: More than three times a week    Attends Religious Services: Never    Database Administrator or Organizations: No    Attends Engineer, Structural: Not on file    Marital Status: Divorced  Intimate Partner Violence: Not At Risk (06/01/2023)   Humiliation, Afraid, Rape, and Kick questionnaire    Fear of Current or Ex-Partner: No    Emotionally Abused: No    Physically Abused: No    Sexually Abused: No  Depression (PHQ2-9): Low Risk (06/20/2023)   Depression (PHQ2-9)    PHQ-2 Score: 0  Alcohol Screen: Not on file  Housing: Low Risk (10/08/2023)   Epic    Unable to Pay for Housing in the Last Year: No    Number of Times Moved in the Last Year: 0    Homeless in the Last Year: No  Utilities: Not At Risk (06/01/2023)   [facility] Utilities    Threatened with loss of utilities: No  Health Literacy: Not on file     Review of Systems   Gen: Denies any fever, chills, loss of appetite, change in weight or weight loss CV: Denies chest pain, heart palpitations, syncope, edema  Resp: + Dyspnea  on exertion, + wheezing.  Denies cough, coughing up blood, and pleurisy. GI: See HPI MS: + Chronic back pain, + LLE pain.  Denies limitation of movement, swelling, cramps, and atrophy.  Derm: Denies rash, itching, dry skin, hives. Psych: Denies depression, anxiety, memory loss, hallucinations, and confusion. Heme: + Rectal bleeding.  Denies bruising or bleeding Neuro: + headache.  Denies any dizziness, paresthesias, shaking  Physical Exam   Vital Signs in last 24 hours: Temp:  [97.8 F (36.6 C)-98.6 F (37 C)] 97.8 F (36.6 C) (02/06 0341) Pulse Rate:  [49-88] 64 (02/06 0645) Resp:  [18-24] 20 (02/06 0645) BP: (100-147)/(64-93) 129/89 (02/06 0645) SpO2:  [87 %-97 %] 96 % (02/06 0645) FiO2 (%):  [18 %-21 %] 18 % (02/06 0100) Weight:  [72.6 kg] 72.6 kg (02/05 1635)    General:   Alert,  Well-developed, well-nourished, pleasant and cooperative in NAD Head:  Normocephalic and atraumatic. Eyes:  Sclera clear, no icterus.   Conjunctiva pink. Ears:  Normal auditory acuity. Neck:  Supple; no masses Lungs: Bilateral expiratory wheezing. Heart:  Regular rate and rhythm; no murmurs, clicks, rubs,  or gallops. Abdomen:  Soft, nontender and nondistended, rounded.  No masses, hepatosplenomegaly or hernias noted. Normal bowel sounds, without guarding, and without rebound.   Rectal: deferred   Msk:  Symmetrical without gross deformities. Normal posture. Extremities:  Without clubbing or edema.  Neurologic:  Alert and  oriented x4. Skin:  Intact without significant lesions or rashes.  Area of redness to the anterior aspect of the left lower extremity with ecchymosis. Psych:  Alert and cooperative. Normal mood and affect.  Intake/Output from previous day: No intake/output data recorded. Intake/Output this shift: No intake/output data recorded.   Labs/Studies   Recent Labs Recent Labs    02/21/24 1813 02/22/24 0500  WBC 7.8 8.6  HGB 16.1 16.2  HCT 47.5 47.4  PLT 274 249    BMET Recent Labs    02/21/24 1813 02/22/24 0500  NA 142 142  K 3.5 3.7  CL 101 102  CO2 26 29  GLUCOSE 93 94  BUN 11 13  CREATININE 1.07 0.99  CALCIUM  9.5 9.0   LFT Recent Labs    02/21/24 1813 02/22/24 0500  PROT 6.9 6.6  ALBUMIN 4.1 4.0  AST 24 29  ALT 16 23  ALKPHOS 99 92  BILITOT 0.7 1.1   PT/INR Recent Labs    02/21/24 1813  LABPROT 12.3  INR 0.9   Hepatitis Panel No results for input(s): HEPBSAG, HCVAB, HEPAIGM, HEPBIGM in the last 72 hours. C-Diff No results for input(s): CDIFFTOX in the last 72 hours.  Radiology/Studies CT ANGIO GI BLEED Result Date: 02/21/2024 EXAM: CTA ABDOMEN AND PELVIS WITH CONTRAST 02/21/2024 07:12:11 PM TECHNIQUE: CTA images of the abdomen and pelvis with intravenous contrast. Three-dimensional MIP/volume rendered formations were performed. Automated exposure control, iterative reconstruction, and/or weight based adjustment of the mA/kV was utilized to reduce the radiation dose to as low as reasonably achievable. COMPARISON: None available. CLINICAL HISTORY: lower gi bleed Lower GI bleed. FINDINGS: VASCULATURE: GI BLEED: No active extravasation of contrast within the GI tract. AORTA: Atherosclerotic calcifications of the abdominal aorta. No acute finding. No abdominal aortic aneurysm. No dissection. CELIAC TRUNK: No acute finding. No occlusion or significant stenosis. SUPERIOR MESENTERIC ARTERY: No acute finding. No occlusion or significant stenosis. INFERIOR MESENTERIC ARTERY: No acute finding. No occlusion or significant stenosis. RENAL ARTERIES: No acute finding. No occlusion or significant stenosis. ILIAC ARTERIES: No acute finding. No occlusion or significant stenosis. ABDOMEN/PELVIS: LOWER CHEST: Visualized portion of the lower chest demonstrates no acute abnormality. LIVER: Calcified hepatic granulomata, benign. The liver is otherwise unremarkable. GALLBLADDER AND BILE DUCTS: Gallbladder is unremarkable. No biliary ductal  dilatation. SPLEEN: Calcified splenic granulomata, benign. The spleen is otherwise unremarkable. PANCREAS: The pancreas is unremarkable. ADRENAL GLANDS: Bilateral adrenal glands demonstrate no acute abnormality. KIDNEYS, URETERS AND BLADDER: No stones in the kidneys or ureters. No hydronephrosis. No perinephric or periureteral stranding. Urinary bladder is unremarkable. GI AND BOWEL: Normal appendix (image 119). Stomach and duodenal sweep demonstrate no acute abnormality. There is no bowel obstruction. No abnormal bowel wall thickening or distension. REPRODUCTIVE: Prostate is notable for dystrophic calcifications. Reproductive organs are otherwise unremarkable. PERITONEUM AND RETROPERITONEUM: No ascites or free air. LYMPH NODES: No lymphadenopathy. BONES AND SOFT TISSUES: Mild degenerative changes of the lumbar spine. No acute soft tissue abnormality. IMPRESSION: 1. No active GI bleeding. 2. No acute findings. 3. Aortic Atherosclerosis (ICD10-I70.0). Electronically signed by: Pinkie Pebbles MD 02/21/2024 07:22 PM EST RP Workstation: [id number]   DG Knee Complete 4 Views Left Result Date: 02/21/2024 EXAM: 4 VIEW(S) XRAY OF THE LEFT KNEE 02/21/2024 06:02:00 PM COMPARISON: 2 / 28 / 22 CLINICAL HISTORY: Injury. FINDINGS: BONES AND JOINTS: No acute fracture. No malalignment. No significant joint effusion. SOFT TISSUES: Unremarkable. IMPRESSION: 1. No evidence of acute traumatic injury. Electronically signed by: Norman Gatlin MD 02/21/2024 06:07 PM EST RP Workstation: [id number]     Assessment   Jerry Aguirre is a 59 y.o. year old male with history of COPD and sleep apnea using CPAP with 3 L oxygen at home, HTN, HLD, and prediabetes currently on Ozempic  with last dose Tuesday 2/3 who presented to the ED with complaints of rectal bleeding.  GI consulted for further evaluation.   Rectal bleeding: Patient presenting with multiple episodes  of rectal bleeding over 2 days.  Denies any abdominal pain, melena, or  clots.  States no further rectal bleeding since he started colonoscopy prep.  Denied any alcohol use or NSAIDs but does take chronic pain medication at home.  States he was concerned that he possibly jarred something in his abdomen which caused a rectal bleeding given he had a recent fall where he was pulled down to the ground from a strap attached to a vehicle.  CT angio without any concerns of active bleeding or abnormal structures.  Last colonoscopy in 2023 with evidence of diverticulosis and removal of a polyp.  Denies any concerns of constipation or history of hemorrhoids.  Given unknown etiology of rectal bleeding and ongoing symptoms, despite stable hemoglobin he was recommended to undergo colonoscopy.  Differentials include bleeding polyps, diverticular bleed, angioectasias/AVMs, and less likely malignancy or ischemic hepatitis in the absence of abdominal pain.   I have discussed the risks, alternatives, benefits with regards to but not limited to the risk of reaction to medication, bleeding, infection, perforation and the patient is agreeable to proceed. Written consent to be obtained.  Patient agreed to undergo colonoscopy after conversations about hospice starting his home medications and completing colonoscopy early today.  Family at bedside and updated as well.  Plan / Recommendations   N.p.o. Resume home meds Resume home PPI Colonoscopy today with Dr. Cinderella     02/22/2024, 9:26 AM  Charmaine Melia, MSN, FNP-BC, AGACNP-BC [facility]   "

## 2024-02-22 NOTE — Telephone Encounter (Signed)
 Pt been at the ER and spent the night Can we get him in for a ER follow up Earliest available

## 2024-02-22 NOTE — ED Notes (Signed)
 3L O2 [place] administered.

## 2024-02-22 NOTE — Brief Op Note (Signed)
 Patient underwent Colonoscopy  under propofol  sedation.  Tolerated the procedure adequately.   FINDINGS:  - Preparation of the colon was fair. - The examined portion of the ileum was normal. - Diverticulosis in the sigmoid colon. - One 4 mm polyp in the ascending colon, removed with a cold snare. Resected and retrieved. - Non- bleeding internal hemorrhoids.   There is no evidence of active or old bleeding throughout the examined colon, this was likley self- limiting diverticular bleed  RECOMMENDATIONS  - Await pathology results.  - Advance diet as tolerated.  - Continue present medications.  - Bowel prep was adequate for the indication procedure was done ( GI Bleed) but not for surveillance of polyp: Suggest repeat colonoscopy interval to be determined from the last screening colonoscopy performed ( 7 years from 2023)  - Patient verbalizes understanding that he will follow up with Dr Mabel Larger at [facility] for Barretts surveillance until cleared to have procedure done locally   [place] Faizan Britain Saber, MD Gastroenterology and Hepatology [facility]

## 2024-02-22 NOTE — Op Note (Signed)
 [facility] Patient Name: Jerry Aguirre Procedure Date: 02/22/2024 11:38 AM MRN: [id number] Date of Birth: [birth year] Attending MD: Deatrice Dine , MD, [id number] CSN: [id number] Age: 59 Admit Type: Outpatient Procedure:                Colonoscopy Indications:              Hematochezia Providers:                Deatrice Dine, MD, Charlena Edison, RN, Rosina Sprague Referring MD:              Medicines:                Monitored Anesthesia Care Complications:            No immediate complications. Estimated Blood Loss:     Estimated blood loss was minimal. Procedure:                Pre-Anesthesia Assessment:                           - Prior to the procedure, a History and Physical                            was performed, and patient medications and                            allergies were reviewed. The patient's tolerance of                            previous anesthesia was also reviewed. The risks                            and benefits of the procedure and the sedation                            options and risks were discussed with the patient.                            All questions were answered, and informed consent                            was obtained. Prior Anticoagulants: The patient has                            taken no anticoagulant or antiplatelet agents                            except for aspirin . ASA Grade Assessment: III - A                            patient with severe systemic disease. After                            reviewing the risks and benefits, the patient was  deemed in satisfactory condition to undergo the                            procedure.                           After obtaining informed consent, the colonoscope                            was passed under direct vision. Throughout the                            procedure, the patient's blood pressure, pulse, and                            oxygen saturations were  monitored continuously. The                            [id number]-[id number] ([id number]) Peds Colon was introduced                            through the anus and advanced to the the terminal                            ileum. The colonoscopy was performed without                            difficulty. The patient tolerated the procedure                            well. The quality of the bowel preparation was                            fair. The terminal ileum, ileocecal valve,                            appendiceal orifice, and rectum were photographed. Scope In: 11:54:49 AM Scope Out: 12:10:47 PM Scope Withdrawal Time: 0 hours 12 minutes 51 seconds  Total Procedure Duration: 0 hours 15 minutes 58 seconds  Findings:      The perianal and digital rectal examinations were normal.      The terminal ileum appeared normal.      There is no endoscopic evidence of bleeding in the entire colon.      A few small-mouthed diverticula were found in the sigmoid colon.      A 4 mm polyp was found in the ascending colon. The polyp was sessile.       The polyp was removed with a cold snare. Resection and retrieval were       complete.      Non-bleeding internal hemorrhoids were found during retroflexion. The       hemorrhoids were small. Impression:               - Preparation of the colon was fair.                           -  The examined portion of the ileum was normal.                           - Diverticulosis in the sigmoid colon.                           - One 4 mm polyp in the ascending colon, removed                            with a cold snare. Resected and retrieved.                           - Non-bleeding internal hemorrhoids.                           There is no evidence of active or old bleeding                            throughout the examined colon, this was likley                            self-limiting diverticular bleed Moderate Sedation:      Per Anesthesia Care Recommendation:            - Await pathology results.                           - Advance diet as tolerated.                           - Continue present medications.                           -Bowel prep was adequate for the indication                            procedure was done ( GI Bleed) but not for                            surveillance of polyp: Suggest repeat colonoscopy                            interval to be determined from the last screening                            colonoscopy performed (7 years from 2023)                           -Patient verbalizes understanding that he will                            follow up with Dr Jerry Aguirre at [facility] for                            Barretts surveillance until cleared to have  procedure done locally                           Continue PPI Procedure Code(s):        --- Professional ---                           [phone], Colonoscopy, flexible; with removal of                            tumor(s), polyp(s), or other lesion(s) by snare                            technique Diagnosis Code(s):        --- Professional ---                           K64.8, Other hemorrhoids                           D12.2, Benign neoplasm of ascending colon                           K92.1, Melena (includes Hematochezia)                           K57.30, Diverticulosis of large intestine without                            perforation or abscess without bleeding CPT copyright 2022 [employer]. All rights reserved. The codes documented in this report are preliminary and upon coder review may  be revised to meet current compliance requirements. Deatrice Dine, MD Deatrice Dine, MD 02/22/2024 12:24:11 PM This report has been signed electronically. Number of Addenda: 0

## 2024-02-22 NOTE — Progress Notes (Signed)
 On arrival to PACU patient wheezing and work of breathing, Anesthesia aware, Duo neb administered with improvement of breath sounds and work of breathing, lungs clear, effort regular

## 2024-02-22 NOTE — Anesthesia Postprocedure Evaluation (Signed)
"   Anesthesia Post Note  Patient: Jerry Aguirre  Procedure(s) Performed: COLONOSCOPY COLONOSCOPY, WITH POLYPECTOMY  Patient location during evaluation: Phase II Anesthesia Type: MAC Level of consciousness: awake Pain management: pain level controlled Vital Signs Assessment: post-procedure vital signs reviewed and stable Respiratory status: spontaneous breathing and respiratory function stable Cardiovascular status: blood pressure returned to baseline and stable Postop Assessment: no headache and no apparent nausea or vomiting Anesthetic complications: no Comments: Late entry   No notable events documented.   Last Vitals:  Vitals:   02/22/24 1230 02/22/24 1245  BP: 98/69 117/73  Pulse: 74 67  Resp: (!) 24 19  Temp:    SpO2: 98% 100%    Last Pain:  Vitals:   02/22/24 1230  TempSrc:   PainSc: 0-No pain                 Yvonna PARAS Sosha Shepherd      "

## 2024-02-22 NOTE — Anesthesia Preprocedure Evaluation (Signed)
"                                    Anesthesia Evaluation  Patient identified by MRN, date of birth, ID band Patient awake    Reviewed: Allergy & Precautions, H&P , NPO status , Patient's Chart, lab work & pertinent test results, reviewed documented beta blocker date and time   Airway Mallampati: II  TM Distance: >3 FB Neck ROM: full    Dental no notable dental hx.    Pulmonary shortness of breath, sleep apnea , COPD, former smoker   Pulmonary exam normal breath sounds clear to auscultation       Cardiovascular Exercise Tolerance: Good hypertension, + DOE   Rhythm:regular Rate:Normal     Neuro/Psych  Headaches  Neuromuscular disease CVA  negative psych ROS   GI/Hepatic Neg liver ROS,GERD  ,,  Endo/Other  diabetes    Renal/GU negative Renal ROS  negative genitourinary   Musculoskeletal   Abdominal   Peds  Hematology negative hematology ROS (+)   Anesthesia Other Findings   Reproductive/Obstetrics negative OB ROS                              Anesthesia Physical Anesthesia Plan  ASA: 3  Anesthesia Plan: MAC   Post-op Pain Management:    Induction:   PONV Risk Score and Plan: Propofol  infusion  Airway Management Planned:   Additional Equipment:   Intra-op Plan:   Post-operative Plan:   Informed Consent: I have reviewed the patients History and Physical, chart, labs and discussed the procedure including the risks, benefits and alternatives for the proposed anesthesia with the patient or authorized representative who has indicated his/her understanding and acceptance.     Dental Advisory Given  Plan Discussed with: CRNA  Anesthesia Plan Comments:         Anesthesia Quick Evaluation  "

## 2024-03-05 ENCOUNTER — Ambulatory Visit: Payer: Self-pay

## 2024-04-09 ENCOUNTER — Ambulatory Visit
